# Patient Record
Sex: Female | Born: 1970 | Race: White | Hispanic: No | Smoking: Former smoker
Health system: Southern US, Community
[De-identification: ages and names within clinical notes are randomized; demographics above are authoritative.]

## PROBLEM LIST (undated history)

## (undated) DIAGNOSIS — J449 Chronic obstructive pulmonary disease, unspecified: Secondary | ICD-10-CM

## (undated) DIAGNOSIS — J45909 Unspecified asthma, uncomplicated: Secondary | ICD-10-CM

## (undated) DIAGNOSIS — F32A Depression, unspecified: Secondary | ICD-10-CM

## (undated) DIAGNOSIS — Z789 Other specified health status: Secondary | ICD-10-CM

## (undated) DIAGNOSIS — Z720 Tobacco use: Secondary | ICD-10-CM

## (undated) DIAGNOSIS — J189 Pneumonia, unspecified organism: Secondary | ICD-10-CM

## (undated) DIAGNOSIS — G4730 Sleep apnea, unspecified: Secondary | ICD-10-CM

## (undated) DIAGNOSIS — R0600 Dyspnea, unspecified: Secondary | ICD-10-CM

## (undated) HISTORY — PX: WISDOM TOOTH EXTRACTION: SHX21

## (undated) HISTORY — DX: Tobacco use: Z72.0

---

## 1998-04-21 ENCOUNTER — Other Ambulatory Visit: Admission: RE | Admit: 1998-04-21 | Discharge: 1998-04-21 | Payer: Self-pay | Admitting: Obstetrics

## 1998-04-21 ENCOUNTER — Ambulatory Visit (HOSPITAL_COMMUNITY): Admission: RE | Admit: 1998-04-21 | Discharge: 1998-04-21 | Payer: Self-pay | Admitting: Obstetrics

## 1998-08-03 ENCOUNTER — Inpatient Hospital Stay (HOSPITAL_COMMUNITY): Admission: AD | Admit: 1998-08-03 | Discharge: 1998-08-03 | Payer: Self-pay | Admitting: Obstetrics

## 1998-10-19 ENCOUNTER — Inpatient Hospital Stay (HOSPITAL_COMMUNITY): Admission: AD | Admit: 1998-10-19 | Discharge: 1998-10-21 | Payer: Self-pay | Admitting: Obstetrics

## 1999-12-06 ENCOUNTER — Other Ambulatory Visit: Admission: RE | Admit: 1999-12-06 | Discharge: 1999-12-06 | Payer: Self-pay | Admitting: Obstetrics

## 2000-04-06 ENCOUNTER — Observation Stay (HOSPITAL_COMMUNITY): Admission: EM | Admit: 2000-04-06 | Discharge: 2000-04-07 | Payer: Self-pay | Admitting: Psychiatry

## 2013-12-01 ENCOUNTER — Emergency Department (HOSPITAL_COMMUNITY)
Admission: EM | Admit: 2013-12-01 | Discharge: 2013-12-01 | Disposition: A | Discharge status: Home / Self Care | Payer: Medicaid Other | Attending: Emergency Medicine | Admitting: Emergency Medicine

## 2013-12-01 ENCOUNTER — Emergency Department (HOSPITAL_COMMUNITY): Payer: Medicaid Other

## 2013-12-01 ENCOUNTER — Encounter (HOSPITAL_COMMUNITY): Payer: Self-pay | Admitting: Emergency Medicine

## 2013-12-01 DIAGNOSIS — X500XXA Overexertion from strenuous movement or load, initial encounter: Secondary | ICD-10-CM | POA: Insufficient documentation

## 2013-12-01 DIAGNOSIS — S93402A Sprain of unspecified ligament of left ankle, initial encounter: Secondary | ICD-10-CM

## 2013-12-01 DIAGNOSIS — S99919A Unspecified injury of unspecified ankle, initial encounter: Secondary | ICD-10-CM

## 2013-12-01 DIAGNOSIS — F17200 Nicotine dependence, unspecified, uncomplicated: Secondary | ICD-10-CM | POA: Diagnosis not present

## 2013-12-01 DIAGNOSIS — Y9389 Activity, other specified: Secondary | ICD-10-CM | POA: Diagnosis not present

## 2013-12-01 DIAGNOSIS — S93409A Sprain of unspecified ligament of unspecified ankle, initial encounter: Secondary | ICD-10-CM | POA: Insufficient documentation

## 2013-12-01 DIAGNOSIS — Y9289 Other specified places as the place of occurrence of the external cause: Secondary | ICD-10-CM | POA: Insufficient documentation

## 2013-12-01 DIAGNOSIS — S99929A Unspecified injury of unspecified foot, initial encounter: Secondary | ICD-10-CM

## 2013-12-01 DIAGNOSIS — W19XXXA Unspecified fall, initial encounter: Secondary | ICD-10-CM

## 2013-12-01 DIAGNOSIS — S8990XA Unspecified injury of unspecified lower leg, initial encounter: Secondary | ICD-10-CM | POA: Insufficient documentation

## 2013-12-01 MED ORDER — HYDROCODONE-ACETAMINOPHEN 5-325 MG PO TABS
1.0000 | ORAL_TABLET | ORAL | Status: DC | PRN
Start: 2013-12-01 — End: 2015-01-27

## 2013-12-01 MED ORDER — HYDROCODONE-ACETAMINOPHEN 5-325 MG PO TABS
2.0000 | ORAL_TABLET | Freq: Once | ORAL | Status: AC
  Administered 2013-12-01: 2 via ORAL
  Filled 2013-12-01: qty 2

## 2013-12-01 NOTE — Progress Notes (Signed)
Orthopedic Tech Progress Note Patient Details:  Michele [name] 1971/01/24 [id number]  Ortho Devices Type of Ortho Device: CAM walker;Crutches Ortho Device/Splint Interventions: Application   Cammer, Theodoro Parma 12/01/2013, 2:20 PM

## 2013-12-01 NOTE — Discharge Instructions (Signed)
Take Vicodin for severe pain only. No driving or operating heavy machinery while taking vicodin. This medication may cause drowsiness.  Ankle Sprain An ankle sprain is an injury to the strong, fibrous tissues (ligaments) that hold the bones of your ankle joint together.  CAUSES An ankle sprain is usually caused by a fall or by twisting your ankle. Ankle sprains most commonly occur when you step on the outer edge of your foot, and your ankle turns inward. People who participate in sports are more prone to these types of injuries.  SYMPTOMS   Pain in your ankle. The pain may be present at rest or only when you are trying to stand or walk.  Swelling.  Bruising. Bruising may develop immediately or within 1 to 2 days after your injury.  Difficulty standing or walking, particularly when turning corners or changing directions. DIAGNOSIS  Your caregiver will ask you details about your injury and perform a physical exam of your ankle to determine if you have an ankle sprain. During the physical exam, your caregiver will press on and apply pressure to specific areas of your foot and ankle. Your caregiver will try to move your ankle in certain ways. An X-ray exam may be done to be sure a bone was not broken or a ligament did not separate from one of the bones in your ankle (avulsion fracture).  TREATMENT  Certain types of braces can help stabilize your ankle. Your caregiver can make a recommendation for this. Your caregiver may recommend the use of medicine for pain. If your sprain is severe, your caregiver may refer you to a surgeon who helps to restore function to parts of your skeletal system (orthopedist) or a physical therapist. [place] ice to your injury for 1-2 days or as directed by your caregiver. Applying ice helps to reduce inflammation and pain.  Put ice in a plastic bag.  Place a towel between your skin and the bag.  Leave the ice on for 15-20 minutes at a time,  every 2 hours while you are awake.  Only take over-the-counter or prescription medicines for pain, discomfort, or fever as directed by your caregiver.  Elevate your injured ankle above the level of your heart as much as possible for 2-3 days.  If your caregiver recommends crutches, use them as instructed. Gradually put weight on the affected ankle. Continue to use crutches or a cane until you can walk without feeling pain in your ankle.  If you have a plaster splint, wear the splint as directed by your caregiver. Do not rest it on anything harder than a pillow for the first 24 hours. Do not put weight on it. Do not get it wet. You may take it off to take a shower or bath.  You may have been given an elastic bandage to wear around your ankle to provide support. If the elastic bandage is too tight (you have numbness or tingling in your foot or your foot becomes cold and blue), adjust the bandage to make it comfortable.  If you have an air splint, you may blow more air into it or let air out to make it more comfortable. You may take your splint off at night and before taking a shower or bath. Wiggle your toes in the splint several times per day to decrease swelling. SEEK MEDICAL CARE IF:   You have rapidly increasing bruising or swelling.  Your toes feel extremely cold or you lose feeling in your  foot.  Your pain is not relieved with medicine. SEEK IMMEDIATE MEDICAL CARE IF:  Your toes are numb or blue.  You have severe pain that is increasing. MAKE SURE YOU:   Understand these instructions.  Will watch your condition.  Will get help right away if you are not doing well or get worse. Document Released: 03/06/2005 Document Revised: 11/29/2011 Document Reviewed: 03/18/2011 [facility] Patient Information 2015 [place], [place]. This information is not intended to replace advice given to you by your health care provider. Make sure you discuss any questions you have with your health care  provider. RICE: Routine Care for Injuries The routine care of many injuries includes Rest, Ice, Compression, and Elevation (RICE). HOME CARE INSTRUCTIONS  Rest is needed to allow your body to heal. Routine activities can usually be resumed when comfortable. Injured tendons and bones can take up to 6 weeks to heal. Tendons are the cord-like structures that attach muscle to bone.  Ice following an injury helps keep the swelling down and reduces pain.  Put ice in a plastic bag.  Place a towel between your skin and the bag.  Leave the ice on for 15-20 minutes, 3-4 times a day, or as directed by your health care provider. Do this while awake, for the first 24 to 48 hours. After that, continue as directed by your caregiver.  Compression helps keep swelling down. It also gives support and helps with discomfort. If an elastic bandage has been applied, it should be removed and reapplied every 3 to 4 hours. It should not be applied tightly, but firmly enough to keep swelling down. Watch fingers or toes for swelling, bluish discoloration, coldness, numbness, or excessive pain. If any of these problems occur, remove the bandage and reapply loosely. Contact your caregiver if these problems continue.  Elevation helps reduce swelling and decreases pain. With extremities, such as the arms, hands, legs, and feet, the injured area should be placed near or above the level of the heart, if possible. SEEK IMMEDIATE MEDICAL CARE IF:  You have persistent pain and swelling.  You develop redness, numbness, or unexpected weakness.  Your symptoms are getting worse rather than improving after several days. These symptoms may indicate that further evaluation or further X-rays are needed. Sometimes, X-rays may not show a small broken bone (fracture) until 1 week or 10 days later. Make a follow-up appointment with your caregiver. Ask when your X-ray results will be ready. Make sure you get your X-ray results. Document  Released: 06/18/2000 Document Revised: 03/11/2013 Document Reviewed: 08/05/2010 [facility] Patient Information 2015 [place], [place]. This information is not intended to replace advice given to you by your health care provider. Make sure you discuss any questions you have with your health care provider.

## 2013-12-01 NOTE — ED Notes (Signed)
Paged Ortho for crutches

## 2013-12-01 NOTE — ED Provider Notes (Signed)
CSN: [id number]     Arrival date & time 12/01/13  0623 History  This chart was scribed for non-physician practitioner, Michele Mcalpine, PA-C,working with Carmin Muskrat, MD, by Marlowe Kays, ED Scribe. This patient was seen in room TR05C/TR05C and the patient's care was started at 11:08 AM  Chief Complaint  Patient presents with  . Ankle Injury   HPI .HPI Comments:  Michele [name] is a 43 y.o. female who presents to the Emergency Department complaining of a left ankle injury she sustained after stepping into a hole last night and twisting it. She reports moderate to severe pain stating at rest it is 6/10. Pt states she has iced and elevated the foot since the incident. She denies numbness, tingling or weakness of the left ankle or foot.  History reviewed. No pertinent past medical history. History reviewed. No pertinent past surgical history. History reviewed. No pertinent family history. History  Substance Use Topics  . Smoking status: Current Every Day Smoker  . Smokeless tobacco: Not on file  . Alcohol Use: No   OB History   Grav Para Term Preterm Abortions TAB SAB Ect Mult Living                 Review of Systems  Musculoskeletal: Positive for arthralgias and joint swelling.  Skin: Negative for color change.  Neurological: Negative for weakness and numbness.  All other systems reviewed and are negative.   Allergies  Review of patient's allergies indicates no known allergies.  Home Medications   Prior to Admission medications   Medication Sig Start Date End Date Taking? Authorizing Provider  HYDROcodone-acetaminophen (NORCO/VICODIN) 5-325 MG per tablet Take 1-2 tablets by mouth every 4 (four) hours as needed. 12/01/13   Illene Labrador, PA-C   Triage Vitals: BP 147/104  Pulse 80  Temp(Src) 97.7 F (36.5 C) (Oral)  Resp 18  SpO2 99% Physical Exam  Nursing note and vitals reviewed. Constitutional: She is oriented to person, place, and time. She appears  well-developed and well-nourished. No distress.  HENT:  Head: Normocephalic and atraumatic.  Mouth/Throat: Oropharynx is clear and moist.  Eyes: Conjunctivae and EOM are normal.  Neck: Normal range of motion. Neck supple.  Cardiovascular: Normal rate, regular rhythm and normal heart sounds.   2+ DP and PT pulse.  Pulmonary/Chest: Effort normal and breath sounds normal. No respiratory distress.  Musculoskeletal: Normal range of motion. She exhibits no edema.  Mild swelling laterally to left ankle. ttp over lateral malleolus AITFL. No deformity or bruising. ROM limited secondary to pain. Achilles tendon normal.  Neurological: She is alert and oriented to person, place, and time. No sensory deficit.  Skin: Skin is warm and dry.  Psychiatric: She has a normal mood and affect. Her behavior is normal.    ED Course  Procedures (including critical care time) DIAGNOSTIC STUDIES: Oxygen Saturation is 99% on RA, normal by my interpretation.   COORDINATION OF CARE: 11:11 AM- Will wait for left ankle X-ray results. Pt verbalizes understanding and agrees to plan.  Medications  HYDROcodone-acetaminophen (NORCO/VICODIN) 5-325 MG per tablet 2 tablet (2 tablets Oral Given 12/01/13 1148)    Labs Review Labs Reviewed - No data to display  Imaging Review Dg Ankle Complete Left  12/01/2013   CLINICAL DATA:  Fall, posterior ankle pain  EXAM: LEFT ANKLE COMPLETE - 3+ VIEW  COMPARISON:  None.  FINDINGS: There is no evidence of fracture or dislocation. Mild lateral malleolar soft tissue swelling is evident. There is no evidence  of arthropathy or other focal bone abnormality. Soft tissues are unremarkable. Well corticated osseous fragments adjacent to the medial malleolus are identified.  IMPRESSION: Mild lateral malleolar soft tissue swelling. Radiographically occult fracture or ligamentous injury could account for this appearance.  Osteophytosis versus remote medial malleolar avulsion fractures.    Electronically Signed   By: Conchita Paris M.D.   On: 12/01/2013 11:22     EKG Interpretation None      MDM   Final diagnoses:  Left ankle sprain, initial encounter  Fall, initial encounter   Patient presenting with ankle pain after fall. Neurovascularly intact. X-ray showing mild lateral malleolar soft tissue swelling, radiographically occult fracture or ligamentous injury could account for this appearance, osteophytosis versus remote medial malleoli avulsion fractures. Will place patient in a Cam Walker and have her nonweightbearing with crutches until orthopedic followup. Discharge or pain medication. BP noted to be elevated, pt reports she is in pain, no chest pain, HA, sob, vision changes. Stable for discharge. Return precautions given. Patient states understanding of treatment care plan and is agreeable.  I personally performed the services described in this documentation, which was scribed in my presence. The recorded information has been reviewed and is accurate.    Illene Labrador, PA-C 12/02/13 [phone]

## 2013-12-01 NOTE — ED Notes (Signed)
Pt having left ankle pain since stepping in an whole yesterday. Some swelling noted.

## 2013-12-02 NOTE — ED Provider Notes (Signed)
  Medical screening examination/treatment/procedure(s) were performed by non-physician practitioner and as supervising physician I was immediately available for consultation/collaboration.     Carmin Muskrat, MD 12/02/13 1538

## 2015-01-27 ENCOUNTER — Emergency Department (HOSPITAL_COMMUNITY): Payer: Medicaid Other

## 2015-01-27 ENCOUNTER — Encounter (HOSPITAL_COMMUNITY): Payer: Self-pay

## 2015-01-27 ENCOUNTER — Emergency Department (HOSPITAL_COMMUNITY)
Admission: EM | Admit: 2015-01-27 | Discharge: 2015-01-27 | Disposition: A | Discharge status: Home / Self Care | Payer: Medicaid Other | Attending: Emergency Medicine | Admitting: Emergency Medicine

## 2015-01-27 DIAGNOSIS — Z720 Tobacco use: Secondary | ICD-10-CM | POA: Diagnosis not present

## 2015-01-27 DIAGNOSIS — N83209 Unspecified ovarian cyst, unspecified side: Secondary | ICD-10-CM | POA: Insufficient documentation

## 2015-01-27 DIAGNOSIS — R109 Unspecified abdominal pain: Secondary | ICD-10-CM | POA: Diagnosis present

## 2015-01-27 LAB — URINALYSIS, ROUTINE W REFLEX MICROSCOPIC
BILIRUBIN URINE: NEGATIVE
GLUCOSE, UA: NEGATIVE mg/dL
HGB URINE DIPSTICK: NEGATIVE
KETONES UR: NEGATIVE mg/dL
Leukocytes, UA: NEGATIVE
Nitrite: NEGATIVE
PH: 6 (ref 5.0–8.0)
Protein, ur: NEGATIVE mg/dL
SPECIFIC GRAVITY, URINE: 1.026 (ref 1.005–1.030)
Urobilinogen, UA: 0.2 mg/dL (ref 0.0–1.0)

## 2015-01-27 LAB — WET PREP, GENITAL
Clue Cells Wet Prep HPF POC: NONE SEEN
Trich, Wet Prep: NONE SEEN
Yeast Wet Prep HPF POC: NONE SEEN

## 2015-01-27 LAB — COMPREHENSIVE METABOLIC PANEL
ALBUMIN: 4.1 g/dL (ref 3.5–5.0)
ALT: 32 U/L (ref 14–54)
AST: 23 U/L (ref 15–41)
Alkaline Phosphatase: 76 U/L (ref 38–126)
Anion gap: 13 (ref 5–15)
BUN: 10 mg/dL (ref 6–20)
CHLORIDE: 98 mmol/L — AB (ref 101–111)
CO2: 25 mmol/L (ref 22–32)
CREATININE: 0.83 mg/dL (ref 0.44–1.00)
Calcium: 9.5 mg/dL (ref 8.9–10.3)
GFR calc Af Amer: >60 mL/min (ref >60)
GFR calc non Af Amer: >60 mL/min (ref >60)
GLUCOSE: 130 mg/dL — AB (ref 65–99)
Potassium: 4.5 mmol/L (ref 3.5–5.1)
Sodium: 136 mmol/L (ref 135–145)
Total Bilirubin: 0.7 mg/dL (ref 0.3–1.2)
Total Protein: 7 g/dL (ref 6.5–8.1)

## 2015-01-27 LAB — CBC
HEMATOCRIT: 48.8 % — AB (ref 36.0–46.0)
Hemoglobin: 16.3 g/dL — ABNORMAL HIGH (ref 12.0–15.0)
MCH: 31.6 pg (ref 26.0–34.0)
MCHC: 33.4 g/dL (ref 30.0–36.0)
MCV: 94.6 fL (ref 78.0–100.0)
PLATELETS: 195 10*3/uL (ref 150–400)
RBC: 5.16 MIL/uL — ABNORMAL HIGH (ref 3.87–5.11)
RDW: 13.9 % (ref 11.5–15.5)
WBC: 11.7 10*3/uL — ABNORMAL HIGH (ref 4.0–10.5)

## 2015-01-27 LAB — LIPASE, BLOOD: LIPASE: 26 U/L (ref 11–51)

## 2015-01-27 MED ORDER — IBUPROFEN 800 MG PO TABS: 800.0000 mg | ORAL_TABLET | Freq: Three times a day (TID) | ORAL | Status: DC

## 2015-01-27 MED ORDER — OXYCODONE-ACETAMINOPHEN 5-325 MG PO TABS: 1.0000 | ORAL_TABLET | ORAL | Status: DC | PRN

## 2015-01-27 MED ORDER — ONDANSETRON 4 MG PO TBDP
ORAL_TABLET | ORAL | Status: AC
  Filled 2015-01-27: qty 1

## 2015-01-27 MED ORDER — ONDANSETRON 4 MG PO TBDP
4.0000 mg | ORAL_TABLET | Freq: Once | ORAL | Status: AC | PRN
  Administered 2015-01-27: 4 mg via ORAL

## 2015-01-27 MED ORDER — SODIUM CHLORIDE 0.9 % IV BOLUS (SEPSIS)
1000.0000 mL | Freq: Once | INTRAVENOUS | Status: AC
  Administered 2015-01-27: 1000 mL via INTRAVENOUS

## 2015-01-27 MED ORDER — HYDROMORPHONE HCL 1 MG/ML IJ SOLN
1.0000 mg | Freq: Once | INTRAMUSCULAR | Status: AC
  Administered 2015-01-27: 1 mg via INTRAVENOUS
  Filled 2015-01-27: qty 1

## 2015-01-27 MED ORDER — KETOROLAC TROMETHAMINE 30 MG/ML IJ SOLN
30.0000 mg | Freq: Once | INTRAMUSCULAR | Status: AC
  Administered 2015-01-27: 30 mg via INTRAVENOUS
  Filled 2015-01-27: qty 1

## 2015-01-27 MED ORDER — ONDANSETRON HCL 4 MG/2ML IJ SOLN
4.0000 mg | Freq: Once | INTRAMUSCULAR | Status: AC
  Administered 2015-01-27: 4 mg via INTRAVENOUS
  Filled 2015-01-27: qty 2

## 2015-01-27 MED ORDER — HYDROMORPHONE HCL 1 MG/ML IJ SOLN
2.0000 mg | Freq: Once | INTRAMUSCULAR | Status: AC
  Administered 2015-01-27: 2 mg via INTRAVENOUS
  Filled 2015-01-27: qty 2

## 2015-01-27 NOTE — Discharge Instructions (Signed)
You have been seen today for abdominal and flank pain. Your lab tests showed no abnormalities. You need to follow up with OBGYN as soon as possible to discuss management of your ultrasound and CT findings. Follow up with PCP as needed. Return to ED should symptoms worsen.   Emergency Department Resource Guide 1) Find a Doctor and Pay Out of Pocket Although you won't have to find out who is covered by your insurance plan, it is a good idea to ask around and get recommendations. You will then need to call the office and see if the doctor you have chosen will accept you as a new patient and what types of options they offer for patients who are self-pay. Some doctors offer discounts or will set up payment plans for their patients who do not have insurance, but you will need to ask so you aren't surprised when you get to your appointment.  2) Contact Your Local Health Department Not all health departments have doctors that can see patients for sick visits, but many do, so it is worth a call to see if yours does. If you don't know where your local health department is, you can check in your phone book. The CDC also has a tool to help you locate your state's health department, and many state websites also have listings of all of their local health departments.  3) Find a [facility] If your illness is not likely to be very severe or complicated, you may want to try a walk in clinic. These are popping up all over the country in pharmacies, drugstores, and shopping centers. They're usually staffed by nurse practitioners or physician assistants that have been trained to treat common illnesses and complaints. They're usually fairly quick and inexpensive. However, if you have serious medical issues or chronic medical problems, these are probably not your best option.  No Primary Care Doctor: - Call Health Connect at  [phone] - they can help you locate a primary care doctor that  accepts your insurance,  provides certain services, etc. - Physician Referral Service- [phone]  Chronic Pain Problems: Organization         Address  Phone   Notes  [facility]  [phone] Patients need to be referred by their primary care doctor.   Medication Assistance: Organization         Address  Phone   Notes  [facility] [address] [place], [place] [address] [phone] --Must be a resident of [facility] -- Must have NO insurance coverage whatsoever (no Medicaid/ Medicare, etc.) -- The pt. MUST have a primary care doctor that directs their care regularly and follows them in the community   Med[facility]ist  [phone]   [employer]  [phone]    Agencies that provide inexpensive medical care: Organization         Address  Phone   Notes  [facility]  7204399255   [facility]    [phone]   [facility] [place],  [phone]   [place] [address] [place] [phone]   Planned Parenthood    [phone]   [facility]    [phone]   [place] and [place] [address], [place] Phone:  [phone], Fax:  [phone] Hours of Operation:  9 am -  6 pm, M-F.  Also accepts Medicaid/Medicare and self-pay.  [facility] for Laurie [place], [address], [place] Phone: [phone], Fax: [phone]. Hours of Operation:  8:30 am - 5:30 pm, M-F.  Also accepts Medicaid and self-pay.  [facility] [place] [address], [place] Phone: [phone]   [place], [place], [place] [phone], Ext. [phone] Mondays & Thursdays: 7-9 AM.  First 15 patients are seen on a first come, first serve basis.    [place] Providers:  Organization         Address  Phone    Notes  [facility] [address], [place] [phone] Also accepts self-pay patients.  [facility] [address], [place]  [phone]   [place], [address], [place] [phone]   [facility] [address] [place] [phone]   Lucianne Lei [address], [place]   [phone] Only accepts [place] Access [place] patients after they have their name applied to their card.   Self-Pay (no insurance) in [facility]:  Organization         Address  Phone   Notes  Sickle Cell Patients, [facility] [place] [phone]   [facility] [place] [phone]   [facility] [place]  [place], [place], [place] [phone]   [facility]/Dr. Osei-Bonsu  [address], [place] or [address], [facility] [phone] Phone number for both [place] and [place] locations is the same.  Urgent Medical and [facility] [address] [place] [phone]   [facility] [address], [place] or [address] [phone]   [facility] [address] [place] [phone], phone; [phone], fax Sees patients 1st and 3rd Saturday of every month.  Must not qualify for public or private insurance (i.e. Medicaid, Medicare, [place] Health Choice, Veterans' Benefits)  Household income should be no more than 200% of the poverty level The clinic cannot treat you if you are pregnant or think you are pregnant  Sexually transmitted diseases are not treated at the clinic.    Dental Care: Organization         Address  Phone  Notes  [facility] Department of [facility] [place] [phone] Accepts children up to age 96 who are enrolled in [place] or [place]; pregnant women with a Medicaid card; and children who have applied for Medicaid or [place] Health Choice, but were declined, whose parents can pay a reduced fee at time of service.  [facility] Department of [facility]  [address], [place] [phone] Accepts children up to age 52 who are enrolled in [place] or [place]; pregnant women with a Medicaid card; and children who have applied for Medicaid or [place] Health Choice, but were declined, whose parents can pay a reduced fee at time of service.  [place] Adult [facility] Access PROGRAM  [place] [phone] Patients are seen by appointment only. Walk-ins are not accepted. [place] will see patients 65 years of age and  older. Monday - Tuesday (8am-5pm) Most Wednesdays (8:30-5pm) $30 per visit, cash only  [facility] Adult [employer] PROGRAM  [address], [facility] [phone] Patients are seen by appointment only. Walk-ins are not accepted. [place] will see patients 46 years of age and older. One Wednesday Evening (Monthly: Volunteer Based).  $30 per visit, cash only  [place]  [phone] for adults; Children under age 44, call Graduate Pediatric Dentistry at [phone]. Children aged 62-14, please call [phone] to request a pediatric application.  Dental services are provided in all areas of dental care including fillings, crowns and bridges, complete and partial dentures, implants, gum treatment, root canals, and extractions. Preventive care is also provided. Treatment is provided to both adults and children. Patients are selected via a lottery and there is often a waiting list.   [facility] [address] [place]  [phone] [email or web address]   [facility] [address] [place], [place] [phone], Ext.  [phone] Second and Fourth Thursday of each month, opens at 6:30 AM; Clinic ends at 9 AM.  Patients are seen on a first-come first-served basis, and a limited number are seen during each clinic.   [facility]  [address] [place] Danker [place], [place] [phone]   Eligibility Requirements You must have lived in [place], [place], or [place] counties for at least the last three months.   You cannot be eligible for state or federal sponsored [employer], including [employer], [place], or [employer].   You generally cannot be eligible for healthcare insurance through your employer.    How to apply: Eligibility screenings are held every Tuesday and Wednesday afternoon from 1:00 pm until 4:00 pm. You do not need an appointment for the interview!  [facility] [address] [place], [place]   [place]  [place] Department  [place]  [phone]    Behavioral Health Resources in the Community: Intensive Outpatient Programs Organization         Address  Phone  Notes  [place]ia [place]. [address] [place], [place] [phone]   [facility]patient [address], [place], [place]   ADS: Alcohol & Drug Svcs [address] [place], [place]   [place] [address],  [place], [place] or [phone]   Substance Abuse Resources Organization         Address  Phone  Notes  Alcohol and Drug Services  [phone]   [place]  [phone]   [facility] [place]   Chinita Pester  [phone]   Residential & Outpatient Substance Abuse Program  [phone]   Psychological Services Organization         Address  Phone  Notes  [facility]  [place]  [phone]   [place] [address], [place] [phone] or [phone]    Mobile Crisis Teams Organization         Address  Phone  Notes  [facility], Mobile Crisis Care Unit  [phone]   Assertive Psychotherapeutic Services  [address] [place], [place]   [facility] [address], [place] [phone]    Self-Help/Support Groups Organization         Address  Phone             Notes  [facility]ental [facility]oc[facility] of [place] - variety of support groups  [place] Call for more information  Narcotics Anonymous (NA), [facility] [address], [employer] [place]  2 meetings at this location   Special educational needs teacher         Address  Phone  Notes  [facility]idential [facility] [place],    [place]  [phone]   [facility]  [address], [place] [address], [place], [place]   [place], [place] [phone] Admissions: 8am-3pm M-F  Incentives Substance [place] [address]    [place], [place] [phone]   [facility] [address] [place], [place], [place]   [facility] [address],  [place], [place]   [facility] Programs - Intensive Outpatient [address], [place], [place]   [facility] [place].,  [place], [place] [phone] or [phone]   [facility] Treatment Services (RTS) [address] [place], [place] Accepts Medicaid  [facility] [place] [address],  [place] [phone] Substance Abuse/Addiction Treatment   [facility] Organization         Address  Phone  Notes  [facility]  [phone]   Domenic Schwab, PhD [address] Arlis Porta [place], [place]   [phone] or [phone]   [place]  [place], [place] [phone]   [facility] [address] [place], [place] [phone] Insurance/Medicaid/sponsorship through [facility] Families [address] [place]                                    [place], [place] [phone] [place] [address], [place] [phone]    Dr. Adele Schilder  [phone]   [facility] Dept. 1) [address] [place] 2) [place] 3)  [place] [address], [place] [phone]  [phone]   [place] [phone] or [phone] (After Hours)

## 2015-01-27 NOTE — ED Provider Notes (Signed)
  Face-to-face evaluation   History: Left lower abdominal pain, started today. No recent menses. No vaginal bleeding.  Physical exam: Alert, calm, cooperative. Mild left lower quadrant abdominal pain. No abdominal distention.  Medical screening examination/treatment/procedure(s) were conducted as a shared visit with non-physician practitioner(s) and myself.  I personally evaluated the patient during the encounter  Daleen Bo, MD 01/28/15 [place] [place]

## 2015-01-27 NOTE — ED Notes (Signed)
Patient transported to Ultrasound 

## 2015-01-27 NOTE — ED Notes (Signed)
Lrt. Side abdominal pain began yesterday, pain was intermittent and today it is steady.  Pt. Is also nauseated and vomiting.  Pt. Reports the pain is very sharp.  She denies any urinary problems or vaginal issue. Last Bm was today normal  . No blood noted.

## 2015-01-27 NOTE — ED Provider Notes (Signed)
CSN: [id number]     Arrival date & time 01/27/15  1152 History   First MD Initiated Contact with Patient 01/27/15 1334     Chief Complaint  Patient presents with  . Abdominal Pain     (Consider location/radiation/quality/duration/timing/severity/associated sxs/prior Treatment) HPI   Michele [name] is a 44 y.o. female, with no pertinent past medical history, presenting to the ED with constant left flank pain that began yesterday. Pt has had 2 episodes of vomiting past 24 hours. States pain is sharp, 10/10, non-radiating. Pt had a normal BM today. Denies diarrhea/constipation, urinary symptoms, fever/chills, hematochezia, vaginal bleeding/discharge, or any other pain or complaints. Pt has tried taking tramadol yesterday with no relief.   History reviewed. No pertinent past medical history. No past surgical history on file. No family history on file. Social History  Substance Use Topics  . Smoking status: Current Every Day Smoker    Types: Cigarettes  . Smokeless tobacco: None  . Alcohol Use: Yes     Comment: occassional   OB History    No data available     Review of Systems    Allergies  Review of patient's allergies indicates no known allergies.  Home Medications   Prior to Admission medications   Medication Sig Start Date End Date Taking? Authorizing Provider  traMADol (ULTRAM) 50 MG tablet Take 50 mg by mouth every 6 (six) hours as needed for moderate pain.   Yes Historical Provider, MD  ibuprofen (ADVIL,MOTRIN) 800 MG tablet Take 1 tablet (800 mg total) by mouth 3 (three) times daily. 01/27/15   Shawn C Joy, PA-C  oxyCODONE-acetaminophen (PERCOCET/ROXICET) 5-325 MG tablet Take 1-2 tablets by mouth every 4 (four) hours as needed for severe pain. 01/27/15   Shawn C Joy, PA-C   BP 139/88 mmHg  Pulse 80  Temp(Src) 97.9 F (36.6 C) (Oral)  Resp 20  Ht 5\' 2"  (1.575 m)  Wt 175 lb (79.379 kg)  BMI 32.00 kg/m2  SpO2 91%  LMP 03/20/2009 (Within Years) Physical Exam   Constitutional: She appears well-developed and well-nourished. No distress.  Pt appears uncomfortable and in pain. Pt can not sit still on the bed and is tearful.   HENT:  Head: Normocephalic and atraumatic.  Eyes: Conjunctivae are normal. Pupils are equal, round, and reactive to light.  Cardiovascular: Normal rate, regular rhythm and normal heart sounds.   Pulmonary/Chest: Effort normal and breath sounds normal. No respiratory distress.  Abdominal: Soft. Normal appearance and bowel sounds are normal. There is tenderness in the left upper quadrant and left lower quadrant. There is guarding. There is no tenderness at McBurney's point and negative Murphy's sign.  Genitourinary: Pelvic exam was performed with patient supine. There is no rash, tenderness or lesion on the right labia. There is no rash, tenderness or lesion on the left labia. Cervix exhibits no motion tenderness and no friability. Right adnexum displays no mass, no tenderness and no fullness. Left adnexum displays no mass, no tenderness and no fullness.  Thin, milky white discharge noted on pelvic exam. RN served as chaperone during exam. No other abnormalities found. Otherwise normal female genitalia.   Musculoskeletal: She exhibits no edema or tenderness.  Neurological: She is alert.  Skin: Skin is warm and dry. She is not diaphoretic.  Nursing note and vitals reviewed.   ED Course  Pelvic exam Date/Time: 01/27/2015 4:56 PM Performed by: Lorayne Bender Authorized by: Arlean Hopping C Consent: Verbal consent obtained. Risks and benefits: risks, benefits and alternatives were  discussed Consent given by: patient Patient understanding: patient states understanding of the procedure being performed Patient consent: the patient's understanding of the procedure matches consent given Procedure consent: procedure consent matches procedure scheduled Required items: required blood products, implants, devices, and special equipment  available Patient identity confirmed: verbally with patient and arm band Time out: Immediately prior to procedure a "time out" was called to verify the correct patient, procedure, equipment, support staff and site/side marked as required. Local anesthesia used: no Patient sedated: no Patient tolerance: Patient tolerated the procedure well with no immediate complications   (including critical care time) Labs Review Labs Reviewed  WET PREP, GENITAL - Abnormal; Notable for the following:    WBC, Wet Prep HPF POC FEW (*)    All other components within normal limits  COMPREHENSIVE METABOLIC PANEL - Abnormal; Notable for the following:    Chloride 98 (*)    Glucose, Bld 130 (*)    All other components within normal limits  CBC - Abnormal; Notable for the following:    WBC 11.7 (*)    RBC 5.16 (*)    Hemoglobin 16.3 (*)    HCT 48.8 (*)    All other components within normal limits  URINALYSIS, ROUTINE W REFLEX MICROSCOPIC (NOT AT [facility]) - Abnormal; Notable for the following:    APPearance CLOUDY (*)    All other components within normal limits  LIPASE, BLOOD  RPR  HIV ANTIBODY (ROUTINE TESTING)  GC/CHLAMYDIA PROBE AMP ([place]) NOT AT [facility]    Imaging Review US Transvaginal Non-ob  01/27/2015  CLINICAL DATA:  Followup for an abnormal CT which showed a heterogeneous mass extending from just above the bladder and to the lower abdomen centrally. Patient complaining of left flank pain for 2 days with nausea. EXAM: TRANSABDOMINAL AND TRANSVAGINAL ULTRASOUND OF PELVIS TECHNIQUE: Both transabdominal and transvaginal ultrasound examinations of the pelvis were performed. Transabdominal technique was performed for global imaging of the pelvis including uterus, ovaries, adnexal regions, and pelvic cul-de-sac. It was necessary to proceed with endovaginal exam following the transabdominal exam to visualize the uterus, endometrium and adnexa to better advantage. COMPARISON:  Current abdomen and pelvis  CT FINDINGS: Uterus Measurements: 6.1 x 3.2 x 4.0 cm. No fibroids or other mass visualized. Endometrium Thickness: 2.9 mm.  No focal abnormality visualized. Right ovary No normal ovary visualized. There is a complex mass measuring 7.6 x 6.0 x 5.5 cm, which appears separate from the uterus. No convincing blood flow seen within this. No other right adnexal abnormality. Left ovary Measurements: 5.2 x 4.5 x 3.6 cm. Ovary is heterogeneous in echogenicity. There is a cyst with relatively thin septations measuring 3.1 x 2.6 x 2.6 cm. Blood flow was seen within the ovarian tissue on color Doppler analysis. No adnexal masses. Other findings Large amount pelvic free fluid. IMPRESSION: 1. Mass seen on current CT appears is a heterogeneous solid right adnexal mass on ultrasound, which does not appear to arise from the uterus. There is no definite blood flow, but this evaluation is somewhat limited. No normal right ovary seen. Possible etiology includes a hematoma involving the right ovary possibly from a ruptured hemorrhagic cyst. This could reflect a solid ovarian mass therefore neoplastic disease is possible. This be further characterized, if desired clinically, with pelvic MRI with and without contrast. 2. Somewhat abnormal appearing left ovary with 3.1 cm cyst. 3. Large amount pelvic free fluid. This be the result of hemorrhage from a ruptured hemorrhagic ovarian cyst. 4. Normal uterus. Electronically Signed  By: Lajean Manes M.D.   On: 01/27/2015 19:26   US Pelvis Complete  01/27/2015  CLINICAL DATA:  Followup for an abnormal CT which showed a heterogeneous mass extending from just above the bladder and to the lower abdomen centrally. Patient complaining of left flank pain for 2 days with nausea. EXAM: TRANSABDOMINAL AND TRANSVAGINAL ULTRASOUND OF PELVIS TECHNIQUE: Both transabdominal and transvaginal ultrasound examinations of the pelvis were performed. Transabdominal technique was performed for global imaging of the  pelvis including uterus, ovaries, adnexal regions, and pelvic cul-de-sac. It was necessary to proceed with endovaginal exam following the transabdominal exam to visualize the uterus, endometrium and adnexa to better advantage. COMPARISON:  Current abdomen and pelvis CT FINDINGS: Uterus Measurements: 6.1 x 3.2 x 4.0 cm. No fibroids or other mass visualized. Endometrium Thickness: 2.9 mm.  No focal abnormality visualized. Right ovary No normal ovary visualized. There is a complex mass measuring 7.6 x 6.0 x 5.5 cm, which appears separate from the uterus. No convincing blood flow seen within this. No other right adnexal abnormality. Left ovary Measurements: 5.2 x 4.5 x 3.6 cm. Ovary is heterogeneous in echogenicity. There is a cyst with relatively thin septations measuring 3.1 x 2.6 x 2.6 cm. Blood flow was seen within the ovarian tissue on color Doppler analysis. No adnexal masses. Other findings Large amount pelvic free fluid. IMPRESSION: 1. Mass seen on current CT appears is a heterogeneous solid right adnexal mass on ultrasound, which does not appear to arise from the uterus. There is no definite blood flow, but this evaluation is somewhat limited. No normal right ovary seen. Possible etiology includes a hematoma involving the right ovary possibly from a ruptured hemorrhagic cyst. This could reflect a solid ovarian mass therefore neoplastic disease is possible. This be further characterized, if desired clinically, with pelvic MRI with and without contrast. 2. Somewhat abnormal appearing left ovary with 3.1 cm cyst. 3. Large amount pelvic free fluid. This be the result of hemorrhage from a ruptured hemorrhagic ovarian cyst. 4. Normal uterus. Electronically Signed   By: Lajean Manes M.D.   On: 01/27/2015 19:26   Ct Renal Stone Study  01/27/2015  CLINICAL DATA:  One day history of left flank pain with nausea EXAM: CT ABDOMEN AND PELVIS WITHOUT CONTRAST TECHNIQUE: Multidetector CT imaging of the abdomen and pelvis  was performed following the standard protocol without oral or intravenous contrast material administration. COMPARISON:  None. FINDINGS: Lower chest: There is mild bibasilar atelectasis. Lung bases are otherwise clear. Hepatobiliary: Liver is prominent, measuring 17.3 cm in length. No focal liver lesions are identified on this noncontrast enhanced study. The gallbladder wall is not thickened. There is no biliary duct dilatation. Pancreas: There is no pancreatic mass or inflammatory focus. Spleen: No splenic lesions are identified. Adrenals/Urinary Tract: Adrenals appear unremarkable bilaterally. Kidneys bilaterally show no mass or hydronephrosis on either side. There is no renal or ureteral calculus on either side. The urinary bladder is midline with wall thickness within normal limits. Stomach/Bowel: There is no appreciable bowel wall or mesenteric thickening. There are occasional sigmoid diverticula without apparent diverticulitis. Vascular/Lymphatic: There is atherosclerotic calcification in aorta and proximal common iliac arteries without aneurysm. No vascular lesions are identified on this noncontrast enhanced study. No adenopathy is appreciable in the abdomen pelvis. Reproductive: The uterus is enlarged and diffusely irregular in contour consistent with extensive leiomyomatous change. There is fluid surrounding the lower uterine segment with fluid seen between the uterus and urinary bladder. Appendix appears unremarkable. No abscess is  seen in the abdomen or pelvis. Musculoskeletal: There are no blastic or lytic bone lesions. No intramuscular or abdominal wall lesion appreciable. IMPRESSION: Diffusely enlarged, inhomogeneous uterus consistent with leiomyomatous change. There is somewhat irregular fluid tracking adjacent to the lower uterine segment and located slightly superior to the urinary bladder. Question recent ovarian cyst rupture with hemorrhage. Pelvic inflammatory disease is a differential  consideration given this appearance. Appendix appears normal. No abscess. No bowel obstruction. There are a few sigmoid diverticula without diverticulitis. Liver prominent without focal lesion on this noncontrast enhanced study. No renal or ureteral calculus.  No hydronephrosis. Electronically Signed   By: Lowella Grip III M.D.   On: 01/27/2015 15:52   I have personally reviewed and evaluated these images and lab results as part of my medical decision-making.   EKG Interpretation None      MDM   Final diagnoses:  Left flank pain  Hemorrhagic ovarian cyst  Ruptured ovarian cyst    CHARNELLE BERGEMAN presents with left flank and LUQ and LLQ abdominal pain.   Findings and plan of care discussed with Daleen Bo, MD and then with Frances Nickels, MD after EDP shift change.   Suspect kidney stone vs ovarian cyst. Torsion is less likely due to length of time pain has been occurring. Pt noted to be hypertensive, but pt states her BP runs high and she is not taking BP medication.  2:41 PM Pt reassessed. States she still has significant pain, rated at 9/10. Nausea has resolved.  Pt still has not eased off. CT results reveal likely ovarian cyst rupture vs PID. Will perform pelvic exam with GC/Chlamydia and wet prep, and then order a pelvic ultrasound.  Ultrasound reveals hemorrhagic cyst on the right as well as secondary ovarian cyst on the left. Findings require follow-up outpatient with OB/GYN as soon as possible. This plan of care was passed on to the patient and her husband, both parties agreed to the plan and are comfortable with discharge.  Lorayne Bender, PA-C 01/27/15 [place], MD 01/28/15 Curly Rim

## 2015-01-28 LAB — GC/CHLAMYDIA PROBE AMP (~~LOC~~) NOT AT ARMC
CHLAMYDIA, DNA PROBE: NEGATIVE
NEISSERIA GONORRHEA: NEGATIVE

## 2015-01-28 LAB — HIV ANTIBODY (ROUTINE TESTING W REFLEX): HIV Screen 4th Generation wRfx: NONREACTIVE

## 2015-01-28 LAB — RPR: RPR: NONREACTIVE

## 2015-02-05 ENCOUNTER — Inpatient Hospital Stay (HOSPITAL_COMMUNITY): Payer: Medicaid Other

## 2015-02-05 ENCOUNTER — Inpatient Hospital Stay (HOSPITAL_COMMUNITY)
Admission: AD | Admit: 2015-02-05 | Discharge: 2015-02-05 | Disposition: A | Discharge status: Home / Self Care | Payer: Medicaid Other | Source: Ambulatory Visit | Attending: Obstetrics and Gynecology | Admitting: Obstetrics and Gynecology

## 2015-02-05 ENCOUNTER — Encounter (HOSPITAL_COMMUNITY): Payer: Self-pay

## 2015-02-05 DIAGNOSIS — D259 Leiomyoma of uterus, unspecified: Secondary | ICD-10-CM | POA: Insufficient documentation

## 2015-02-05 DIAGNOSIS — F17210 Nicotine dependence, cigarettes, uncomplicated: Secondary | ICD-10-CM | POA: Diagnosis not present

## 2015-02-05 DIAGNOSIS — R102 Pelvic and perineal pain: Secondary | ICD-10-CM | POA: Diagnosis present

## 2015-02-05 DIAGNOSIS — R1900 Intra-abdominal and pelvic swelling, mass and lump, unspecified site: Secondary | ICD-10-CM

## 2015-02-05 HISTORY — DX: Other specified health status: Z78.9

## 2015-02-05 LAB — URINALYSIS, ROUTINE W REFLEX MICROSCOPIC
BILIRUBIN URINE: NEGATIVE
GLUCOSE, UA: NEGATIVE mg/dL
KETONES UR: NEGATIVE mg/dL
Leukocytes, UA: NEGATIVE
Nitrite: NEGATIVE
PH: 6 (ref 5.0–8.0)
Protein, ur: NEGATIVE mg/dL
Specific Gravity, Urine: >1.03 — ABNORMAL HIGH (ref 1.005–1.030)

## 2015-02-05 LAB — URINE MICROSCOPIC-ADD ON

## 2015-02-05 LAB — COMPREHENSIVE METABOLIC PANEL
ALK PHOS: 88 U/L (ref 38–126)
ALT: 20 U/L (ref 14–54)
ANION GAP: 13 (ref 5–15)
AST: 14 U/L — ABNORMAL LOW (ref 15–41)
Albumin: 3.7 g/dL (ref 3.5–5.0)
BUN: 8 mg/dL (ref 6–20)
CALCIUM: 9 mg/dL (ref 8.9–10.3)
CO2: 24 mmol/L (ref 22–32)
Chloride: 101 mmol/L (ref 101–111)
Creatinine, Ser: 0.69 mg/dL (ref 0.44–1.00)
GFR calc non Af Amer: >60 mL/min (ref >60)
Glucose, Bld: 102 mg/dL — ABNORMAL HIGH (ref 65–99)
Potassium: 3.9 mmol/L (ref 3.5–5.1)
SODIUM: 138 mmol/L (ref 135–145)
Total Bilirubin: 0.6 mg/dL (ref 0.3–1.2)
Total Protein: 7.3 g/dL (ref 6.5–8.1)

## 2015-02-05 LAB — CBC WITH DIFFERENTIAL/PLATELET
Basophils Absolute: 0 10*3/uL (ref 0.0–0.1)
Basophils Relative: 0 %
EOS ABS: 0.2 10*3/uL (ref 0.0–0.7)
EOS PCT: 1 %
HCT: 35.3 % — ABNORMAL LOW (ref 36.0–46.0)
HEMOGLOBIN: 11.6 g/dL — AB (ref 12.0–15.0)
LYMPHS ABS: 2.4 10*3/uL (ref 0.7–4.0)
Lymphocytes Relative: 18 %
MCH: 31 pg (ref 26.0–34.0)
MCHC: 32.9 g/dL (ref 30.0–36.0)
MCV: 94.4 fL (ref 78.0–100.0)
MONO ABS: 1 10*3/uL (ref 0.1–1.0)
MONOS PCT: 7 %
Neutro Abs: 10 10*3/uL — ABNORMAL HIGH (ref 1.7–7.7)
Neutrophils Relative %: 74 %
PLATELETS: 362 10*3/uL (ref 150–400)
RBC: 3.74 MIL/uL — ABNORMAL LOW (ref 3.87–5.11)
RDW: 14.4 % (ref 11.5–15.5)
WBC: 13.6 10*3/uL — ABNORMAL HIGH (ref 4.0–10.5)

## 2015-02-05 LAB — POCT PREGNANCY, URINE: Preg Test, Ur: NEGATIVE

## 2015-02-05 MED ORDER — OXYCODONE-ACETAMINOPHEN 5-325 MG PO TABS
2.0000 | ORAL_TABLET | ORAL | Status: AC
  Administered 2015-02-05: 2 via ORAL
  Filled 2015-02-05: qty 2

## 2015-02-05 MED ORDER — KETOROLAC TROMETHAMINE 60 MG/2ML IM SOLN
60.0000 mg | Freq: Once | INTRAMUSCULAR | Status: AC
  Administered 2015-02-05: 60 mg via INTRAMUSCULAR
  Filled 2015-02-05: qty 2

## 2015-02-05 MED ORDER — OXYCODONE-ACETAMINOPHEN 5-325 MG PO TABS: 1.0000 | ORAL_TABLET | ORAL | Status: DC | PRN

## 2015-02-05 NOTE — MAU Note (Signed)
abd pain, started 9 days agok, was seen at [facility].  Initially pain was just on left side; now it is across the abd. Getting worse.  Has appt on the 29th, doesn't feel she can wait, pain is affecting her life.  Was told she had a couple of cysts, one had ruptured.

## 2015-02-05 NOTE — MAU Provider Note (Signed)
History     CSN: [id number]  Arrival date and time: 02/05/15 [id number]   First Provider Initiated Contact with Patient 02/05/15 [phone]      Chief Complaint  Patient presents with  . Abdominal Pain   HPI Michele [name] 44 y.o. [id number] nonpregnant female presents for abdominal pain.  It started on 01/26/15 and was located LLQ at that time.  She went to ED on 11/9 and was evaluated at that time.  She was given rx for percocet scheduled for GYN f/u Ruthann Cancer) on 11/29.  SInce seen in ED she notes the pain has moved and is across entire abdomen with significant bloating.  Pain is constant and worsening, described as dull and achy.  It is not always severe but does not go away.  Pain worse with movement, cough, passing gas.  Nothing alleviates pain.  She was able to work yesterday but could not today.  Reports nausea after meals, decreased appetite.  She feels pressure on her bladder and had urinary incontinence once today.  Denies fever, chills, a   She was taking percocet from the ED which caused constipation.  She took a stool softener and then had diarrhea once this am.  She has not had a menstrual period in 4-5 years and has not had evaluation for this.  She assumed that it was to be expected since she started her period early.   OB History    Gravida Para Term Preterm AB TAB SAB Ectopic Multiple Living   2 2 2  0 0 0 0 0 0 2      Past Medical History  Diagnosis Date  . Medical history non-contributory     Past Surgical History  Procedure Laterality Date  . Cesarean section      Family History  Problem Relation Age of Onset  . Cancer Mother   . Cancer Maternal Aunt   . Cancer Maternal Uncle     Social History  Substance Use Topics  . Smoking status: Current Every Day Smoker -- 0.50 packs/day    Types: Cigarettes  . Smokeless tobacco: Never Used  . Alcohol Use: Yes     Comment: occassional    Allergies: No Known Allergies  Prescriptions prior to admission  Medication Sig  Dispense Refill Last Dose  . ibuprofen (ADVIL,MOTRIN) 800 MG tablet Take 1 tablet (800 mg total) by mouth 3 (three) times daily. 21 tablet 0 02/03/2015 at Unknown time  . oxyCODONE-acetaminophen (PERCOCET/ROXICET) 5-325 MG tablet Take 1-2 tablets by mouth every 4 (four) hours as needed for severe pain. 15 tablet 0 02/04/2015  . traMADol (ULTRAM) 50 MG tablet Take 50 mg by mouth every 6 (six) hours as needed for moderate pain.   02/01/2015    ROS Pertinent ROS in HPI.  All other systems are negative.   Physical Exam   Blood pressure 131/86, pulse 97, temperature 98.5 F (36.9 C), temperature source Oral, resp. rate 22, height 5\' 2"  (1.575 m), weight 175 lb (79.379 kg), last menstrual period 03/20/2009, SpO2 96 %.  Physical Exam  Constitutional: She is oriented to person, place, and time. She appears well-developed and well-nourished.  HENT:  Head: Normocephalic and atraumatic.  Eyes: Conjunctivae and EOM are normal.  Cardiovascular: Normal rate, regular rhythm and normal heart sounds.   Respiratory: Effort normal and breath sounds normal. No respiratory distress.  GI: She exhibits distension. She exhibits no mass. There is tenderness. There is no rebound and no guarding.  Neg CVA tenderness Bowel sounds hypoactive  Musculoskeletal: Normal range of motion.  Neurological: She is alert and oriented to person, place, and time.  Skin: Skin is warm and dry.  Psychiatric: She has a normal mood and affect. Her behavior is normal.   Results for orders placed or performed during the hospital encounter of 02/05/15 (from the past 24 hour(s))  Urinalysis, Routine w reflex microscopic (not at [facility])     Status: Abnormal   Collection Time: 02/05/15  9:10 AM  Result Value Ref Range   Color, Urine YELLOW YELLOW   APPearance CLEAR CLEAR   Specific Gravity, Urine >1.030 (H) 1.005 - 1.030   pH 6.0 5.0 - 8.0   Glucose, UA NEGATIVE NEGATIVE mg/dL   Hgb urine dipstick TRACE (A) NEGATIVE   Bilirubin  Urine NEGATIVE NEGATIVE   Ketones, ur NEGATIVE NEGATIVE mg/dL   Protein, ur NEGATIVE NEGATIVE mg/dL   Nitrite NEGATIVE NEGATIVE   Leukocytes, UA NEGATIVE NEGATIVE  Urine microscopic-add on     Status: Abnormal   Collection Time: 02/05/15  9:10 AM  Result Value Ref Range   Squamous Epithelial / LPF 0-5 (A) NONE SEEN   WBC, UA 0-5 0 - 5 WBC/hpf   RBC / HPF 0-5 0 - 5 RBC/hpf   Bacteria, UA RARE (A) NONE SEEN  Pregnancy, urine POC     Status: None   Collection Time: 02/05/15  9:36 AM  Result Value Ref Range   Preg Test, Ur NEGATIVE NEGATIVE  CBC with Differential/Platelet     Status: Abnormal   Collection Time: 02/05/15 10:00 AM  Result Value Ref Range   WBC 13.6 (H) 4.0 - 10.5 K/uL   RBC 3.74 (L) 3.87 - 5.11 MIL/uL   Hemoglobin 11.6 (L) 12.0 - 15.0 g/dL   HCT 35.3 (L) 36.0 - 46.0 %   MCV 94.4 78.0 - 100.0 fL   MCH 31.0 26.0 - 34.0 pg   MCHC 32.9 30.0 - 36.0 g/dL   RDW 14.4 11.5 - 15.5 %   Platelets 362 150 - 400 K/uL   Neutrophils Relative % 74 %   Neutro Abs 10.0 (H) 1.7 - 7.7 K/uL   Lymphocytes Relative 18 %   Lymphs Abs 2.4 0.7 - 4.0 K/uL   Monocytes Relative 7 %   Monocytes Absolute 1.0 0.1 - 1.0 K/uL   Eosinophils Relative 1 %   Eosinophils Absolute 0.2 0.0 - 0.7 K/uL   Basophils Relative 0 %   Basophils Absolute 0.0 0.0 - 0.1 K/uL  Comprehensive metabolic panel     Status: Abnormal   Collection Time: 02/05/15 10:00 AM  Result Value Ref Range   Sodium 138 135 - 145 mmol/L   Potassium 3.9 3.5 - 5.1 mmol/L   Chloride 101 101 - 111 mmol/L   CO2 24 22 - 32 mmol/L   Glucose, Bld 102 (H) 65 - 99 mg/dL   BUN 8 6 - 20 mg/dL   Creatinine, Ser 0.69 0.44 - 1.00 mg/dL   Calcium 9.0 8.9 - 10.3 mg/dL   Total Protein 7.3 6.5 - 8.1 g/dL   Albumin 3.7 3.5 - 5.0 g/dL   AST 14 (L) 15 - 41 U/L   ALT 20 14 - 54 U/L   Alkaline Phosphatase 88 38 - 126 U/L   Total Bilirubin 0.6 0.3 - 1.2 mg/dL   GFR calc non Af Amer >60 >60 mL/min   GFR calc Af Amer >60 >60 mL/min   Anion gap 13 5  - 15   US Abdomen Complete  02/05/2015  CLINICAL DATA:  Abdominal pain for 9 days. EXAM: ULTRASOUND ABDOMEN COMPLETE COMPARISON:  CT abdomen and pelvis dated 01/27/2015. FINDINGS: Gallbladder: No gallstones or wall thickening visualized. No sonographic Murphy sign noted. Common bile duct: Diameter: Upper normal at 6 mm. No bile duct stone identified. No intrahepatic bile duct dilatation. Liver: Diffusely echogenic suggesting fatty infiltration. Associated mild hepatomegaly described on the earlier noncontrast CT. No focal mass or lesion identified within the liver. IVC: No abnormality visualized. Pancreas: Visualized portion unremarkable. No peripancreatic fluid seen or evidence of peripancreatic inflammation. Spleen: Size and appearance within normal limits. Right Kidney: Length: 11.7 cm. Echogenicity within normal limits. No mass or hydronephrosis visualized. Left Kidney: Length: 12.2 cm. Echogenicity within normal limits. No mass or hydronephrosis visualized. Abdominal aorta: No aneurysm visualized. Atherosclerotic changes noted by the sonographer. Other findings: None. IMPRESSION: 1. Fatty infiltration of the liver with associated mild hepatomegaly. No focal mass or lesion within the liver. 2. No acute findings. Electronically Signed   By: Franki Cabot M.D.   On: 02/05/2015 12:01   US Transvaginal Non-ob  02/05/2015  CLINICAL DATA:  Abdominal pain for 9 days. EXAM: ULTRASOUND PELVIS TRANSVAGINAL TECHNIQUE: Transvaginal ultrasound examination of the pelvis was performed including evaluation of the uterus, ovaries, adnexal regions, and pelvic cul-de-sac. COMPARISON:  01/27/2015 FINDINGS: Uterus Measurements: 6.6 x 3.9 x 4.1 cm . There is a large solid-appearing mass arising from the anterior uterine fundus measuring 15.8 x 14.2 x 8.3 cm Endometrium Thickness: Difficult to visualize due to presence of mass. No focal abnormality visualized. Right ovary Measurements: 3.1 x 1.8 x 2.6 cm. Normal appearance/no  adnexal mass. Left ovary Measurements: 4.3 x 4.0 x 2.7 cm. Cystic structure within the right ovary is favored to represent resolving corpus luteum. Other findings: Moderate free fluid is identified within the pelvis. IMPRESSION: 1. Large solid-appearing mass is identified arising from the anterior uterine fundus. This is favored to represent a subserosal leiomyoma. Leiomyosarcoma could not be excluded and if the patient remains symptomatic consider further evaluation contrast enhanced pelvic MRI. 2. Corpus luteal cyst in left ovary. 3. Moderate free fluid within the pelvis. Electronically Signed   By: Kerby Moors M.D.   On: 02/05/2015 12:36    MAU Course  Procedures  MDM Pt with recent visit to ED.  Labs re-ordered today along with U/S to further eval change from last.  Elevated white count Changes in u/s as above - mass substantially larger  Discussed pt presentation and results with Dr. Elly Modena.  She advises for pt to be discharge to home and followed outpatient by Dr. Ruthann Cancer as previously scheduled.  MD agreeable to percocet prescription for pain control in the meantime.    Assessment and Plan  A: Uterine mass, Pelvic Pain  P: Discharge to home  Percocet rx provided F/u with Dr. Ruthann Cancer on 11/29 as scheduled Continue good self care/appropriate hydration May use OTC ibuprofen Patient may return to MAU or other ED as needed or if her condition were to change or worsen    Paticia Stack 02/05/2015, 11:50 AM

## 2015-02-05 NOTE — MAU Note (Addendum)
Had diarrhea this morning, first episode. Was given percocet on d/c- only took it when it was really bad, would take a stool softener than also. Was nauseated this morning  Complaining of abd bloating

## 2015-02-05 NOTE — Discharge Instructions (Signed)

## 2015-02-16 ENCOUNTER — Other Ambulatory Visit: Payer: Self-pay

## 2015-02-16 DIAGNOSIS — Z1231 Encounter for screening mammogram for malignant neoplasm of breast: Secondary | ICD-10-CM

## 2015-02-17 ENCOUNTER — Other Ambulatory Visit (HOSPITAL_COMMUNITY): Payer: Self-pay | Admitting: Obstetrics

## 2015-02-17 DIAGNOSIS — R1900 Intra-abdominal and pelvic swelling, mass and lump, unspecified site: Secondary | ICD-10-CM

## 2015-02-17 DIAGNOSIS — R102 Pelvic and perineal pain: Secondary | ICD-10-CM

## 2015-02-26 ENCOUNTER — Ambulatory Visit (HOSPITAL_COMMUNITY)
Admission: RE | Admit: 2015-02-26 | Discharge: 2015-02-26 | Disposition: A | Discharge status: Home / Self Care | Payer: Medicaid Other | Source: Ambulatory Visit | Attending: Obstetrics | Admitting: Obstetrics

## 2015-02-26 DIAGNOSIS — R1900 Intra-abdominal and pelvic swelling, mass and lump, unspecified site: Secondary | ICD-10-CM

## 2015-02-26 DIAGNOSIS — R102 Pelvic and perineal pain: Secondary | ICD-10-CM | POA: Insufficient documentation

## 2015-02-26 MED ORDER — GADOBENATE DIMEGLUMINE 529 MG/ML IV SOLN
20.0000 mL | Freq: Once | INTRAVENOUS | Status: AC | PRN
  Administered 2015-02-26: 18 mL via INTRAVENOUS

## 2015-03-02 ENCOUNTER — Other Ambulatory Visit: Payer: Self-pay | Admitting: Obstetrics

## 2015-03-05 ENCOUNTER — Ambulatory Visit
Admission: RE | Admit: 2015-03-05 | Discharge: 2015-03-05 | Disposition: A | Discharge status: Home / Self Care | Payer: Medicaid Other | Source: Ambulatory Visit

## 2015-03-05 DIAGNOSIS — Z1231 Encounter for screening mammogram for malignant neoplasm of breast: Secondary | ICD-10-CM

## 2015-03-29 NOTE — Patient Instructions (Signed)
Your procedure is scheduled on:  Wednesday, Jan. 18, 2017  Enter through the [employer] of [facility] at:  7:00 A.M.  Pick up the phone at the desk and dial [phone].  Call this number if you have problems the morning of surgery: [phone].  Remember: Do NOT eat food or drink after:  Midnight Tuesday, Jan. 17, 2017 Take these medicines the morning of surgery with a SIP OF WATER:  None  Do NOT wear jewelry (body piercing), metal hair clips/bobby pins, make-up, or nail polish. Do NOT wear lotions, powders, or perfumes.  You may wear deoderant. Do NOT shave for 48 hours prior to surgery. Do NOT bring valuables to the hospital. Contacts, dentures, or bridgework may not be worn into surgery. Leave suitcase in car.  After surgery it may be brought to your room.  For patients admitted to the hospital, checkout time is 11:00 AM the day of discharge.

## 2015-03-30 ENCOUNTER — Inpatient Hospital Stay (HOSPITAL_COMMUNITY)
Admission: RE | Admit: 2015-03-30 | Discharge: 2015-03-30 | Disposition: A | Discharge status: Home / Self Care | Payer: Medicaid Other | Source: Ambulatory Visit

## 2015-04-02 ENCOUNTER — Encounter (HOSPITAL_COMMUNITY)
Admission: RE | Admit: 2015-04-02 | Discharge: 2015-04-02 | Disposition: A | Discharge status: Home / Self Care | Payer: Medicaid Other | Source: Ambulatory Visit | Attending: Obstetrics | Admitting: Obstetrics

## 2015-04-02 ENCOUNTER — Encounter (HOSPITAL_COMMUNITY): Payer: Self-pay

## 2015-04-02 DIAGNOSIS — Z01812 Encounter for preprocedural laboratory examination: Secondary | ICD-10-CM | POA: Insufficient documentation

## 2015-04-02 DIAGNOSIS — D259 Leiomyoma of uterus, unspecified: Secondary | ICD-10-CM | POA: Insufficient documentation

## 2015-04-02 LAB — CBC
HEMATOCRIT: 48.2 % — AB (ref 36.0–46.0)
HEMOGLOBIN: 16.3 g/dL — AB (ref 12.0–15.0)
MCH: 31.7 pg (ref 26.0–34.0)
MCHC: 33.8 g/dL (ref 30.0–36.0)
MCV: 93.6 fL (ref 78.0–100.0)
PLATELETS: 333 10*3/uL (ref 150–400)
RBC: 5.15 MIL/uL — AB (ref 3.87–5.11)
RDW: 15.3 % (ref 11.5–15.5)
WBC: 15.5 10*3/uL — ABNORMAL HIGH (ref 4.0–10.5)

## 2015-04-02 NOTE — Patient Instructions (Addendum)
Your procedure is scheduled on: March 06, 2016   Enter through the Main Entrance of [facility] at: 7:00 am   Pick up the phone at the desk and dial [phone].  Call this number if you have problems the morning of surgery: [phone].  Remember: Do NOT eat food: after midnight on Tuesday  Do NOT drink clear liquids after: midnight on Tuesday  Take these medicines the morning of surgery with a SIP OF WATER:  None   Do NOT wear jewelry (body piercing), metal hair clips/bobby pins, make-up, or nail polish. Do NOT wear lotions, powders, or perfumes.  You may wear deoderant. Do NOT shave for 48 hours prior to surgery. Do NOT bring valuables to the hospital. Contacts, dentures, or bridgework may not be worn into surgery. Leave suitcase in car.  After surgery it may be brought to your room.  For patients admitted to the hospital, checkout time is 11:00 AM the day of discharge.

## 2015-04-03 NOTE — H&P (Signed)
NAMEGAYLIN, HOSBACH NO.:  [id number]  MEDICAL RECORD NO.:  [id number]  LOCATION:  [place]                           FACILITY:  [facility]  PHYSICIAN:  Frederico Hamman, M.D.DATE OF BIRTH:  1970/11/19  DATE OF ADMISSION:  04/02/2015 DATE OF DISCHARGE:  04/02/2015                             HISTORY & PHYSICAL   HISTORY OF PRESENT ILLNESS:  The patient is a 45 year old, gravida 2, para 2-0-0-2, who was seen on February 16, 2015.  Her last period stopped 3 years ago and she was seen because she has not had a pelvic exam in 15 years and at that time, was found to have a large pelvic mass, which MRI confirmed was myoma, so she is in for a total abdominal hysterectomy possible bilateral salpingo-oophorectomy.  PAST MEDICAL HISTORY:  Negative.  PAST SURGICAL HISTORY:  She had dysplasia and she had a C-section.  Her Pap smear is negative.  SOCIAL HISTORY:  She smokes a half a pack a day of cigarettes.  She drinks occasionally.  No drugs.  SYSTEM REVIEW:  Noncontributory.  PHYSICAL EXAMINATION:  GENERAL:  Well-developed female, in no distress. HEENT:  Negative. LUNGS:  Clear to P and A. HEART:  Regular rhythm.  No murmurs, no gallops. ABDOMEN:  Mass arising from the pelvis, which is confirmed by MRI to be myoma. PELVIC:  Her Pap smear is negative.  Vagina external genitalia normal. EXTREMITIES:  Negative.          ______________________________ Frederico Hamman, M.D.     BAM/MEDQ  D:  04/02/2015  T:  04/03/2015  Job:  [id number]

## 2015-04-07 ENCOUNTER — Inpatient Hospital Stay (HOSPITAL_COMMUNITY)
Admission: RE | Admit: 2015-04-07 | Discharge: 2015-04-09 | DRG: 743 | Disposition: A | Discharge status: Home / Self Care | Payer: Medicaid Other | Source: Ambulatory Visit | Attending: Obstetrics | Admitting: Obstetrics

## 2015-04-07 ENCOUNTER — Encounter (HOSPITAL_COMMUNITY): Payer: Self-pay | Admitting: Anesthesiology

## 2015-04-07 ENCOUNTER — Inpatient Hospital Stay (HOSPITAL_COMMUNITY): Payer: Medicaid Other | Admitting: Anesthesiology

## 2015-04-07 ENCOUNTER — Encounter (HOSPITAL_COMMUNITY)
Admission: RE | Disposition: A | Discharge status: Home / Self Care | Payer: Self-pay | Source: Ambulatory Visit | Attending: Obstetrics

## 2015-04-07 DIAGNOSIS — F17210 Nicotine dependence, cigarettes, uncomplicated: Secondary | ICD-10-CM | POA: Diagnosis present

## 2015-04-07 DIAGNOSIS — D219 Benign neoplasm of connective and other soft tissue, unspecified: Secondary | ICD-10-CM

## 2015-04-07 DIAGNOSIS — D259 Leiomyoma of uterus, unspecified: Principal | ICD-10-CM | POA: Diagnosis present

## 2015-04-07 DIAGNOSIS — N83202 Unspecified ovarian cyst, left side: Secondary | ICD-10-CM | POA: Diagnosis not present

## 2015-04-07 DIAGNOSIS — N801 Endometriosis of ovary: Secondary | ICD-10-CM

## 2015-04-07 HISTORY — PX: BILATERAL SALPINGECTOMY: SHX5743

## 2015-04-07 HISTORY — DX: Benign neoplasm of connective and other soft tissue, unspecified: D21.9

## 2015-04-07 LAB — TYPE AND SCREEN
ABO/RH(D): O NEG
Antibody Screen: NEGATIVE

## 2015-04-07 LAB — ABO/RH: ABO/RH(D): O NEG

## 2015-04-07 SURGERY — SALPINGECTOMY, BILATERAL, OPEN
Anesthesia: General

## 2015-04-07 MED ORDER — NEOSTIGMINE METHYLSULFATE 10 MG/10ML IV SOLN
INTRAVENOUS | Status: AC
  Filled 2015-04-07: qty 1

## 2015-04-07 MED ORDER — DEXAMETHASONE SODIUM PHOSPHATE 10 MG/ML IJ SOLN
INTRAMUSCULAR | Status: DC | PRN
  Administered 2015-04-07: 4 mg via INTRAVENOUS

## 2015-04-07 MED ORDER — ROCURONIUM BROMIDE 100 MG/10ML IV SOLN
INTRAVENOUS | Status: AC
  Filled 2015-04-07: qty 1

## 2015-04-07 MED ORDER — MIDAZOLAM HCL 2 MG/2ML IJ SOLN
INTRAMUSCULAR | Status: AC
  Filled 2015-04-07: qty 2

## 2015-04-07 MED ORDER — SCOPOLAMINE 1 MG/3DAYS TD PT72
1.0000 | MEDICATED_PATCH | Freq: Once | TRANSDERMAL | Status: DC
  Administered 2015-04-07: 1.5 mg via TRANSDERMAL

## 2015-04-07 MED ORDER — PROMETHAZINE HCL 25 MG/ML IJ SOLN: 6.2500 mg | INTRAMUSCULAR | Status: DC | PRN

## 2015-04-07 MED ORDER — FENTANYL CITRATE (PF) 250 MCG/5ML IJ SOLN
INTRAMUSCULAR | Status: AC
  Filled 2015-04-07: qty 5

## 2015-04-07 MED ORDER — LIDOCAINE HCL (CARDIAC) 20 MG/ML IV SOLN
INTRAVENOUS | Status: AC
  Filled 2015-04-07: qty 5

## 2015-04-07 MED ORDER — ACETAMINOPHEN 10 MG/ML IV SOLN
1000.0000 mg | Freq: Four times a day (QID) | INTRAVENOUS | Status: DC
  Administered 2015-04-07: 1000 mg via INTRAVENOUS
  Filled 2015-04-07: qty 100

## 2015-04-07 MED ORDER — CEFAZOLIN SODIUM-DEXTROSE 2-3 GM-% IV SOLR
INTRAVENOUS | Status: AC
  Filled 2015-04-07: qty 50

## 2015-04-07 MED ORDER — MEPERIDINE HCL 25 MG/ML IJ SOLN
6.2500 mg | INTRAMUSCULAR | Status: DC | PRN
  Administered 2015-04-07: 12.5 mg via INTRAVENOUS

## 2015-04-07 MED ORDER — MEPERIDINE HCL 25 MG/ML IJ SOLN
INTRAMUSCULAR | Status: AC
  Filled 2015-04-07: qty 1

## 2015-04-07 MED ORDER — PHENYLEPHRINE 40 MCG/ML (10ML) SYRINGE FOR IV PUSH (FOR BLOOD PRESSURE SUPPORT)
PREFILLED_SYRINGE | INTRAVENOUS | Status: AC
  Filled 2015-04-07: qty 10

## 2015-04-07 MED ORDER — ROCURONIUM BROMIDE 100 MG/10ML IV SOLN
INTRAVENOUS | Status: DC | PRN
  Administered 2015-04-07: 40 mg via INTRAVENOUS

## 2015-04-07 MED ORDER — KETOROLAC TROMETHAMINE 10 MG PO TABS: 15.0000 mg | ORAL_TABLET | Freq: Once | ORAL | Status: DC

## 2015-04-07 MED ORDER — HYDROMORPHONE HCL 1 MG/ML IJ SOLN
INTRAMUSCULAR | Status: AC
  Filled 2015-04-07: qty 1

## 2015-04-07 MED ORDER — LIDOCAINE HCL (CARDIAC) 20 MG/ML IV SOLN
INTRAVENOUS | Status: DC | PRN
  Administered 2015-04-07: 70 mg via INTRAVENOUS
  Administered 2015-04-07: 30 mg via INTRAVENOUS

## 2015-04-07 MED ORDER — HYDROMORPHONE HCL 1 MG/ML IJ SOLN
1.0000 mg | Freq: Once | INTRAMUSCULAR | Status: AC
  Administered 2015-04-07: 1 mg via INTRAVENOUS

## 2015-04-07 MED ORDER — OXYCODONE-ACETAMINOPHEN 5-325 MG PO TABS
1.0000 | ORAL_TABLET | ORAL | Status: DC | PRN
  Administered 2015-04-07 – 2015-04-08 (×2): 1 via ORAL
  Administered 2015-04-08 (×2): 2 via ORAL
  Administered 2015-04-08: 1 via ORAL
  Administered 2015-04-08: 2 via ORAL
  Administered 2015-04-09: 1 via ORAL
  Administered 2015-04-09: 2 via ORAL
  Filled 2015-04-07: qty 1
  Filled 2015-04-07 (×3): qty 2
  Filled 2015-04-07: qty 1
  Filled 2015-04-07: qty 2
  Filled 2015-04-07: qty 1
  Filled 2015-04-07: qty 2

## 2015-04-07 MED ORDER — IBUPROFEN 800 MG PO TABS
800.0000 mg | ORAL_TABLET | Freq: Three times a day (TID) | ORAL | Status: DC | PRN
  Administered 2015-04-08 – 2015-04-09 (×3): 800 mg via ORAL
  Filled 2015-04-07 (×3): qty 1

## 2015-04-07 MED ORDER — PROPOFOL 10 MG/ML IV BOLUS
INTRAVENOUS | Status: AC
  Filled 2015-04-07: qty 20

## 2015-04-07 MED ORDER — NEOSTIGMINE METHYLSULFATE 10 MG/10ML IV SOLN
INTRAVENOUS | Status: DC | PRN
  Administered 2015-04-07: 4 mg via INTRAVENOUS

## 2015-04-07 MED ORDER — GLYCOPYRROLATE 0.2 MG/ML IJ SOLN
INTRAMUSCULAR | Status: DC | PRN
  Administered 2015-04-07: 0.6 mg via INTRAVENOUS

## 2015-04-07 MED ORDER — SODIUM CHLORIDE 0.9 % IJ SOLN
INTRAMUSCULAR | Status: AC
  Filled 2015-04-07: qty 10

## 2015-04-07 MED ORDER — FENTANYL CITRATE (PF) 100 MCG/2ML IJ SOLN
INTRAMUSCULAR | Status: DC | PRN
  Administered 2015-04-07: 100 ug via INTRAVENOUS
  Administered 2015-04-07 (×3): 50 ug via INTRAVENOUS

## 2015-04-07 MED ORDER — GLYCOPYRROLATE 0.2 MG/ML IJ SOLN
INTRAMUSCULAR | Status: AC
  Filled 2015-04-07: qty 3

## 2015-04-07 MED ORDER — HYDROMORPHONE HCL 1 MG/ML IJ SOLN
INTRAMUSCULAR | Status: AC
  Administered 2015-04-07: 0.5 mg via INTRAVENOUS
  Filled 2015-04-07: qty 1

## 2015-04-07 MED ORDER — DEXAMETHASONE SODIUM PHOSPHATE 4 MG/ML IJ SOLN
INTRAMUSCULAR | Status: AC
  Filled 2015-04-07: qty 1

## 2015-04-07 MED ORDER — GABAPENTIN 400 MG PO CAPS
400.0000 mg | ORAL_CAPSULE | Freq: Once | ORAL | Status: AC
  Administered 2015-04-07: 400 mg via ORAL
  Filled 2015-04-07: qty 1

## 2015-04-07 MED ORDER — PROPOFOL 10 MG/ML IV BOLUS
INTRAVENOUS | Status: DC | PRN
  Administered 2015-04-07: 200 mg via INTRAVENOUS

## 2015-04-07 MED ORDER — LACTATED RINGERS IV SOLN
INTRAVENOUS | Status: DC
  Administered 2015-04-07 (×2): via INTRAVENOUS

## 2015-04-07 MED ORDER — PHENYLEPHRINE HCL 10 MG/ML IJ SOLN
INTRAMUSCULAR | Status: DC | PRN
  Administered 2015-04-07: 40 ug via INTRAVENOUS
  Administered 2015-04-07 (×2): 80 ug via INTRAVENOUS

## 2015-04-07 MED ORDER — IPRATROPIUM-ALBUTEROL 20-100 MCG/ACT IN AERS
INHALATION_SPRAY | RESPIRATORY_TRACT | Status: DC | PRN
  Administered 2015-04-07 (×2): 1 via RESPIRATORY_TRACT

## 2015-04-07 MED ORDER — KETOROLAC TROMETHAMINE 30 MG/ML IJ SOLN: 30.0000 mg | Freq: Once | INTRAMUSCULAR | Status: DC

## 2015-04-07 MED ORDER — KETOROLAC TROMETHAMINE 30 MG/ML IJ SOLN
INTRAMUSCULAR | Status: DC | PRN
  Administered 2015-04-07: 30 mg via INTRAVENOUS

## 2015-04-07 MED ORDER — KETOROLAC TROMETHAMINE 30 MG/ML IJ SOLN
INTRAMUSCULAR | Status: AC
  Filled 2015-04-07: qty 1

## 2015-04-07 MED ORDER — HYDROMORPHONE HCL 1 MG/ML IJ SOLN
0.2000 mg | INTRAMUSCULAR | Status: DC | PRN
  Administered 2015-04-07 (×2): 0.6 mg via INTRAVENOUS
  Filled 2015-04-07 (×2): qty 1

## 2015-04-07 MED ORDER — HYDROMORPHONE HCL 1 MG/ML IJ SOLN
INTRAMUSCULAR | Status: AC
  Administered 2015-04-07: 1 mg via INTRAVENOUS
  Filled 2015-04-07: qty 1

## 2015-04-07 MED ORDER — KETOROLAC TROMETHAMINE 30 MG/ML IJ SOLN: 30.0000 mg | Freq: Four times a day (QID) | INTRAMUSCULAR | Status: DC

## 2015-04-07 MED ORDER — INFLUENZA VAC SPLIT QUAD 0.5 ML IM SUSY
0.5000 mL | PREFILLED_SYRINGE | INTRAMUSCULAR | Status: AC
  Administered 2015-04-08: 0.5 mL via INTRAMUSCULAR
  Filled 2015-04-07: qty 0.5

## 2015-04-07 MED ORDER — CEFAZOLIN SODIUM-DEXTROSE 2-3 GM-% IV SOLR
2.0000 g | INTRAVENOUS | Status: AC
  Administered 2015-04-07: 2 g via INTRAVENOUS

## 2015-04-07 MED ORDER — HEPARIN SODIUM (PORCINE) 5000 UNIT/ML IJ SOLN
INTRAMUSCULAR | Status: AC
  Filled 2015-04-07: qty 1

## 2015-04-07 MED ORDER — PNEUMOCOCCAL VAC POLYVALENT 25 MCG/0.5ML IJ INJ
0.5000 mL | INJECTION | INTRAMUSCULAR | Status: AC
  Administered 2015-04-08: 0.5 mL via INTRAMUSCULAR
  Filled 2015-04-07: qty 0.5

## 2015-04-07 MED ORDER — MIDAZOLAM HCL 2 MG/2ML IJ SOLN
INTRAMUSCULAR | Status: DC | PRN
  Administered 2015-04-07: 1 mg via INTRAVENOUS

## 2015-04-07 MED ORDER — ONDANSETRON HCL 4 MG/2ML IJ SOLN
INTRAMUSCULAR | Status: DC | PRN
  Administered 2015-04-07: 4 mg via INTRAVENOUS

## 2015-04-07 MED ORDER — HYDROMORPHONE HCL 1 MG/ML IJ SOLN
0.2500 mg | INTRAMUSCULAR | Status: DC | PRN
  Administered 2015-04-07 (×3): 0.5 mg via INTRAVENOUS

## 2015-04-07 MED ORDER — ONDANSETRON HCL 4 MG/2ML IJ SOLN
INTRAMUSCULAR | Status: AC
  Filled 2015-04-07: qty 2

## 2015-04-07 MED ORDER — ALBUTEROL SULFATE HFA 108 (90 BASE) MCG/ACT IN AERS
INHALATION_SPRAY | RESPIRATORY_TRACT | Status: AC
  Filled 2015-04-07: qty 6.7

## 2015-04-07 MED ORDER — SCOPOLAMINE 1 MG/3DAYS TD PT72
MEDICATED_PATCH | TRANSDERMAL | Status: AC
  Administered 2015-04-07: 1.5 mg via TRANSDERMAL
  Filled 2015-04-07: qty 1

## 2015-04-07 MED ORDER — KETOROLAC TROMETHAMINE 30 MG/ML IJ SOLN
30.0000 mg | Freq: Four times a day (QID) | INTRAMUSCULAR | Status: DC
  Administered 2015-04-07 – 2015-04-08 (×2): 30 mg via INTRAVENOUS
  Filled 2015-04-07 (×3): qty 1

## 2015-04-07 SURGICAL SUPPLY — 34 items
CANISTER SUCT 3000ML (MISCELLANEOUS) ×4 IMPLANT
CLOTH BEACON ORANGE TIMEOUT ST (SAFETY) ×4 IMPLANT
DECANTER SPIKE VIAL GLASS SM (MISCELLANEOUS) IMPLANT
DRAPE WARM FLUID 44X44 (DRAPE) IMPLANT
DRSG OPSITE POSTOP 4X10 (GAUZE/BANDAGES/DRESSINGS) ×4 IMPLANT
DURAPREP 26ML APPLICATOR (WOUND CARE) ×4 IMPLANT
GAUZE SPONGE 4X4 16PLY XRAY LF (GAUZE/BANDAGES/DRESSINGS) ×3 IMPLANT
GLOVE BIO SURGEON STRL SZ8.5 (GLOVE) ×7 IMPLANT
GLOVE BIOGEL PI IND STRL 7.0 (GLOVE) ×6 IMPLANT
GLOVE BIOGEL PI INDICATOR 7.0 (GLOVE) ×6
GOWN STRL REUS W/TWL LRG LVL3 (GOWN DISPOSABLE) ×8 IMPLANT
GOWN STRL REUS W/TWL XL LVL3 (GOWN DISPOSABLE) ×4 IMPLANT
NEEDLE HYPO 22GX1.5 SAFETY (NEEDLE) IMPLANT
NS IRRIG 1000ML POUR BTL (IV SOLUTION) ×4 IMPLANT
PACK ABDOMINAL GYN (CUSTOM PROCEDURE TRAY) ×4 IMPLANT
PAD OB MATERNITY 4.3X12.25 (PERSONAL CARE ITEMS) ×4 IMPLANT
PENCIL SMOKE EVAC W/HOLSTER (ELECTROSURGICAL) ×1 IMPLANT
SPONGE LAP 18X18 X RAY DECT (DISPOSABLE) ×5 IMPLANT
STAPLER VISISTAT 35W (STAPLE) ×3 IMPLANT
SUT CHROMIC 0 CT 1 (SUTURE) ×4 IMPLANT
SUT CHROMIC 1 CT1 27 (SUTURE) ×8 IMPLANT
SUT CHROMIC 1MO 4 18 CR8 (SUTURE) ×12 IMPLANT
SUT CHROMIC 2 0 CT 1 (SUTURE) ×4 IMPLANT
SUT CHROMIC GUT AB #0 18 (SUTURE) ×4 IMPLANT
SUT MON AB 4-0 PS1 27 (SUTURE) ×4 IMPLANT
SUT PDS AB 0 CT 36 (SUTURE) ×8 IMPLANT
SUT VIC AB 0 CT1 18XCR BRD8 (SUTURE) IMPLANT
SUT VIC AB 0 CT1 27 (SUTURE) ×4
SUT VIC AB 0 CT1 27XBRD ANBCTR (SUTURE) ×2 IMPLANT
SUT VIC AB 0 CT1 8-18 (SUTURE)
SYR CONTROL 10ML LL (SYRINGE) ×1 IMPLANT
TOWEL OR 17X24 6PK STRL BLUE (TOWEL DISPOSABLE) ×8 IMPLANT
TRAY FOLEY CATH SILVER 14FR (SET/KITS/TRAYS/PACK) ×4 IMPLANT
WATER STERILE IRR 1000ML POUR (IV SOLUTION) ×1 IMPLANT

## 2015-04-07 NOTE — H&P (Signed)
  There has been no change in her history and physical since the original dictation 

## 2015-04-07 NOTE — Op Note (Signed)
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 Gracy Racer First assistant Dr. Baltazar Najjar Anesthesia general Procedure patient placed on the operating table in the supine position after general anesthesia administered a transverse suprapubic incision made carried him to the rectus fascia fascia cleaned and incised length of the incision recti muscles retracted laterally peritoneum incised longitudinally was noted that there was a large pelvic mass underwhelming the abdomen and it had adhesions and this mass was peeled out intact and sent to pathology surface with small on exploring the pelvis it was noted the uterus was very small Left ovary and cyst in the right ovary was normal using Kelly clamps Claiborne Billings was placed across the left utero-ovarian ligament and the tube and ovary removed hemostasis achieved with #1 chromic on the right side the tube was grasped with a Kelly clamp cut suture ligated #1 chromic since the uterus was small and appeared normal and the right ovary was normal it was decided a hysterectomy would not be performed lap and sponge counts correct abdomen closed in layers peritoneum continuous double chromic fascia continuous with Demodex and subcutaneous tissue closed with 20 plain and skin closed with staples blood loss was on 100 cc patient tolerated the procedure well

## 2015-04-07 NOTE — Transfer of Care (Signed)
Immediate Anesthesia Transfer of Care Note  Patient: Michele [name]  Procedure(s) Performed: Procedure(s): BILATERAL SALPINGECTOMY with left oophorectomy, left ovarian cystectomy and removal of pelvic mass  Patient Location: PACU  Anesthesia Type:General  Level of Consciousness: awake, alert , oriented and patient cooperative  Airway & Oxygen Therapy: Patient Spontanous Breathing and Patient connected to nasal cannula oxygen  Post-op Assessment: Report given to RN and Post -op Vital signs reviewed and stable  Post vital signs: Reviewed and stable  Last Vitals:  Filed Vitals:   04/07/15 0739  BP: 115/87  Pulse: 100  Temp: 36.8 C  Resp: 18    Complications: No apparent anesthesia complications

## 2015-04-07 NOTE — Anesthesia Procedure Notes (Signed)
Procedure Name: Intubation Performed by: Tobin Chad Pre-anesthesia Checklist: Patient identified, Patient being monitored, Emergency Drugs available, Timeout performed and Suction available Patient Re-evaluated:Patient Re-evaluated prior to inductionOxygen Delivery Method: Circle system utilized and Simple face mask Preoxygenation: Pre-oxygenation with 100% oxygen Intubation Type: IV induction and Inhalational induction Ventilation: Mask ventilation without difficulty Laryngoscope Size: Mac and 3 Grade View: Grade II Tube type: Oral Tube size: 7.0 mm Number of attempts: 1 Airway Equipment and Method: Stylet Placement Confirmation: ETT inserted through vocal cords under direct vision,  breath sounds checked- equal and bilateral and positive ETCO2 Secured at: 22 cm Tube secured with: Tape Dental Injury: Teeth and Oropharynx as per pre-operative assessment

## 2015-04-07 NOTE — Anesthesia Postprocedure Evaluation (Signed)
Anesthesia Post Note  Patient: Michele [name]  Procedure(s) Performed: Procedure(s): BILATERAL SALPINGECTOMY with left oophorectomy, left ovarian cystectomy and removal of pelvic mass  Patient location during evaluation: PACU Anesthesia Type: General Level of consciousness: sedated Pain management: pain level controlled Vital Signs Assessment: post-procedure vital signs reviewed and stable Cardiovascular status: stable Postop Assessment: no signs of nausea or vomiting Anesthetic complications: no Comments: Pt will receive 1g of Tylenol IV to better control her pain which has been treated with 3mg  of Dilaudid, 45mg  of Toradol, 400mg  of Neurontin. She refuses epidural and Tramadol.    Last Vitals:  Filed Vitals:   04/07/15 1030 04/07/15 1045  BP: 130/73 115/83  Pulse: 94 100  Temp:    Resp: 13 16    Last Pain: There were no vitals filed for this visit.               [place]

## 2015-04-07 NOTE — Anesthesia Preprocedure Evaluation (Signed)
Anesthesia Evaluation  Patient identified by MRN, date of birth, ID band Patient awake    Reviewed: Allergy & Precautions, H&P , NPO status , Patient's Chart, lab work & pertinent test results  Airway Mallampati: II  TM Distance: >3 FB Neck ROM: full    Dental no notable dental hx. (+) Teeth Intact   Pulmonary Current Smoker,    Pulmonary exam normal        Cardiovascular negative cardio ROS Normal cardiovascular exam     Neuro/Psych negative neurological ROS  negative psych ROS   GI/Hepatic negative GI ROS, Neg liver ROS,   Endo/Other  negative endocrine ROS  Renal/GU negative Renal ROS     Musculoskeletal   Abdominal (+) + obese,   Peds  Hematology negative hematology ROS (+)   Anesthesia Other Findings   Reproductive/Obstetrics negative OB ROS                             Anesthesia Physical Anesthesia Plan  ASA: II  Anesthesia Plan: General   Post-op Pain Management:    Induction: Intravenous  Airway Management Planned: Oral ETT  Additional Equipment:   Intra-op Plan:   Post-operative Plan: Extubation in OR  Informed Consent: I have reviewed the patients History and Physical, chart, labs and discussed the procedure including the risks, benefits and alternatives for the proposed anesthesia with the patient or authorized representative who has indicated his/her understanding and acceptance.   Dental Advisory Given  Plan Discussed with: CRNA and Surgeon  Anesthesia Plan Comments:         Anesthesia Quick Evaluation

## 2015-04-08 ENCOUNTER — Encounter (HOSPITAL_COMMUNITY): Payer: Self-pay | Admitting: Obstetrics

## 2015-04-08 NOTE — Progress Notes (Signed)
Patient ID: Michele [name], female   DOB: October 22, 1970, 45 y.o.   MRN: [id number] Post op day one Blood pressure 112/70 afebrile pulse 77 respiration 16 Abdomen soft Legs negative patient ambulating well no complaints

## 2015-04-08 NOTE — Anesthesia Postprocedure Evaluation (Signed)
Anesthesia Post Note  Patient: Michele [name]  Procedure(s) Performed: Procedure(s): BILATERAL SALPINGECTOMY with left oophorectomy, left ovarian cystectomy and removal of pelvic mass  Patient location during evaluation: [facility] Anesthesia Type: General Level of consciousness: awake Pain management: satisfactory to patient Vital Signs Assessment: post-procedure vital signs reviewed and stable Respiratory status: spontaneous breathing Cardiovascular status: stable Anesthetic complications: no    Last Vitals:  Filed Vitals:   04/08/15 0141 04/08/15 0613  BP: 118/72 112/70  Pulse: 73 77  Temp: 36.8 C 36.4 C  Resp: 16 16    Last Pain:  Filed Vitals:   04/08/15 0915  PainSc: 3                  Desarae Placide

## 2015-04-08 NOTE — Addendum Note (Signed)
Addendum  created 04/08/15 1005 by Asher Muir, CRNA   Modules edited: Clinical Notes   Clinical Notes:  File: [id number]

## 2015-04-09 NOTE — Discharge Instructions (Signed)
Hysterectomy, Abdominal  °Care After °Please read the instructions below. Refer to these instructions for the next few weeks. These instructions provide you with general information on caring for yourself after surgery. Your caregiver may also give you specific instructions. While your treatment has been planned according to the most current medical practices available, unavoidable problems sometimes happen. If you have any problems or questions after you leave, please call your caregiver. °HOME CARE INSTRUCTIONS  °Healing will take time. You will have discomfort, tenderness, swelling and bruising at the operative site for a couple of weeks. This is normal and will get better as time goes on.  °· Only take over-the-counter or prescription medicines for pain, discomfort or fever as directed by your caregiver.  °· Do not take aspirin. It can cause bleeding.  °· Do not drive when taking pain medication.  °· Follow your caregiver's advice regarding diet, exercise, lifting, driving and general activities.  °· Resume your usual diet as directed and allowed.  °· Get plenty of rest and sleep.  °· Do not douche, use tampons, or have sexual intercourse until your caregiver gives you permission.  °· Change your bandages (dressings) as directed.  °· Take your temperature twice a day. Write it down.  °· Your caregiver may recommend showers instead of baths for a few weeks.  °· Do not drink alcohol until your caregiver gives you permission.  °· If you develop constipation, you may take a mild laxative with your caregiver's permission. Bran foods and drinking fluids helps with constipation problems.  °· Try to have someone home with you for a week or two to help with the household activities.  °· Make sure you and your family understands everything about your operation and recovery.  °· Do not sign any legal documents until you feel normal again.  °· Keep all your follow-up appointments as recommended by your caregiver.  °SEEK  MEDICAL CARE IF:  °· There is swelling, redness or increasing pain in the wound area.  °· Pus is coming from the wound.  °· You notice a bad smell from the wound or surgical dressing.  °· You have pain, redness and swelling from the intravenous site.  °· The wound is breaking open (the edges are not staying together).  °· You feel dizzy or feel like fainting.  °· You develop pain or bleeding when you urinate.  °· You develop diarrhea.  °· You develop nausea and vomiting.  °· You develop abnormal vaginal discharge.  °· You develop a rash.  °· You have any type of abnormal reaction or develop an allergy to your medication.  °· You need stronger pain medication for your pain.  °SEEK IMMEDIATE MEDICAL CARE IF: °· You have a fever.  °· You develop abdominal pain.  °· You develop chest pain.  °· You develop shortness of breath.  °· You pass out.  °· You develop pain, swelling or redness of your leg.  °· You develop heavy vaginal bleeding with or without blood clots.  °Document Released: 09/23/2004 Document Revised: 09/09/2010 Document Reviewed: 12/06/2008 °[employer]® Patient Information ©2012 [employer]. °

## 2015-04-09 NOTE — Progress Notes (Signed)
Patient ID: Michele [name], female   DOB: [birth year], 45 y.o.   MRN: [id number] Postop day 2 Vital signs normal Abdomen soft Dressing dry patient wants early discharge home today to see me on Monday to remove her staples

## 2015-04-09 NOTE — Progress Notes (Signed)
Pt is discharged in the care of Son, with R.N. Escort. Denies any pain or discomfort. No equipment needed for home use. Abdominal dressing is clean and dry. Stable . Spirits are good. Understands all discharged instructions well.

## 2015-04-09 NOTE — Progress Notes (Signed)
Patient ID: Michele [name], female   DOB: [birth year], 45 y.o.   MRN: [id number] Postop day 2 Blood pressure 140 394 pulse 80 respiration 18 Abdomen soft Dressing dry Legs negative doing well

## 2015-04-09 NOTE — Discharge Summary (Signed)
  Patient's a 45 year old female who was noted to have a large abdominal mass ultrasound suggested myomas she had an exploratory laparotomy and she had a 14 x 8 cm what appeared to be a degenerating pedunculated myoma which was removed she also had a bilateral salpingectomy and left oophorectomy her right ovary was normal the uterus and ovaries were not removed the uterus was small awaiting pathology

## 2015-11-27 ENCOUNTER — Encounter: Payer: Self-pay | Admitting: *Deleted

## 2015-11-27 LAB — LAB REPORT - SCANNED: Pap: NEGATIVE

## 2017-02-05 IMAGING — US US ABDOMEN COMPLETE
1 series · 15 of 25 positions shown · non-contrast
Comparison: CT abdomen and pelvis dated 01/27/2015.

CLINICAL DATA: Abdominal pain for 9 days.

EXAM:
ULTRASOUND ABDOMEN COMPLETE

[Series 1: us abdomen complete · 15 of 59 slices shown]
[im 1/59]
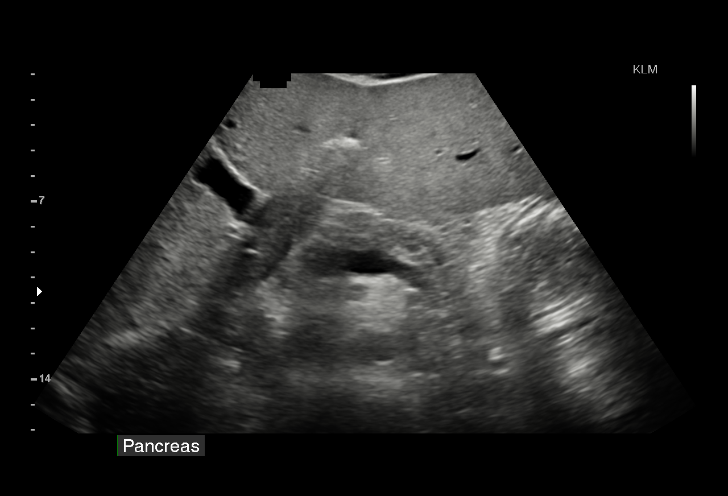
[im 5/59]
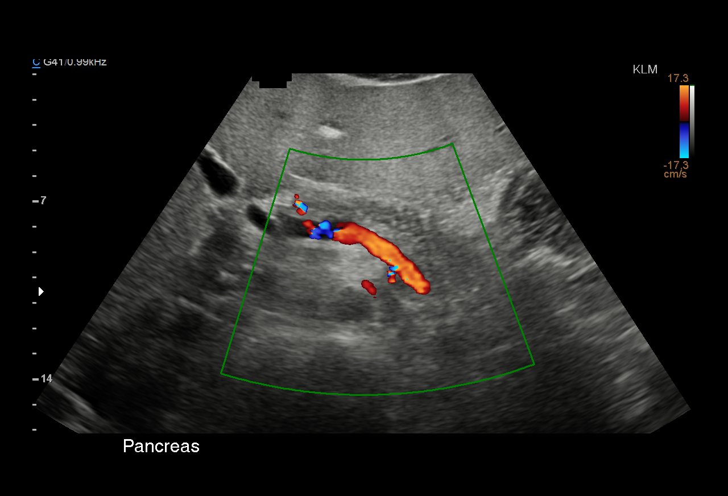
[im 10/59]
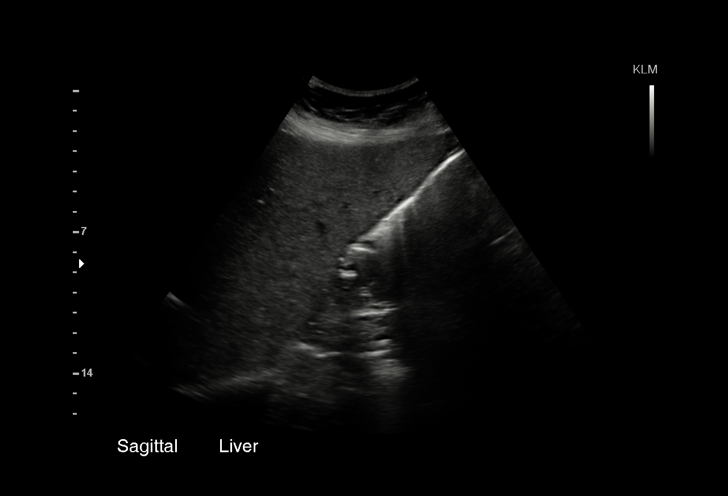
[im 13/59]
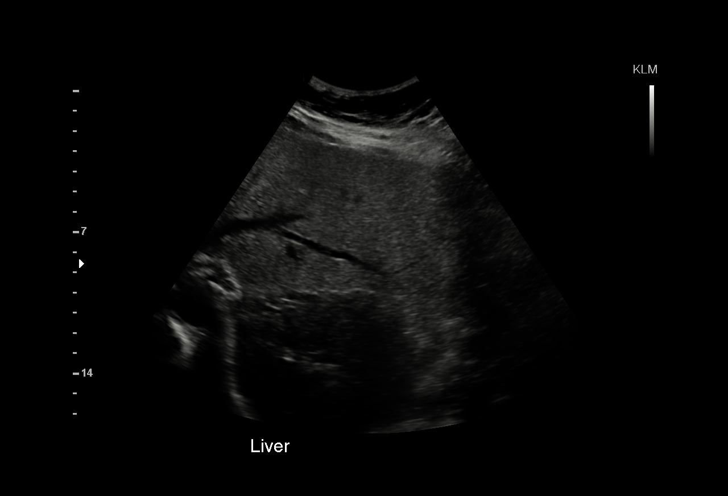
[im 17/59]
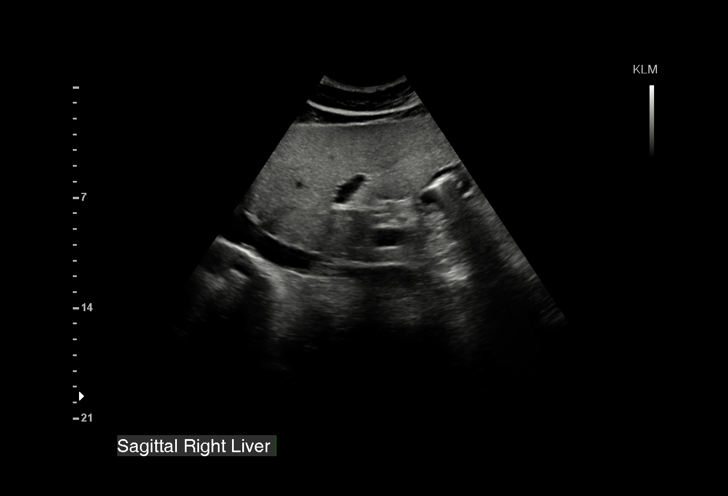
[im 22/59]
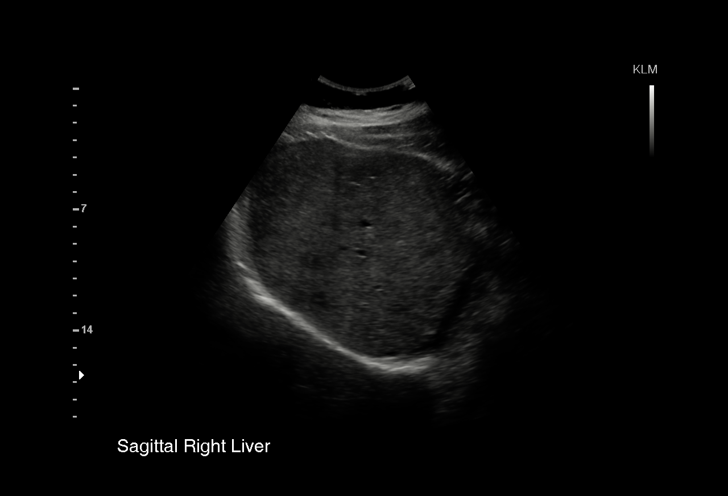
[im 25/59]
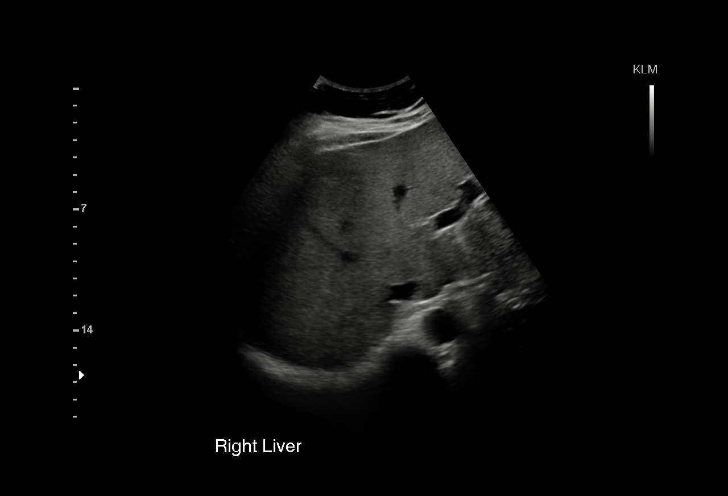
[im 30/59]
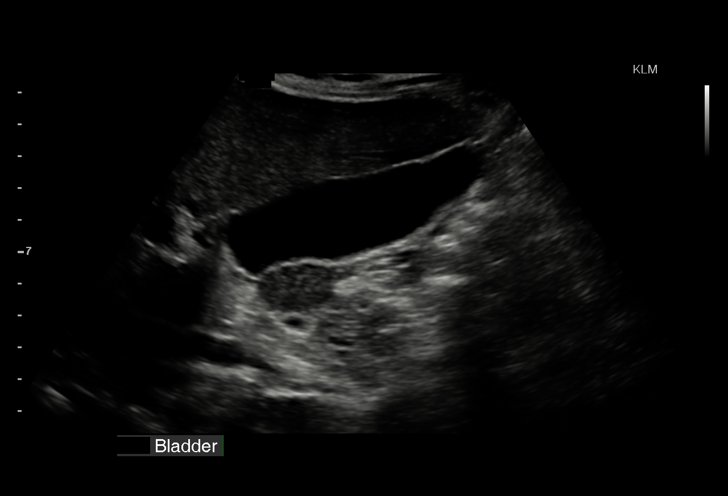
[im 34/59]
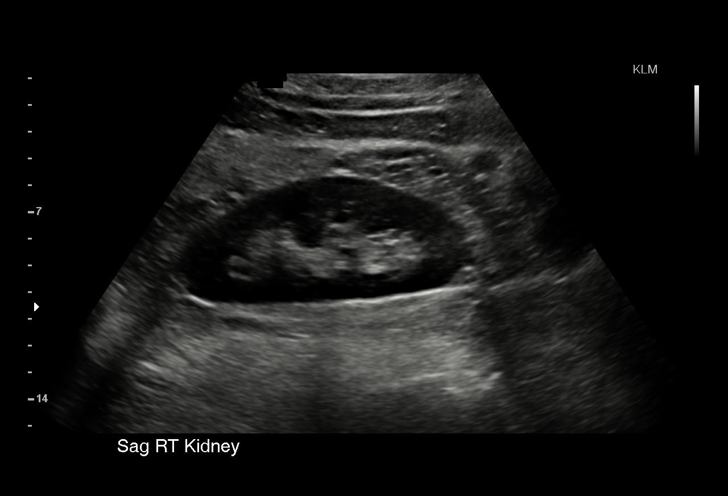
[im 37/59]
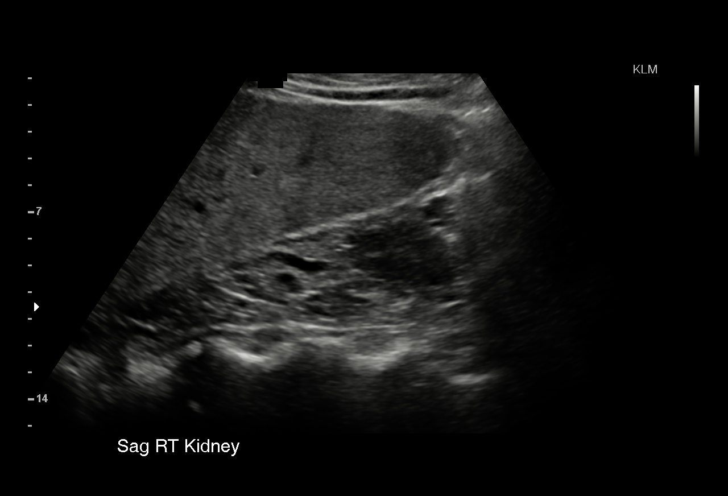
[im 42/59]
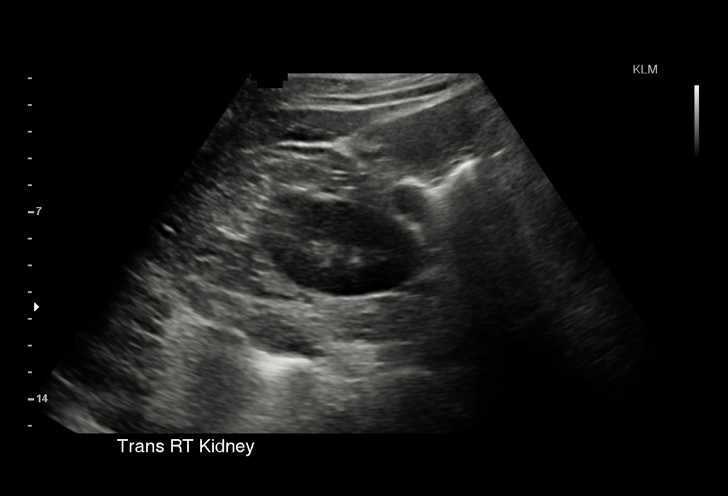
[im 46/59]
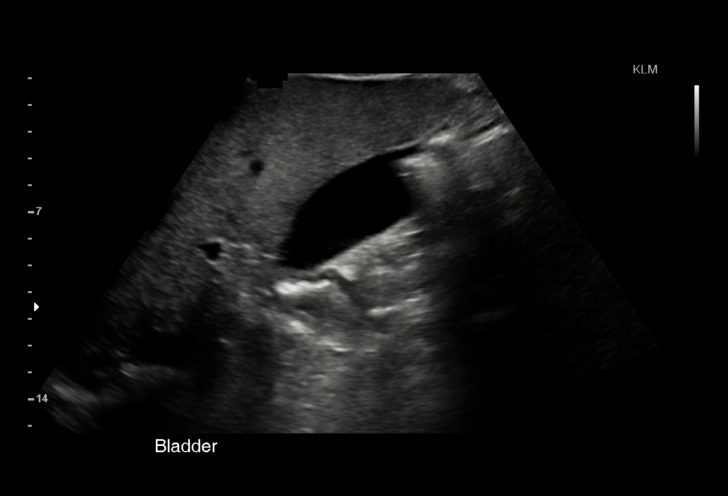
[im 49/59]
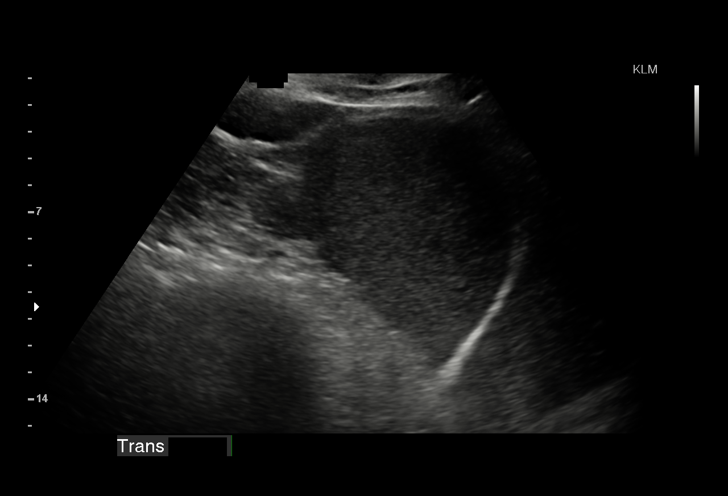
[im 54/59]
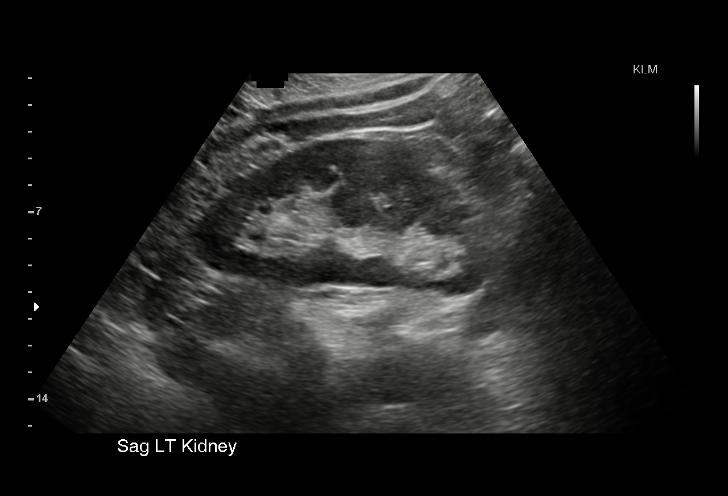
[im 59/59]
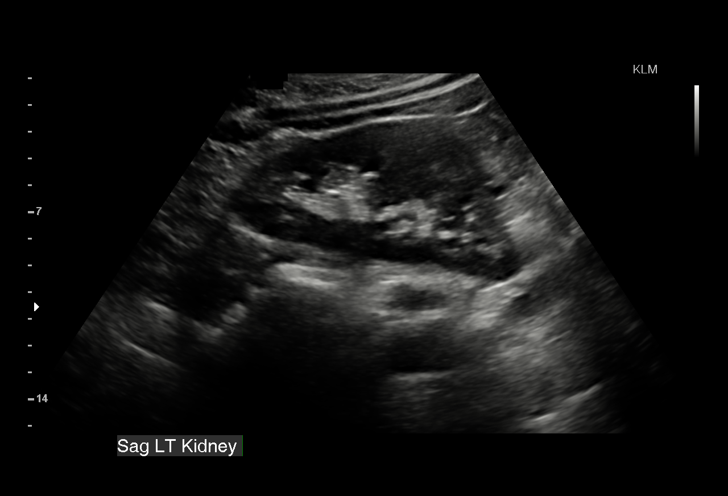

[15 of 25 positions shown; findings below may reference images not displayed]

FINDINGS: Gallbladder: No gallstones or wall thickening visualized. No
sonographic Murphy sign noted.

Common bile duct: Diameter: Upper normal at 6 mm. No bile duct stone
identified. No intrahepatic bile duct dilatation.

Liver: Diffusely echogenic suggesting fatty infiltration. Associated
mild hepatomegaly described on the earlier noncontrast CT. No focal
mass or lesion identified within the liver.

IVC: No abnormality visualized.

Pancreas: Visualized portion unremarkable. No peripancreatic fluid
seen or evidence of peripancreatic inflammation.

Spleen: Size and appearance within normal limits.

Right Kidney: Length: 11.7 cm. Echogenicity within normal limits. No
mass or hydronephrosis visualized.

Left Kidney: Length: 12.2 cm. Echogenicity within normal limits. No
mass or hydronephrosis visualized.

Abdominal aorta: No aneurysm visualized. Atherosclerotic changes
noted by the sonographer.

Other findings: None.
IMPRESSION: 1. Fatty infiltration of the liver with associated mild
hepatomegaly. No focal mass or lesion within the liver.
2. No acute findings.

## 2017-02-05 IMAGING — US US TRANSVAGINAL NON-OB
2 series · 15 of 25 positions shown · non-contrast
Comparison: 01/27/2015

CLINICAL DATA: Abdominal pain for 9 days.

EXAM:
ULTRASOUND PELVIS TRANSVAGINAL
TECHNIQUE: Transvaginal ultrasound examination of the pelvis was performed
including evaluation of the uterus, ovaries, adnexal regions, and
pelvic cul-de-sac.

[Series 1: us transvaginal non-ob · 13 of 34 slices shown (1 of 2)]
[im 1/34]
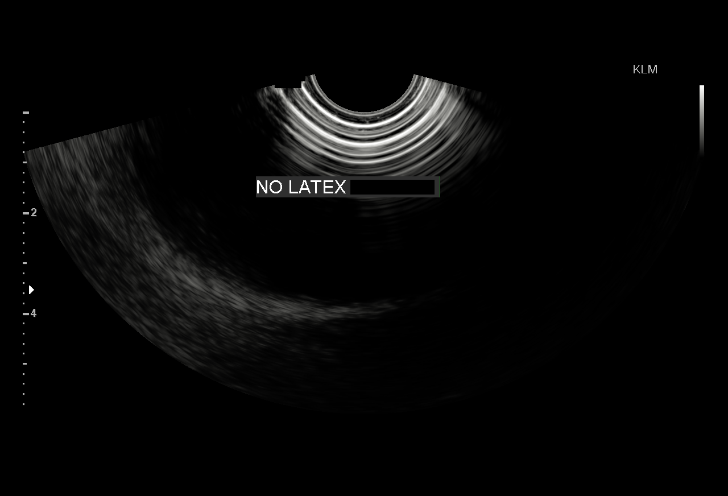
[im 4/34]
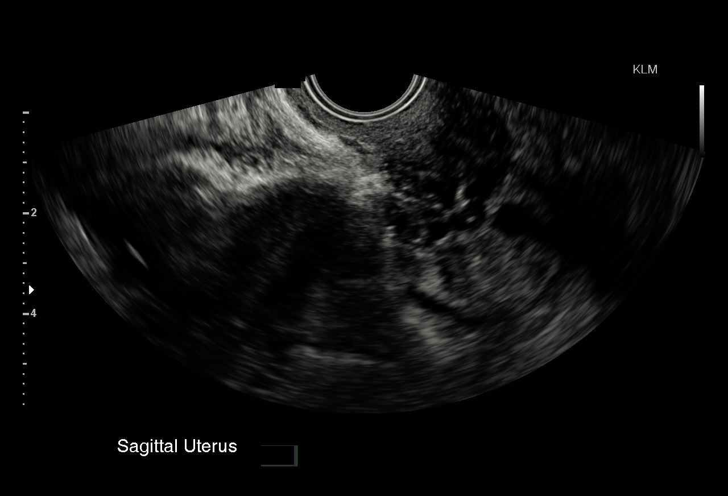
[im 7/34]
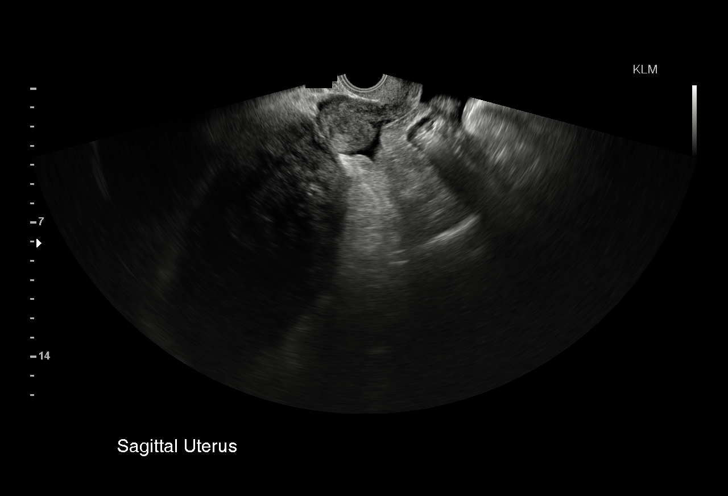
[im 9/34]
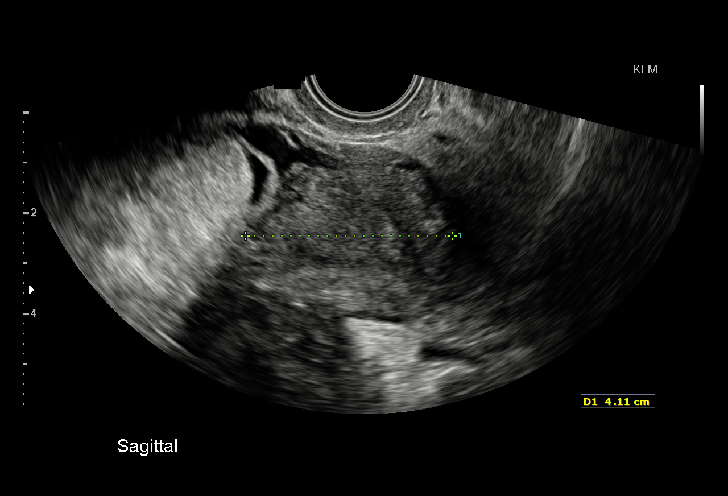
[im 12/34]
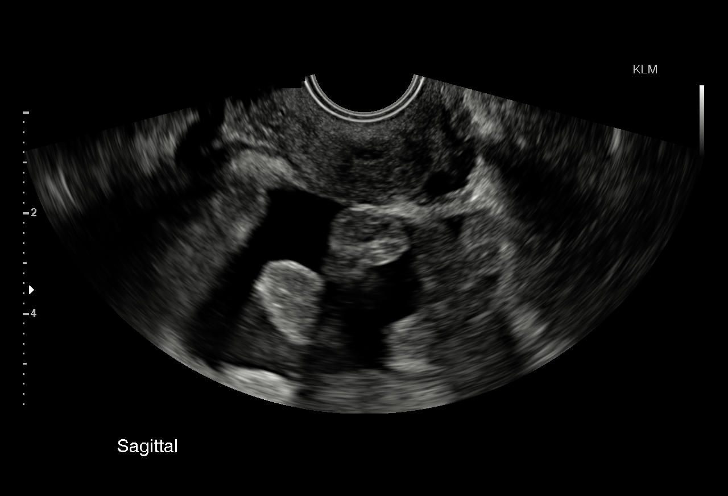
[im 15/34]
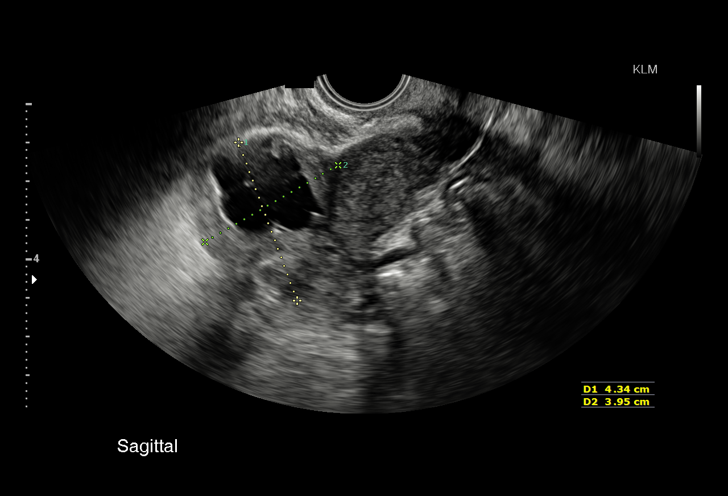
[im 17/34]
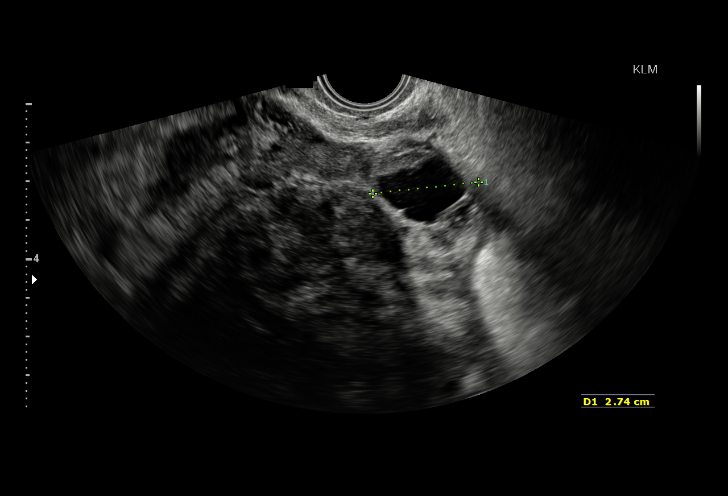
[im 20/34]
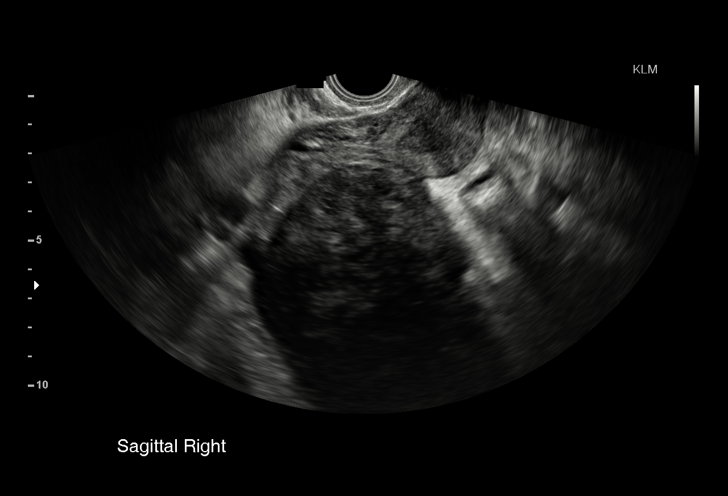
[im 24/34]
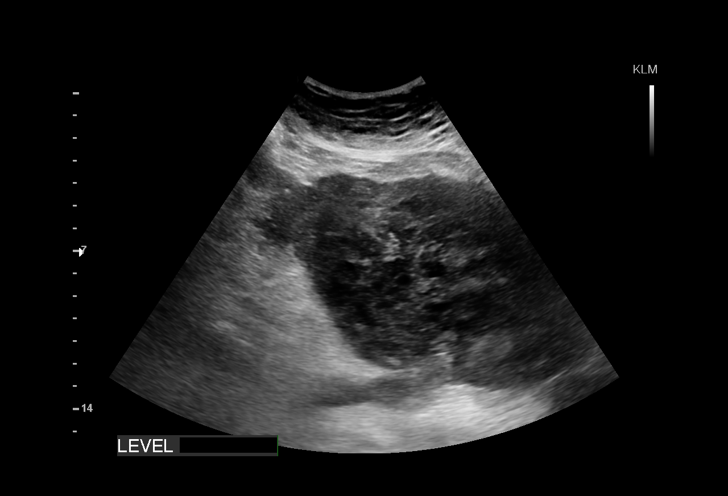
[im 25/34]
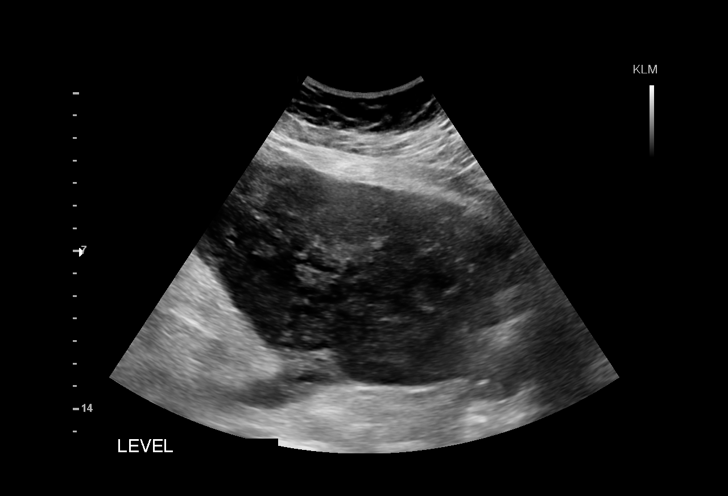
[im 29/34]
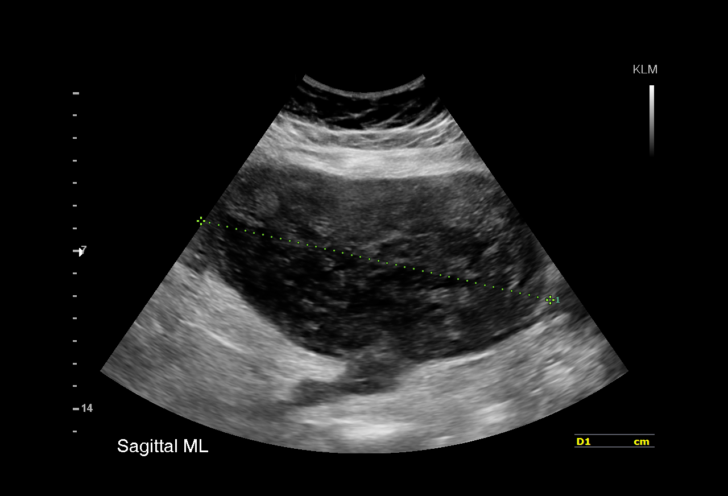
[im 32/34]
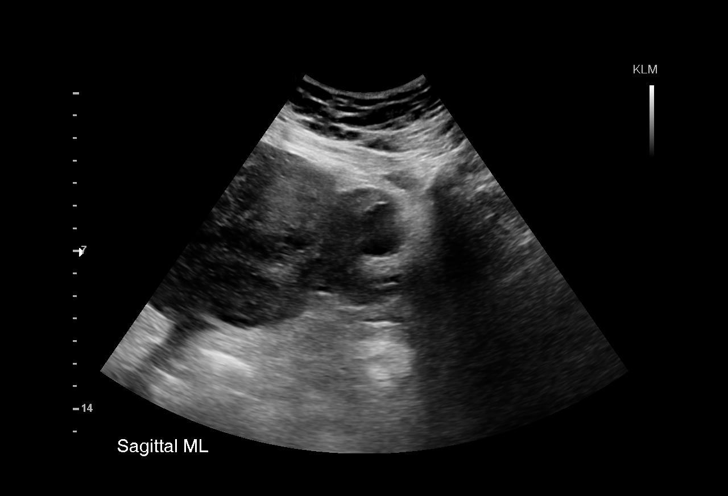
[im 34/34]
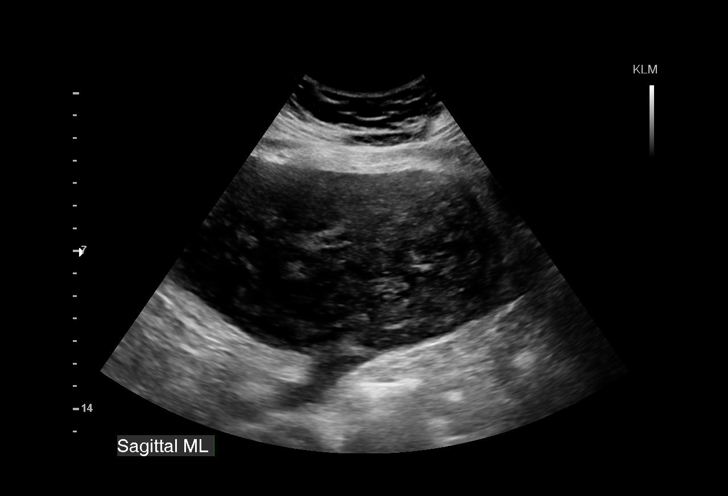

[Series 2: us transvaginal non-ob · 2 of 6 slices shown (2 of 2)]
[im 2/6]
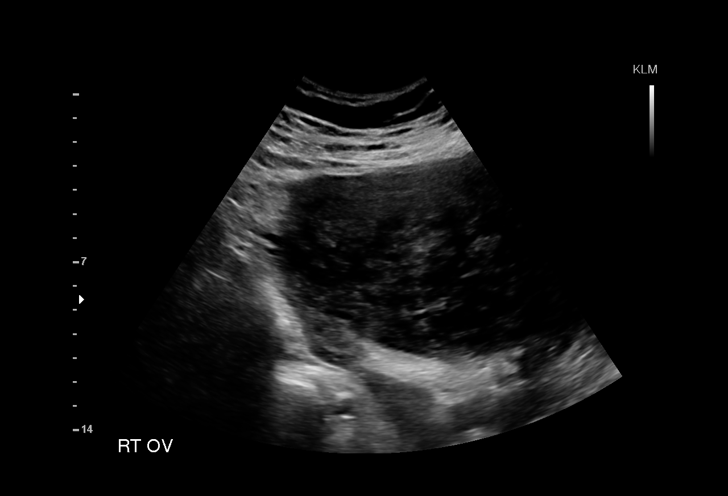
[im 6/6]
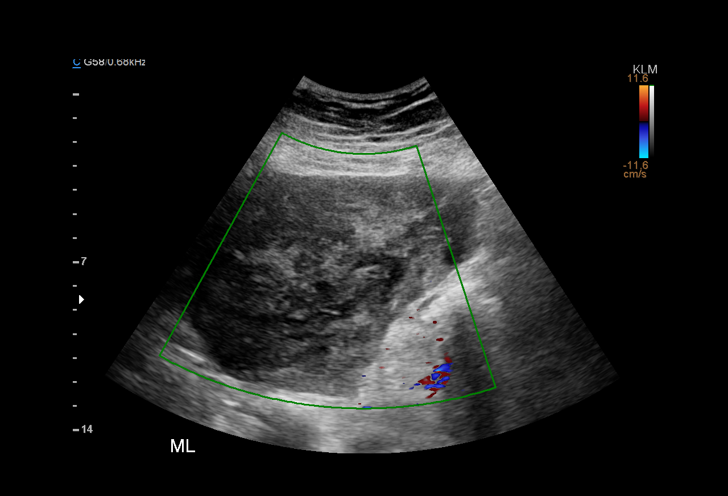

[15 of 25 positions shown; findings below may reference images not displayed]

FINDINGS: Uterus

Measurements: 6.6 x 3.9 x 4.1 cm . There is a large solid-appearing
mass arising from the anterior uterine fundus measuring 15.8 x
x 8.3 cm

Endometrium

Thickness: Difficult to visualize due to presence of mass.. No focal
abnormality visualized.

Right ovary

Measurements: 3.1 x 1.8 x 2.6 cm.. Normal appearance/no adnexal
mass.

Left ovary

Measurements: 4.3 x 4.0 x 2.7 cm. Cystic structure within the right
ovary is favored to represent resolving corpus luteum.

Other findings: Moderate free fluid is identified within the pelvis.
IMPRESSION: 1. Large solid-appearing mass is identified arising from the
anterior uterine fundus. This is favored to represent a subserosal
leiomyoma. Leiomyosarcoma could not be excluded and if the patient
remains symptomatic consider further evaluation contrast enhanced
pelvic MRI.
2. Corpus luteal cyst in left ovary.
3. Moderate free fluid within the pelvis.

## 2021-01-21 ENCOUNTER — Other Ambulatory Visit: Payer: Self-pay | Admitting: Obstetrics and Gynecology

## 2021-01-21 DIAGNOSIS — Z1231 Encounter for screening mammogram for malignant neoplasm of breast: Secondary | ICD-10-CM

## 2021-03-03 ENCOUNTER — Emergency Department (HOSPITAL_COMMUNITY): Payer: 59

## 2021-03-03 ENCOUNTER — Other Ambulatory Visit: Payer: Self-pay

## 2021-03-03 ENCOUNTER — Inpatient Hospital Stay (HOSPITAL_COMMUNITY)
Admission: EM | Admit: 2021-03-03 | Discharge: 2021-03-09 | DRG: 177 | Disposition: A | Discharge status: Home / Self Care | Payer: 59 | Attending: Family Medicine | Admitting: Family Medicine

## 2021-03-03 DIAGNOSIS — Z8249 Family history of ischemic heart disease and other diseases of the circulatory system: Secondary | ICD-10-CM

## 2021-03-03 DIAGNOSIS — Z90721 Acquired absence of ovaries, unilateral: Secondary | ICD-10-CM

## 2021-03-03 DIAGNOSIS — Z803 Family history of malignant neoplasm of breast: Secondary | ICD-10-CM

## 2021-03-03 DIAGNOSIS — J9600 Acute respiratory failure, unspecified whether with hypoxia or hypercapnia: Secondary | ICD-10-CM | POA: Diagnosis not present

## 2021-03-03 DIAGNOSIS — U071 COVID-19: Principal | ICD-10-CM | POA: Diagnosis present

## 2021-03-03 DIAGNOSIS — Z720 Tobacco use: Secondary | ICD-10-CM | POA: Diagnosis not present

## 2021-03-03 DIAGNOSIS — F17210 Nicotine dependence, cigarettes, uncomplicated: Secondary | ICD-10-CM | POA: Diagnosis present

## 2021-03-03 DIAGNOSIS — R0602 Shortness of breath: Secondary | ICD-10-CM

## 2021-03-03 DIAGNOSIS — I700 Atherosclerosis of aorta: Secondary | ICD-10-CM | POA: Diagnosis present

## 2021-03-03 DIAGNOSIS — L304 Erythema intertrigo: Secondary | ICD-10-CM | POA: Diagnosis not present

## 2021-03-03 DIAGNOSIS — Z6839 Body mass index (BMI) 39.0-39.9, adult: Secondary | ICD-10-CM

## 2021-03-03 DIAGNOSIS — E871 Hypo-osmolality and hyponatremia: Secondary | ICD-10-CM | POA: Diagnosis not present

## 2021-03-03 DIAGNOSIS — J1282 Pneumonia due to coronavirus disease 2019: Secondary | ICD-10-CM | POA: Diagnosis present

## 2021-03-03 DIAGNOSIS — Z716 Tobacco abuse counseling: Secondary | ICD-10-CM

## 2021-03-03 DIAGNOSIS — F419 Anxiety disorder, unspecified: Secondary | ICD-10-CM

## 2021-03-03 DIAGNOSIS — Z8049 Family history of malignant neoplasm of other genital organs: Secondary | ICD-10-CM

## 2021-03-03 DIAGNOSIS — Z808 Family history of malignant neoplasm of other organs or systems: Secondary | ICD-10-CM

## 2021-03-03 DIAGNOSIS — G4733 Obstructive sleep apnea (adult) (pediatric): Secondary | ICD-10-CM

## 2021-03-03 DIAGNOSIS — D751 Secondary polycythemia: Secondary | ICD-10-CM | POA: Diagnosis present

## 2021-03-03 DIAGNOSIS — J9601 Acute respiratory failure with hypoxia: Secondary | ICD-10-CM | POA: Diagnosis present

## 2021-03-03 DIAGNOSIS — J439 Emphysema, unspecified: Secondary | ICD-10-CM | POA: Diagnosis present

## 2021-03-03 DIAGNOSIS — J9602 Acute respiratory failure with hypercapnia: Secondary | ICD-10-CM | POA: Diagnosis present

## 2021-03-03 DIAGNOSIS — E119 Type 2 diabetes mellitus without complications: Secondary | ICD-10-CM

## 2021-03-03 DIAGNOSIS — E662 Morbid (severe) obesity with alveolar hypoventilation: Secondary | ICD-10-CM | POA: Diagnosis present

## 2021-03-03 LAB — COMPREHENSIVE METABOLIC PANEL
ALT: 34 U/L (ref 0–44)
AST: 30 U/L (ref 15–41)
Albumin: 3.4 g/dL — ABNORMAL LOW (ref 3.5–5.0)
Alkaline Phosphatase: 69 U/L (ref 38–126)
Anion gap: 6 (ref 5–15)
BUN: <5 mg/dL — ABNORMAL LOW (ref 6–20)
CO2: 37 mmol/L — ABNORMAL HIGH (ref 22–32)
Calcium: 8.2 mg/dL — ABNORMAL LOW (ref 8.9–10.3)
Chloride: 95 mmol/L — ABNORMAL LOW (ref 98–111)
Creatinine, Ser: 0.75 mg/dL (ref 0.44–1.00)
GFR, Estimated: >60 mL/min (ref >60)
Glucose, Bld: 116 mg/dL — ABNORMAL HIGH (ref 70–99)
Potassium: 3.9 mmol/L (ref 3.5–5.1)
Sodium: 138 mmol/L (ref 135–145)
Total Bilirubin: 0.4 mg/dL (ref 0.3–1.2)
Total Protein: 6.5 g/dL (ref 6.5–8.1)

## 2021-03-03 LAB — BLOOD GAS, VENOUS
Acid-Base Excess: 10.5 mmol/L — ABNORMAL HIGH (ref 0.0–2.0)
Bicarbonate: 37.3 mmol/L — ABNORMAL HIGH (ref 20.0–28.0)
Drawn by: 5979
O2 Saturation: 91.1 %
Patient temperature: 37
pCO2, Ven: 82.5 mmHg (ref 44.0–60.0)
pH, Ven: 7.277 (ref 7.250–7.430)
pO2, Ven: 64.7 mmHg — ABNORMAL HIGH (ref 32.0–45.0)

## 2021-03-03 LAB — I-STAT VENOUS BLOOD GAS, ED
Acid-Base Excess: 8 mmol/L — ABNORMAL HIGH (ref 0.0–2.0)
Bicarbonate: 39.8 mmol/L — ABNORMAL HIGH (ref 20.0–28.0)
Calcium, Ion: 1.02 mmol/L — ABNORMAL LOW (ref 1.15–1.40)
HCT: 59 % — ABNORMAL HIGH (ref 36.0–46.0)
Hemoglobin: 20.1 g/dL — ABNORMAL HIGH (ref 12.0–15.0)
O2 Saturation: 93 %
Potassium: 4.2 mmol/L (ref 3.5–5.1)
Sodium: 137 mmol/L (ref 135–145)
TCO2: 42 mmol/L — ABNORMAL HIGH (ref 22–32)
pCO2, Ven: 80.4 mmHg (ref 44.0–60.0)
pH, Ven: 7.303 (ref 7.250–7.430)
pO2, Ven: 79 mmHg — ABNORMAL HIGH (ref 32.0–45.0)

## 2021-03-03 LAB — HEMOGLOBIN A1C
Hgb A1c MFr Bld: 6.5 % — ABNORMAL HIGH (ref 4.8–5.6)
Mean Plasma Glucose: 139.85 mg/dL

## 2021-03-03 LAB — CBC WITH DIFFERENTIAL/PLATELET
Abs Immature Granulocytes: 0.03 10*3/uL (ref 0.00–0.07)
Basophils Absolute: 0 10*3/uL (ref 0.0–0.1)
Basophils Relative: 0 %
Eosinophils Absolute: 0 10*3/uL (ref 0.0–0.5)
Eosinophils Relative: 0 %
HCT: 58.8 % — ABNORMAL HIGH (ref 36.0–46.0)
Hemoglobin: 18.5 g/dL — ABNORMAL HIGH (ref 12.0–15.0)
Immature Granulocytes: 0 %
Lymphocytes Relative: 26 %
Lymphs Abs: 1.8 10*3/uL (ref 0.7–4.0)
MCH: 32.3 pg (ref 26.0–34.0)
MCHC: 31.5 g/dL (ref 30.0–36.0)
MCV: 102.8 fL — ABNORMAL HIGH (ref 80.0–100.0)
Monocytes Absolute: 0.9 10*3/uL (ref 0.1–1.0)
Monocytes Relative: 13 %
Neutro Abs: 4.1 10*3/uL (ref 1.7–7.7)
Neutrophils Relative %: 61 %
Platelets: 171 10*3/uL (ref 150–400)
RBC: 5.72 MIL/uL — ABNORMAL HIGH (ref 3.87–5.11)
RDW: 15.1 % (ref 11.5–15.5)
WBC: 6.8 10*3/uL (ref 4.0–10.5)
nRBC: 0 % (ref 0.0–0.2)

## 2021-03-03 LAB — D-DIMER, QUANTITATIVE: D-Dimer, Quant: 0.44 ug/mL-FEU (ref 0.00–0.50)

## 2021-03-03 LAB — HIV ANTIBODY (ROUTINE TESTING W REFLEX): HIV Screen 4th Generation wRfx: NONREACTIVE

## 2021-03-03 LAB — C-REACTIVE PROTEIN: CRP: 0.7 mg/dL (ref <1.0)

## 2021-03-03 LAB — RESP PANEL BY RT-PCR (FLU A&B, COVID) ARPGX2
Influenza A by PCR: NEGATIVE
Influenza B by PCR: NEGATIVE
SARS Coronavirus 2 by RT PCR: POSITIVE — AB

## 2021-03-03 LAB — TROPONIN I (HIGH SENSITIVITY): Troponin I (High Sensitivity): 10 ng/L (ref <18)

## 2021-03-03 MED ORDER — ALBUTEROL SULFATE HFA 108 (90 BASE) MCG/ACT IN AERS
4.0000 | INHALATION_SPRAY | Freq: Once | RESPIRATORY_TRACT | Status: AC
Start: 2021-03-03 — End: 2021-03-03
  Administered 2021-03-03: 4 via RESPIRATORY_TRACT
  Filled 2021-03-03: qty 6.7

## 2021-03-03 MED ORDER — ACETAMINOPHEN 325 MG PO TABS
650.0000 mg | ORAL_TABLET | Freq: Four times a day (QID) | ORAL | Status: DC | PRN
  Administered 2021-03-06 – 2021-03-09 (×3): 650 mg via ORAL
  Filled 2021-03-03 (×4): qty 2

## 2021-03-03 MED ORDER — DEXAMETHASONE SODIUM PHOSPHATE 10 MG/ML IJ SOLN
6.0000 mg | Freq: Once | INTRAMUSCULAR | Status: AC
Start: 2021-03-03 — End: 2021-03-03
  Administered 2021-03-03: 6 mg via INTRAVENOUS
  Filled 2021-03-03: qty 1

## 2021-03-03 MED ORDER — NICOTINE 7 MG/24HR TD PT24
7.0000 mg | MEDICATED_PATCH | Freq: Every day | TRANSDERMAL | Status: DC
  Administered 2021-03-03 – 2021-03-09 (×8): 7 mg via TRANSDERMAL
  Filled 2021-03-03 (×8): qty 1

## 2021-03-03 MED ORDER — UMECLIDINIUM BROMIDE 62.5 MCG/ACT IN AEPB
1.0000 | INHALATION_SPRAY | Freq: Every day | RESPIRATORY_TRACT | Status: DC
  Administered 2021-03-04 – 2021-03-05 (×2): 1 via RESPIRATORY_TRACT
  Filled 2021-03-03: qty 7

## 2021-03-03 MED ORDER — DEXAMETHASONE 6 MG PO TABS
6.0000 mg | ORAL_TABLET | ORAL | Status: DC
  Administered 2021-03-04 – 2021-03-07 (×4): 6 mg via ORAL
  Filled 2021-03-03 (×4): qty 1

## 2021-03-03 MED ORDER — SODIUM CHLORIDE 0.9 % IV BOLUS
500.0000 mL | Freq: Once | INTRAVENOUS | Status: AC
  Administered 2021-03-03: 500 mL via INTRAVENOUS

## 2021-03-03 MED ORDER — ACETAMINOPHEN 325 MG PO TABS: 650.0000 mg | ORAL_TABLET | Freq: Four times a day (QID) | ORAL | Status: DC | PRN

## 2021-03-03 MED ORDER — IOHEXOL 350 MG/ML SOLN
69.0000 mL | Freq: Once | INTRAVENOUS | Status: AC | PRN
  Administered 2021-03-03: 69 mL via INTRAVENOUS

## 2021-03-03 MED ORDER — SODIUM CHLORIDE 0.9 % IV SOLN
200.0000 mg | Freq: Once | INTRAVENOUS | Status: AC
  Administered 2021-03-03: 200 mg via INTRAVENOUS
  Filled 2021-03-03: qty 40

## 2021-03-03 MED ORDER — ENOXAPARIN SODIUM 40 MG/0.4ML IJ SOSY
40.0000 mg | PREFILLED_SYRINGE | INTRAMUSCULAR | Status: DC
  Administered 2021-03-03 – 2021-03-08 (×6): 40 mg via SUBCUTANEOUS
  Filled 2021-03-03 (×6): qty 0.4

## 2021-03-03 MED ORDER — SODIUM CHLORIDE 0.9 % IV SOLN
100.0000 mg | Freq: Every day | INTRAVENOUS | Status: AC
  Administered 2021-03-04 – 2021-03-07 (×4): 100 mg via INTRAVENOUS
  Filled 2021-03-03 (×2): qty 20
  Filled 2021-03-03: qty 100
  Filled 2021-03-03: qty 20

## 2021-03-03 MED ORDER — ALBUTEROL SULFATE HFA 108 (90 BASE) MCG/ACT IN AERS
2.0000 | INHALATION_SPRAY | RESPIRATORY_TRACT | Status: DC | PRN
  Administered 2021-03-05 (×2): 2 via RESPIRATORY_TRACT
  Filled 2021-03-03 (×2): qty 6.7

## 2021-03-03 MED ORDER — ACETAMINOPHEN 500 MG PO TABS
1000.0000 mg | ORAL_TABLET | Freq: Once | ORAL | Status: AC
  Administered 2021-03-03: 1000 mg via ORAL
  Filled 2021-03-03: qty 2

## 2021-03-03 MED ORDER — DEXAMETHASONE 4 MG PO TABS: 6.0000 mg | ORAL_TABLET | ORAL | Status: DC

## 2021-03-03 NOTE — Progress Notes (Signed)
New Admission Note:   Arrival Method: Arrived from [facility] ED via stretcher. Mental Orientation: Alert and oriented x4 Telemetry: N/A Assessment: Completed Skin: intact IV: NSL-Rt AC Pain: 0/10 Tubes: N/A Safety Measures: Safety Fall Prevention Plan has been discussed.  Admission: Completed 5MW Orientation: Patient has been oriented to the room, unit and staff.  Family: None at bedside  Orders have been reviewed and implemented. Will continue to monitor the patient. Call light has been placed within reach and bed alarm has been activated.   Marien Manship [employer], RN-BC Phone number: [phone]

## 2021-03-03 NOTE — Hospital Course (Addendum)
Michele [name] is a 50 year old female was admitted to [place] presenting with shortness of breath and was found to be COVID-positive. Hospital Course is outlined below:   Acute Hypoxemic Hypercarbic Respiratory Failure   COVID-19 Patient initially arrived at the ED and had very low oxygen saturation on room air of 68%.  Of note, the patient also had VBG that showed fairly significant hypercarbia to 80 with a pH of 7.3.  Patient was found to be COVID-positive.  On CT scan there appeared to be underlying lung disease, likely emphysema.  Given COVID diagnosis, patient was started on remdesivir for 5 days and Decadron for total steroid of 10 days in addition to retrieving inflammatory labs.  Decadron was transitioned to Solu-Medrol which was transitioned to prednisone prior to discharge.  When the patient was sleeping throughout the night, she would become very confused and VBG showed increasing hypercarbia.  We consulted pulmonology and they recommended to continue with BiPAP, BiPAP was used at night and as needed during the day.  Additionally, both chest x-rays did not show any evidence of pneumonia or focal consolidation.  1 dose of IV Lasix was given for the patient to improve oxygenation. Throughout the course of her stay, she was able to wean down to 2 L of oxygen while ambulating and maintained an oxygen saturation of 88 to 92%.  Ultimately, the patient met qualifications to take BiPAP home to use at night and was instructed to continue with oxygen at 2 L during the day with close follow-up.  Anxious mood Patient was started on Atarax 25 mg 3 times daily as needed and sertraline 25 mg to assist with her anxiety surrounding her diagnosis.  She seems to have this chronic anxiety, so we instructed her to follow-up with the PCP outpatient.  Discharged the patient on Atarax and sertraline as above.  Suspected COPD Patient does not have a PCP, so she has never received PFTs.  She has a long standing history of  tobacco use with presence of hypercarbia and polycythemia on this admission.  Likely that this patient has COPD.  She needs PFTs outpatient.  We will send her home on Spiriva in addition to albuterol.  Ultimately, the patient was transitioned from Solu-Medrol to prednisone outpatient as well.  OSA This is an informal diagnosis this patient needs a sleep study outpatient.  As mentioned above, the patient has fairly significant hypercarbia that worsens while sleeping.  She qualified for home BiPAP which we are sending with her.    Diabetes A1c on admission was 6.5.  Use sliding scale insulin to assist with sugars.  She needs to see PCP for diabetes management.   Items for Follow Up: Hospital follow-up in 1 week, on home oxygen and BiPAP Suspected COPD, benefit from PFTs Suspected OSA, benefit from sleep study outpatient Diabetes, needs diabetic management outpatient Follow-up on anxiety, sent home with Atarax and sertraline

## 2021-03-03 NOTE — H&P (Addendum)
[facility] Admission History and Physical Service Pager: [phone]  Patient name: Michele [name] Medical record number: [id number] Date of birth: 1970-10-24 Age: 50 y.o. Gender: female  Primary Care Provider: Patient, No Pcp Per (Inactive) Consultants: None Code Status: Full Preferred Emergency Contact: Winnifred Friar [phone]  Chief Complaint: Shortness of Breath  Assessment and Plan: Michele [name] is a 50 y.o. female presenting with fatigue, malaise, and fever found to be COVID positive. She has no significant medical history, but is an every day smoker (1/2 ppd).   COVID-19 Patient arrived to the ER after she was found to be hypoxic to 68% on RA at [facility]. On arrival, she was tachycardic, tachypneic, and hypoxic. Per the vitals documented, patient was initially placed on 25L O2 and rapidly weaned to 6L/min (ER provider documentation reports patient was on 4L and titrated up to 6L). Patient reported fever to 101.3 at home this morning, but was afebrile in the ER. CXR with bibasilar opacities likely due to atelectasis and prominence of the pulmonary arteries. CTA Chest obtained and was negative for PE. RVP positive for COVID. CBC with WBC 6.8, Hgb 18.5, Plt 171. In the ER, patient received decadron and an albuterol inhaler with improvement in symptoms. During admission evaluation, tachycardia has since resolved but patient remains intermittently tachypneic to the mid 20s. Patient received two COVID vaccines but did not receive any booster.  No evidence to suggest DVT on my exam today.  No evidence on labs or imaging to suggest superimposed bacterial pneumonia. Given patient's wheezing on ER provider exam, tobacco use, and emphysema on CTA, patient may need further evaluation for COPD and may require further inhaler treatment during hospitalization. - Admit to Med-Surg, Dr. Owens Shark attending - Supplemental O2 to maintain O2 sats greater than 90% - Remdesivir day  1/5 - Decadron day 1/10 - Will add D-Dimer; CRP to admission labs - Trend daily CBC, CMP, D-dimer, and CRP - Incentive spirometer and flutter valve - Tylenol 650 mg every 6 hours as needed for pain and fever  Symptoms Concerning for OSA Patient with a history of morning headaches, excessive somnolence, snoring, and waking up gasping for air. - Recommend outpatient sleep study on discharge - TOC consult for PCP needs  Aortic Atherosclerosis Identified on CTA No PCP and no home medications. - Recommend outpatient follow-up for cardiovascular risk factor modulating therapies   Tobacco Use  30 year history. Smokes 1/2 ppd. Emphysema noted on CTA.  - Nicotine patch  - We will need outpatient follow-up with PCP  FEN/GI: Regular diet Prophylaxis: Lovenox  Disposition: Med-Surg  History of Present Illness:  Michele [name] is a 50 y.o. female presenting with malaise, fatigue, and fever at home, found to be COVID positive at urgent care.   Patient says that she has been "dragging" since April and has not quite felt like her self.  She reports daytime sleepiness, generalized malaise/fatigue that became acutely worse about 4 days ago.  At that time she took a home COVID test that was negative.  She went to work yesterday and found that she was unable to complete her tasks and went home to go to bed early.  This morning when she woke up she felt even worse and elected to go to urgent care.  She checked her temperature at home prior to going and found she was febrile to 101.3.  She also noticed looking in the mirror that her "lips and earlobes were blue." Her son and  his wife also both have COVID and were diagnosed 1 to 2 weeks ago.  She did receive the first 2 COVID vaccines but did not get any boosters.  She does not have a PCP and does not have any regular medical care.  She does not take any medications daily.  She had been using acetaminophen regularly for her headaches but stopped about a  week ago because she had a nosebleed because she thought that the 2 are related. She reports that she often wakes up with headaches and that she often awakens at night with feeling that she is choking and gasping for air.  She also snores. She also reports possible leg swelling on the left side 2 weeks ago where her socks left a marked indent that was alarming to her.  However her symptoms abated on their own and she no longer has any edema or pain in this extremity.    Review Of Systems: Per HPI with the following additions:   Review of Systems  Constitutional:  Positive for activity change, chills (4 days ago), fatigue and fever.  HENT:  Negative for sore throat.   Respiratory:  Positive for cough. Negative for shortness of breath.   Cardiovascular:  Negative for chest pain.  Gastrointestinal:  Positive for diarrhea. Negative for blood in stool, nausea and vomiting.  Neurological:  Positive for weakness and numbness (through her left thumb).    Patient Active Problem List   Diagnosis Date Noted   Fibroid 04/07/2015    Past Medical History: Past Medical History:  Diagnosis Date   Medical history non-contributory     Past Surgical History: Past Surgical History:  Procedure Laterality Date   BILATERAL SALPINGECTOMY  04/07/2015   Procedure: BILATERAL SALPINGECTOMY with left oophorectomy, left ovarian cystectomy and removal of pelvic mass;  Surgeon: Frederico Hamman, MD;  Location: [facility];  Service: Gynecology;;   CESAREAN SECTION     WISDOM TOOTH EXTRACTION      Social History: Social History   Tobacco Use   Smoking status: Every Day    Packs/day: 0.50    Types: Cigarettes   Smokeless tobacco: Never  Substance Use Topics   Alcohol use: Yes    Comment: occassional   Drug use: No   Additional social history:  Smokes about 1/2 PPD x30 years, would like a nicotine patch Alcohol use: about 1 drink per week Drugs: THC use about 2-3 nights ago (to help with sleep), 2 days  ago said that she had a joint of THC that she didn't know initially was laced with cocaine.  Family History: Family History  Problem Relation Age of Onset   Cancer Mother    Cancer Maternal Aunt    Cancer Maternal Uncle   Mother - cervical cancer Grandmother - CHF Grandfather - "throat cancer" Maternal family history of breast cancer as well.  Allergies and Medications: No Known Allergies No current facility-administered medications on file prior to encounter.   Current Outpatient Medications on File Prior to Encounter  Medication Sig Dispense Refill   acetaminophen (TYLENOL) 500 MG tablet Take 500 mg by mouth every 6 (six) hours as needed for mild pain.      Objective: BP 124/64    Pulse 88    Temp 99 F (37.2 C)    Resp (!) 22    LMP 03/20/2009 (Within Years)    SpO2 92%  Exam: General: Alert, interactive, NAD Eyes: Conjunctivae injected, no scleral icterus, EOMs intact ENTM: Mucous membranes are  moist Neck: Supple, no thyromegaly Cardiovascular: Regular rate, regular rhythm, no murmur Respiratory: Normal work of breathing on 6 L nasal cannula, able to speak in full sentences.  Coarse lung sounds throughout all lung fields.  No wheezing to my exam. Gastrointestinal: Obese abdomen, soft, nontender MSK: Trace nonpitting edema to bilateral ankles Derm: Warm, dry, good turgor Neuro: Without focal deficit Psych: Mood and affect are appropriate  Labs and Imaging: CBC BMET  Recent Labs  Lab 03/03/21 1400  WBC 6.8  HGB 18.5*  HCT 58.8*  PLT 171   Recent Labs  Lab 03/03/21 1400  NA 138  K 3.9  CL 95*  CO2 37*  BUN <5*  CREATININE 0.75  GLUCOSE 116*  CALCIUM 8.2*     EKG: Sinus rhythm, QTc 448, no ST elevation or depression  CT Angio Chest PE W and/or Wo Contrast CLINICAL DATA:  Chest pain, short of breath  EXAM: CT ANGIOGRAPHY CHEST WITH CONTRAST  TECHNIQUE: Multidetector CT imaging of the chest was performed using the standard protocol during bolus  administration of intravenous contrast. Multiplanar CT image reconstructions and MIPs were obtained to evaluate the vascular anatomy.  CONTRAST:  52mL OMNIPAQUE IOHEXOL 350 MG/ML SOLN  COMPARISON:  03/03/2021  FINDINGS: Cardiovascular: This is a technically adequate evaluation of the pulmonary vasculature. No filling defects or pulmonary emboli.  Mild cardiomegaly without pericardial effusion. Normal caliber of the thoracic aorta with no evidence of aneurysm or dissection. Mild atherosclerosis.  Mediastinum/Nodes: No enlarged mediastinal, hilar, or axillary lymph nodes. Thyroid gland, trachea, and esophagus demonstrate no significant findings.  Lungs/Pleura: Upper lobe predominant emphysema. Minimal atelectasis within the left lower lobe. No acute airspace disease, effusion, or pneumothorax.  Upper Abdomen: No acute abnormality.  Musculoskeletal: No acute or destructive bony lesions. Reconstructed images demonstrate no additional findings.  Review of the MIP images confirms the above findings.  IMPRESSION: 1. No evidence of pulmonary embolus. 2. Mild cardiomegaly. 3. Aortic Atherosclerosis (ICD10-I70.0) and Emphysema (ICD10-J43.9).  Electronically Signed   By: Randa Ngo M.D.   On: 03/03/2021 16:13 DG Chest Portable 1 View CLINICAL DATA:  Shortness of breath  EXAM: PORTABLE CHEST 1 VIEW  COMPARISON:  None.  FINDINGS: Cardiac and mediastinal contours are within normal limits for AP portable technique. Prominence of the pulmonary arteries. Mild bibasilar opacities. The visualized skeletal structures are unremarkable.  IMPRESSION: 1. Mild bibasilar opacities, likely due to atelectasis. 2. Prominence of the pulmonary arteries, findings can be seen in the setting of pulmonary hypertension.  Electronically Signed   By: Yetta Glassman M.D.   On: 03/03/2021 13:54   Eppie Gibson, MD 03/03/2021, 4:35 PM PGY-1, [place] Intern  pager: [phone], text pages welcome      FPTS Upper-Level Resident Addendum   I have independently interviewed and examined the patient. I have discussed the above with the original author and agree with their documentation. My edits for correction/addition/clarification are in within the document. Please see also any attending notes.   Rise Patience, DO  PGY-2, [facility] 03/03/2021 6:27 PM  [facility] Service pager: [phone] (text pages welcome through [facility])

## 2021-03-03 NOTE — ED Triage Notes (Signed)
Pt here from UC with oxygen saturations of 68% on RA. Pt states she has not felt well for approx 2 weeks. Went to UC and was told she was covid + along with low oxygen.

## 2021-03-03 NOTE — ED Notes (Signed)
Increased O2 by provider to 6LPM at this time. Will continue to monitor.

## 2021-03-03 NOTE — ED Provider Notes (Signed)
[facility] EMERGENCY DEPARTMENT Provider Note   CSN: [id number] Arrival date & time: 03/03/21  1241     History Chief Complaint  Patient presents with   Shortness of Breath   Covid Positive    Michele [name] is a 50 y.o. female with no reported past medical history or current medications.  Presents emergency department with a chief complaint of COVID-19 and low oxygen saturation.  Patient reports that she has been feeling ill for the last 2 weeks.  Over this time she has been having headache, decreased appetite, diarrhea, and chills.  Patient reports that her headache, diarrhea, and decreased appetite have improved.  Patient states that yesterday she had a T-max of 23 F orally.  Patient has also had increased fatigue and cough.  Patient reports that cough is minimally productive.  Cough is producing clear mucus.  Patient was exposed to COVID-19 from her son.  Patient tested positive for COVID-19 earlier today at urgent care.  Patient states that she took 2 at home because of the last 2 weeks which were negative.  Patient has been vaccinated for COVID-19 however did not receive booster.  Patient states that yesterday she noticed that her lips had a blue hue.  Patient denies any home oxygen use.  Denies any chest pain, shortness of breath, palpitations, leg swelling or tenderness, history of DVT/PE, hormone therapy, surgery in the last 4 weeks, cancer treatment, abdominal pain, nausea, vomiting.      Shortness of Breath Associated symptoms: cough, fever and headaches   Associated symptoms: no abdominal pain, no chest pain, no neck pain, no rash and no vomiting       Past Medical History:  Diagnosis Date   Medical history non-contributory     Patient Active Problem List   Diagnosis Date Noted   Fibroid 04/07/2015    Past Surgical History:  Procedure Laterality Date   BILATERAL SALPINGECTOMY  04/07/2015   Procedure: BILATERAL SALPINGECTOMY with left  oophorectomy, left ovarian cystectomy and removal of pelvic mass;  Surgeon: Frederico Hamman, MD;  Location: [facility];  Service: Gynecology;;   CESAREAN SECTION     WISDOM TOOTH EXTRACTION       OB History     Gravida  2   Para  2   Term  2   Preterm  0   AB  0   Living  2      SAB  0   IAB  0   Ectopic  0   Multiple  0   Live Births              Family History  Problem Relation Age of Onset   Cancer Mother    Cancer Maternal Aunt    Cancer Maternal Uncle     Social History   Tobacco Use   Smoking status: Every Day    Packs/day: 0.50    Types: Cigarettes   Smokeless tobacco: Never  Substance Use Topics   Alcohol use: Yes    Comment: occassional   Drug use: No    Home Medications Prior to Admission medications   Not on File    Allergies    Patient has no known allergies.  Review of Systems   Review of Systems  Constitutional:  Positive for chills, fatigue and fever.  Eyes:  Negative for visual disturbance.  Respiratory:  Positive for cough. Negative for shortness of breath.   Cardiovascular:  Negative for chest pain, palpitations and leg  swelling.  Gastrointestinal:  Positive for diarrhea. Negative for abdominal pain, nausea and vomiting.  Genitourinary:  Negative for difficulty urinating, dysuria, hematuria and urgency.  Musculoskeletal:  Negative for back pain and neck pain.  Skin:  Negative for color change and rash.  Neurological:  Positive for headaches. Negative for dizziness, tremors, seizures, syncope, facial asymmetry, speech difficulty, weakness, light-headedness and numbness.  Psychiatric/Behavioral:  Negative for confusion.    Physical Exam Updated Vital Signs BP (!) 146/102 (BP Location: Left Arm)    Pulse (!) 102    Temp 98.4 F (36.9 C)    Resp (!) 22    LMP 03/20/2009 (Within Years)    SpO2 99%   Physical Exam Vitals and nursing note reviewed.  Constitutional:      General: She is not in acute distress.     Appearance: She is obese. She is ill-appearing. She is not toxic-appearing or diaphoretic.     Interventions: Nasal cannula in place.     Comments: 4 LPM of O2 via nasal cannula  HENT:     Head: Normocephalic.  Eyes:     General: No scleral icterus.       Right eye: No discharge.        Left eye: No discharge.  Cardiovascular:     Rate and Rhythm: Normal rate.  Pulmonary:     Effort: Pulmonary effort is normal. Tachypnea present. No bradypnea or respiratory distress.     Comments: Speaks in full complete sentences without difficulty.  Coarse lung sounds and minimal expiratory wheezing throughout all lung fields Abdominal:     General: There is no distension. There are no signs of injury.     Palpations: Abdomen is soft. There is no mass.     Tenderness: There is no abdominal tenderness. There is no guarding or rebound.  Musculoskeletal:     Right lower leg: No swelling, deformity, lacerations, tenderness or bony tenderness. Edema present.     Left lower leg: No swelling, deformity, lacerations, tenderness or bony tenderness. Edema present.     Comments: Trace edema to bilateral lower extremities, no erythema or warmth.  Skin:    General: Skin is warm and dry.  Neurological:     General: No focal deficit present.     Mental Status: She is alert.  Psychiatric:        Behavior: Behavior is cooperative.    ED Results / Procedures / Treatments   Labs (all labs ordered are listed, but only abnormal results are displayed) Labs Reviewed  RESP PANEL BY RT-PCR (FLU A&B, COVID) ARPGX2 - Abnormal; Notable for the following components:      Result Value   SARS Coronavirus 2 by RT PCR POSITIVE (*)    All other components within normal limits  COMPREHENSIVE METABOLIC PANEL - Abnormal; Notable for the following components:   Chloride 95 (*)    CO2 37 (*)    Glucose, Bld 116 (*)    BUN <5 (*)    Calcium 8.2 (*)    Albumin 3.4 (*)    All other components within normal limits  CBC WITH  DIFFERENTIAL/PLATELET - Abnormal; Notable for the following components:   RBC 5.72 (*)    Hemoglobin 18.5 (*)    HCT 58.8 (*)    MCV 102.8 (*)    All other components within normal limits  TROPONIN I (HIGH SENSITIVITY)    EKG EKG Interpretation  Date/Time:  Thursday March 03 2021 13:00:48 EST Ventricular Rate:  99  PR Interval:  142 QRS Duration: 75 QT Interval:  349 QTC Calculation: 448 R Axis:   152 Text Interpretation: Sinus rhythm Probable left atrial enlargement Anterolateral infarct, old No prior ECG for comparison. No STEMI Confirmed by Antony Blackbird [id number]) on 03/03/2021 1:21:07 PM  Radiology CT Angio Chest PE W and/or Wo Contrast  Result Date: 03/03/2021 CLINICAL DATA:  Chest pain, short of breath EXAM: CT ANGIOGRAPHY CHEST WITH CONTRAST TECHNIQUE: Multidetector CT imaging of the chest was performed using the standard protocol during bolus administration of intravenous contrast. Multiplanar CT image reconstructions and MIPs were obtained to evaluate the vascular anatomy. CONTRAST:  41mL OMNIPAQUE IOHEXOL 350 MG/ML SOLN COMPARISON:  03/03/2021 FINDINGS: Cardiovascular: This is a technically adequate evaluation of the pulmonary vasculature. No filling defects or pulmonary emboli. Mild cardiomegaly without pericardial effusion. Normal caliber of the thoracic aorta with no evidence of aneurysm or dissection. Mild atherosclerosis. Mediastinum/Nodes: No enlarged mediastinal, hilar, or axillary lymph nodes. Thyroid gland, trachea, and esophagus demonstrate no significant findings. Lungs/Pleura: Upper lobe predominant emphysema. Minimal atelectasis within the left lower lobe. No acute airspace disease, effusion, or pneumothorax. Upper Abdomen: No acute abnormality. Musculoskeletal: No acute or destructive bony lesions. Reconstructed images demonstrate no additional findings. Review of the MIP images confirms the above findings. IMPRESSION: 1. No evidence of pulmonary embolus. 2. Mild  cardiomegaly. 3. Aortic Atherosclerosis (ICD10-I70.0) and Emphysema (ICD10-J43.9). Electronically Signed   By: Randa Ngo M.D.   On: 03/03/2021 16:13   DG Chest Portable 1 View  Result Date: 03/03/2021 CLINICAL DATA:  Shortness of breath EXAM: PORTABLE CHEST 1 VIEW COMPARISON:  None. FINDINGS: Cardiac and mediastinal contours are within normal limits for AP portable technique. Prominence of the pulmonary arteries. Mild bibasilar opacities. The visualized skeletal structures are unremarkable. IMPRESSION: 1. Mild bibasilar opacities, likely due to atelectasis. 2. Prominence of the pulmonary arteries, findings can be seen in the setting of pulmonary hypertension. Electronically Signed   By: Yetta Glassman M.D.   On: 03/03/2021 13:54    Procedures .Critical Care Performed by: Loni Beckwith, PA-C Authorized by: Loni Beckwith, PA-C   Critical care provider statement:    Critical care time (minutes):  30   Critical care was necessary to treat or prevent imminent or life-threatening deterioration of the following conditions:  Respiratory failure   Critical care was time spent personally by me on the following activities:  Development of treatment plan with patient or surrogate, evaluation of patient's response to treatment, examination of patient, ordering and review of laboratory studies, ordering and review of radiographic studies, ordering and performing treatments and interventions, pulse oximetry, re-evaluation of patient's condition and review of old charts   Care discussed with: admitting provider   Comments:     Acute respiratory failure with hypoxia secondary to COVID-19   Medications Ordered in ED Medications  dexamethasone (DECADRON) injection 6 mg (6 mg Intravenous Given 03/03/21 1356)  albuterol (VENTOLIN HFA) 108 (90 Base) MCG/ACT inhaler 4 puff (4 puffs Inhalation Given 03/03/21 1357)  sodium chloride 0.9 % bolus 500 mL (500 mLs Intravenous New Bag/Given 03/03/21  1525)  acetaminophen (TYLENOL) tablet 1,000 mg (1,000 mg Oral Given 03/03/21 1526)  iohexol (OMNIPAQUE) 350 MG/ML injection 69 mL (69 mLs Intravenous Contrast Given 03/03/21 1607)    ED Course  I have reviewed the triage vital signs and the nursing notes.  Pertinent labs & imaging results that were available during my care of the patient were reviewed by me and considered in my medical decision  making (see chart for details).  Clinical Course as of 03/03/21 1636  Thu Mar 03, 2021  1420 BP: 93/75 [PB]  1421 Blood pressure 1400 was documented as 93/75, after entering patient's room blood pressure cuff was found to be in an improper position.  Blood pressure was repositioned and blood pressure found to have systolic of 026. [PB]  3785 Spoke to Dr. Pearla Dubonnet with family medicine who will see the patient for admission. [PB]    Clinical Course User Index [PB] Dyann Ruddle   MDM Rules/Calculators/A&P                           Alert 50 year old female no acute distress, nontoxic-appearing.  Patient does appear ill.  Presents to ED with chief complaint of COVID-19 and decreased SPO2.  Per triage note patient had oxygen saturations of 60% on room air at urgent care.  Upon my assessment patient has 4 LPM of O2 via nasal cannula in place with oxygen saturation 88 to 90%.  Patient denies any home oxygen use.  Patient is in no acute respiratory distress, speaks in full complete sentences.  Denies any chest pain or shortness of breath.  With reports of positive COVID-19 suspect hypoxia secondary to COVID-19 pneumonia.  Will obtain chest x-ray to evaluate for possible bacterial pneumonia in the setting of symptoms x2 weeks.  Due to patient's decreased SPO2, tachycardia, and COVID-19 infection concern for possible PE.  Will obtain CTA of chest to evaluate further.  We will start patient on prednisone and administer albuterol inhaler due to minimal expiratory wheezing noted on lung  sounds.   Due to patient's new oxygen requirement patient will need admission.  Patient positive for COVID-19. CBC shows hemoconcentration likely secondary to dehydration and patient's daily tobacco use. BMP shows no large electrolyte abnormalities. Troponin within normal limits  Chest x-ray shows mild bibasilar opacities, likely due to atelectasis.  Prominence of the pulmonary arteries, can be seen in the setting of pulmonary hypertension. CTA shows no evidence of PE, mild cardiomegaly.  Patient oxygen saturation 90% or greater on 6 LPM of O2.  Patient had improvement in wheezing after receiving albuterol inhaler.  Spoke to hospitalist who will see the patient for admission.  Patient care discussed with attending physician Dr. Sherry Ruffing.  Final Clinical Impression(s) / ED Diagnoses Final diagnoses:  Acute respiratory failure due to COVID-19 [facility])    Rx / DC Orders ED Discharge Orders     None        Loni Beckwith, PA-C 03/03/21 1637    Tegeler, Gwenyth Allegra, MD 03/06/21 [phone]

## 2021-03-03 NOTE — Progress Notes (Signed)
Charge Nurse was informed by lab that patient's PCO2=82.5 Patient not in any acute distress.  Text paged  MD on call who came to assess patient. No new orders.

## 2021-03-04 ENCOUNTER — Observation Stay (HOSPITAL_COMMUNITY): Payer: 59

## 2021-03-04 ENCOUNTER — Other Ambulatory Visit: Payer: Self-pay

## 2021-03-04 ENCOUNTER — Encounter (HOSPITAL_COMMUNITY): Payer: Self-pay | Admitting: Family Medicine

## 2021-03-04 DIAGNOSIS — F419 Anxiety disorder, unspecified: Secondary | ICD-10-CM | POA: Diagnosis present

## 2021-03-04 DIAGNOSIS — J9602 Acute respiratory failure with hypercapnia: Secondary | ICD-10-CM | POA: Diagnosis present

## 2021-03-04 DIAGNOSIS — U071 COVID-19: Secondary | ICD-10-CM | POA: Diagnosis present

## 2021-03-04 DIAGNOSIS — J439 Emphysema, unspecified: Secondary | ICD-10-CM | POA: Diagnosis present

## 2021-03-04 DIAGNOSIS — L304 Erythema intertrigo: Secondary | ICD-10-CM | POA: Diagnosis not present

## 2021-03-04 DIAGNOSIS — J1282 Pneumonia due to coronavirus disease 2019: Secondary | ICD-10-CM | POA: Diagnosis present

## 2021-03-04 DIAGNOSIS — Z90721 Acquired absence of ovaries, unilateral: Secondary | ICD-10-CM | POA: Diagnosis not present

## 2021-03-04 DIAGNOSIS — Z8249 Family history of ischemic heart disease and other diseases of the circulatory system: Secondary | ICD-10-CM | POA: Diagnosis not present

## 2021-03-04 DIAGNOSIS — J441 Chronic obstructive pulmonary disease with (acute) exacerbation: Secondary | ICD-10-CM | POA: Diagnosis not present

## 2021-03-04 DIAGNOSIS — E871 Hypo-osmolality and hyponatremia: Secondary | ICD-10-CM | POA: Diagnosis not present

## 2021-03-04 DIAGNOSIS — G929 Unspecified toxic encephalopathy: Secondary | ICD-10-CM | POA: Diagnosis not present

## 2021-03-04 DIAGNOSIS — R0609 Other forms of dyspnea: Secondary | ICD-10-CM

## 2021-03-04 DIAGNOSIS — Z8049 Family history of malignant neoplasm of other genital organs: Secondary | ICD-10-CM | POA: Diagnosis not present

## 2021-03-04 DIAGNOSIS — Z6839 Body mass index (BMI) 39.0-39.9, adult: Secondary | ICD-10-CM | POA: Diagnosis not present

## 2021-03-04 DIAGNOSIS — G4733 Obstructive sleep apnea (adult) (pediatric): Secondary | ICD-10-CM | POA: Diagnosis not present

## 2021-03-04 DIAGNOSIS — J9600 Acute respiratory failure, unspecified whether with hypoxia or hypercapnia: Secondary | ICD-10-CM | POA: Diagnosis not present

## 2021-03-04 DIAGNOSIS — E119 Type 2 diabetes mellitus without complications: Secondary | ICD-10-CM | POA: Diagnosis present

## 2021-03-04 DIAGNOSIS — D751 Secondary polycythemia: Secondary | ICD-10-CM | POA: Diagnosis present

## 2021-03-04 DIAGNOSIS — Z803 Family history of malignant neoplasm of breast: Secondary | ICD-10-CM | POA: Diagnosis not present

## 2021-03-04 DIAGNOSIS — Z808 Family history of malignant neoplasm of other organs or systems: Secondary | ICD-10-CM | POA: Diagnosis not present

## 2021-03-04 DIAGNOSIS — J9601 Acute respiratory failure with hypoxia: Secondary | ICD-10-CM | POA: Diagnosis present

## 2021-03-04 DIAGNOSIS — Z720 Tobacco use: Secondary | ICD-10-CM | POA: Diagnosis not present

## 2021-03-04 DIAGNOSIS — F17210 Nicotine dependence, cigarettes, uncomplicated: Secondary | ICD-10-CM | POA: Diagnosis present

## 2021-03-04 DIAGNOSIS — I700 Atherosclerosis of aorta: Secondary | ICD-10-CM | POA: Diagnosis present

## 2021-03-04 DIAGNOSIS — Z716 Tobacco abuse counseling: Secondary | ICD-10-CM | POA: Diagnosis not present

## 2021-03-04 DIAGNOSIS — E662 Morbid (severe) obesity with alveolar hypoventilation: Secondary | ICD-10-CM | POA: Diagnosis present

## 2021-03-04 DIAGNOSIS — R0689 Other abnormalities of breathing: Secondary | ICD-10-CM | POA: Diagnosis not present

## 2021-03-04 LAB — CBC WITH DIFFERENTIAL/PLATELET
Abs Immature Granulocytes: 0.05 10*3/uL (ref 0.00–0.07)
Basophils Absolute: 0 10*3/uL (ref 0.0–0.1)
Basophils Relative: 0 %
Eosinophils Absolute: 0 10*3/uL (ref 0.0–0.5)
Eosinophils Relative: 0 %
HCT: 58.1 % — ABNORMAL HIGH (ref 36.0–46.0)
Hemoglobin: 18 g/dL — ABNORMAL HIGH (ref 12.0–15.0)
Immature Granulocytes: 1 %
Lymphocytes Relative: 23 %
Lymphs Abs: 1.5 10*3/uL (ref 0.7–4.0)
MCH: 32 pg (ref 26.0–34.0)
MCHC: 31 g/dL (ref 30.0–36.0)
MCV: 103.2 fL — ABNORMAL HIGH (ref 80.0–100.0)
Monocytes Absolute: 0.6 10*3/uL (ref 0.1–1.0)
Monocytes Relative: 9 %
Neutro Abs: 4.5 10*3/uL (ref 1.7–7.7)
Neutrophils Relative %: 67 %
Platelets: 132 10*3/uL — ABNORMAL LOW (ref 150–400)
RBC: 5.63 MIL/uL — ABNORMAL HIGH (ref 3.87–5.11)
RDW: 14.9 % (ref 11.5–15.5)
WBC: 6.7 10*3/uL (ref 4.0–10.5)
nRBC: 0 % (ref 0.0–0.2)

## 2021-03-04 LAB — GLUCOSE, CAPILLARY
Glucose-Capillary: 127 mg/dL — ABNORMAL HIGH (ref 70–99)
Glucose-Capillary: 192 mg/dL — ABNORMAL HIGH (ref 70–99)

## 2021-03-04 LAB — COMPREHENSIVE METABOLIC PANEL
ALT: 33 U/L (ref 0–44)
AST: 33 U/L (ref 15–41)
Albumin: 3.4 g/dL — ABNORMAL LOW (ref 3.5–5.0)
Alkaline Phosphatase: 75 U/L (ref 38–126)
Anion gap: 5 (ref 5–15)
BUN: 5 mg/dL — ABNORMAL LOW (ref 6–20)
CO2: 36 mmol/L — ABNORMAL HIGH (ref 22–32)
Calcium: 7.9 mg/dL — ABNORMAL LOW (ref 8.9–10.3)
Chloride: 94 mmol/L — ABNORMAL LOW (ref 98–111)
Creatinine, Ser: 0.59 mg/dL (ref 0.44–1.00)
GFR, Estimated: >60 mL/min (ref >60)
Glucose, Bld: 126 mg/dL — ABNORMAL HIGH (ref 70–99)
Potassium: 5 mmol/L (ref 3.5–5.1)
Sodium: 135 mmol/L (ref 135–145)
Total Bilirubin: 1 mg/dL (ref 0.3–1.2)
Total Protein: 6.4 g/dL — ABNORMAL LOW (ref 6.5–8.1)

## 2021-03-04 LAB — ECHOCARDIOGRAM COMPLETE
Calc EF: 52.1 %
Height: 62 in
S' Lateral: 3.2 cm
Single Plane A2C EF: 59.3 %
Single Plane A4C EF: 40.3 %

## 2021-03-04 LAB — BRAIN NATRIURETIC PEPTIDE: B Natriuretic Peptide: 81.8 pg/mL (ref 0.0–100.0)

## 2021-03-04 LAB — D-DIMER, QUANTITATIVE: D-Dimer, Quant: 0.47 ug/mL-FEU (ref 0.00–0.50)

## 2021-03-04 LAB — C-REACTIVE PROTEIN: CRP: 0.6 mg/dL (ref <1.0)

## 2021-03-04 NOTE — Progress Notes (Signed)
°  Echocardiogram 2D Echocardiogram has been performed.  Merrie Roof F 03/04/2021, 1:25 PM

## 2021-03-04 NOTE — Plan of Care (Signed)
  Problem: Education: Goal: Knowledge of General Education information will improve Description Including pain rating scale, medication(s)/side effects and non-pharmacologic comfort measures Outcome: Progressing   Problem: Activity: Goal: Risk for activity intolerance will decrease Outcome: Progressing   Problem: Safety: Goal: Ability to remain free from injury will improve Outcome: Progressing   

## 2021-03-04 NOTE — TOC Initial Note (Signed)
Transition of Care [facility]) - Initial/Assessment Note    Patient Details  Name: Michele [name] MRN: [id number] Date of Birth: 13-Jul-1970  Transition of Care [facility]) CM/SW Contact:    Tom-Johnson, Renea Ee, RN Phone Number: 03/04/2021, 5:01 PM  Clinical Narrative:                 CM consulted for PCP needs and . spoke with patient to bedside. Patient is admitted for Covid positive. Patient states she lives alone and works at a [employer]. States she currently has [facility] but most PCP's do not accept. CM gave patient list of PCP's and Clinics from Medicare.gov and patient request to schedule followup appointment with [facility]. CM called [place] and scheduled post hospital followup appointment.Information on AVS. CM will continue to follow with needs.   Expected Discharge Plan: Home/Self Care Barriers to Discharge: Continued Medical Work up   Patient Goals and CMS Choice Patient states their goals for this hospitalization and ongoing recovery are:: To go home CMS Medicare.gov Compare Post Acute Care list provided to:: Patient    Expected Discharge Plan and Services Expected Discharge Plan: Home/Self Care   Discharge Planning Services: CM Consult Post Acute Care Choice: NA Living arrangements for the past 2 months: Single Family Home                                      Prior Living Arrangements/Services Living arrangements for the past 2 months: Single Family Home Lives with:: Self Patient language and need for interpreter reviewed:: Yes Do you feel safe going back to the place where you live?: Yes      Need for Family Participation in Patient Care: Yes (Comment) Care giver support system in place?: Yes (comment)   Criminal Activity/Legal Involvement Pertinent to Current Situation/Hospitalization: No - Comment as needed  Activities of Daily Living      Permission Sought/Granted Permission sought to share information with : Case Manager, Family  Supports, Customer service manager Permission granted to share information with : Yes, Verbal Permission Granted              Emotional Assessment Appearance:: Appears stated age Attitude/Demeanor/Rapport: Engaged, Gracious Affect (typically observed): Accepting, Appropriate, Calm, Hopeful Orientation: : Oriented to Self, Oriented to Place, Oriented to  Time, Oriented to Situation Alcohol / Substance Use: Not Applicable Psych Involvement: No (comment)  Admission diagnosis:  Acute respiratory failure due to COVID-19 ([place]) [U07.1, J96.00] COVID-19 [U07.1] Patient Active Problem List   Diagnosis Date Noted   COVID-19 03/04/2021   Acute respiratory failure due to COVID-19 ([place]) 03/03/2021   Tobacco use    Fibroid 04/07/2015   PCP:  Patient, No Pcp Per (Inactive) Pharmacy:   [place] ([place]), [place] - [address] [place] ([place]) [place] [address] Phone: [phone] Fax: [phone]     Social Determinants of Health (SDOH) Interventions    Readmission Risk Interventions No flowsheet data found.

## 2021-03-04 NOTE — Progress Notes (Signed)
RT attempted ABG X's 3. RT was unable to obtain sample. MD was notified. Orders were given to D/C ABG.

## 2021-03-04 NOTE — Progress Notes (Signed)
FPTS Brief Progress Note  S: Patient seen at bedside for evening rounds. She reports she's feeling somewhat better than when she came in. Denies shortness of breath or other complaints at the present time.  Patient recounts the events of the day- tells [place] she was seen in Urgent Care and was told to come to the ED because her oxygen was in the 60s. Patient states she drove herself and even stopped to get gas, eat a double cheeseburger, and smoke a cigarette on the way.   O: BP 103/71 (BP Location: Left Arm)    Pulse 71    Temp 98 F (36.7 C) (Oral)    Resp 20    Ht 5\' 2"  (1.575 m)    LMP 03/20/2009 (Within Years)    SpO2 96%    BMI 34.75 kg/m   Gen: alert, resting comfortably in bed, NAD Resp: normal work of breathing on 3L [place]. Speaking in very full sentences without difficulty. Slightly diminished breath sounds throughout. Diffuse inspiratory and expiratory wheezes noted.  A/P: Acute Hypoxemic Hypercarbic Respiratory Failure COVID VBG with pH 7.3 and pCO2 80. Patient clinically appears quite well and has been decreased from 6L to 3L [place]. Suspect there is a component of chronic hypercarbia given her normal pH and normal mentation with pCO2 of 80. Treating for COVID as well as presumed underlying COPD. -Supplemental O2, wean as tolerated -Goal SpO2 88-92% -Continue Decadron, Remdesevir -Incruse Ellipta daily, albuterol inhaler q4h prn  Remainder per H&P.  - Orders reviewed. Labs for AM ordered, which was adjusted as needed.    Alcus Dad, MD 03/04/2021, 12:02 AM PGY-2, [facility] Night Resident  Please page [phone] with questions.

## 2021-03-04 NOTE — Progress Notes (Signed)
Family Medicine Teaching Service Daily Progress Note Intern Pager: [phone]  Patient name: Michele [name] Medical record number: [id number] Date of birth: 17-Feb-1971 Age: 50 y.o. Gender: female  Primary Care Provider: Patient, No Pcp Per (Inactive) Consultants: None Code Status: Full Code   Pt Overview and Major Events to Date:  12/15: Admitted  Assessment and Plan: Michele [name] is a 50 y.o. female presented with acute respiratory failure 2/2 COVID. No pertinent PMHx as patient does not regularly follow a PCP. HD 1   Acute hypoxemic hypercarbic respiratory failure, improving   COVID day 2  Patient is on 3L [place] from 6L, remains afebrile, WBC 6.7. Down-trending inflammatory labs. BNP 81. She still feels poorly with fatigue, headaches, SOB and DOE.   Wean oxygen as tolerated with SPO2 goal 88-92% Trend inflammatory lab: D-dimer, CRP Daily CBC and CMP Remdesivir day 2/5 Decadron day 2/10 Incentive spirometer and flutter valve F/u ECHO F/u ABG  Possible COPD, improving Endorses DOE that has improved today.  Incruse Ellipta daily  Possible OSA Continues to endorse waking up in the middle of the night with nightmares that she finds very distressing. Likely present chronically as Hb 18. Will call respiratory to attempt to get patient a CPAP or BiPAP.  TOC for PCP Sleep study outpatient  A1c 6.5 A1c 6.5 (03/03/2021), last CBG 126 (IP CBG goal <180). Will continue to monitor given she is receiving daily steroids.  BMP F/u with PCP for repeat A1c CBGs  Stable & Chronic: Tobacco-use: Nicotine patch Aortic Atherosclerosis Identified on CTA Tobacco Use   FEN/GI:   Regular PPx: enoxaparin (LOVENOX) injection 40 mg Start: 03/03/21 1730 Dispo:Pending PT recommendations  in 3 or more days. Barriers include Clinical improvement.   Subjective:  Michele [name] was seen this AM. Stated that her SOB and DOE has improved, but she is waking up multiple times a night and having nightmares that  she is very distressed about.   Objective: Temp:  [97.5 F (36.4 C)-98.2 F (36.8 C)] 98.2 F (36.8 C) (12/16 1219) Pulse Rate:  [59-89] 70 (12/16 1219) Resp:  [17-22] 20 (12/16 1219) BP: (98-135)/(63-87) 135/83 (12/16 1219) SpO2:  [90 %-96 %] 90 % (12/16 1219) Physical Exam Vitals and nursing note reviewed.  Constitutional:      General: She is awake. She is not in acute distress.    Appearance: She is obese. She is not ill-appearing, toxic-appearing or diaphoretic.     Interventions: Nasal cannula in place.  HENT:     Head: Normocephalic and atraumatic.  Cardiovascular:     Rate and Rhythm: Normal rate and regular rhythm.     Pulses:          Dorsalis pedis pulses are 2+ on the right side and 2+ on the left side.     Heart sounds: No murmur heard.   No friction rub. No gallop.  Pulmonary:     Effort: Pulmonary effort is normal. No respiratory distress.     Breath sounds: Wheezing (Diffused expiratory) present. No rhonchi or rales.  Chest:     Chest wall: No tenderness.  Musculoskeletal:     Right lower leg: No tenderness. No edema.     Left lower leg: No tenderness. No edema.  Skin:    General: Skin is warm and dry.     Coloration: Skin is not cyanotic.     Nails: There is no clubbing.  Neurological:     General: No focal deficit present.     Mental  Status: She is alert, oriented to person, place, and time and easily aroused.  Psychiatric:        Mood and Affect: Mood is anxious.        Behavior: Behavior is cooperative.     Laboratory: Recent Labs  Lab 03/03/21 1400 03/03/21 1910 03/04/21 0303  WBC 6.8  --  6.7  HGB 18.5* 20.1* 18.0*  HCT 58.8* 59.0* 58.1*  PLT 171  --  132*   Recent Labs  Lab 03/03/21 1400 03/03/21 1910 03/04/21 0303  NA 138 137 135  K 3.9 4.2 5.0  CL 95*  --  94*  CO2 37*  --  36*  BUN <5*  --  5*  CREATININE 0.75  --  0.59  CALCIUM 8.2*  --  7.9*  PROT 6.5  --  6.4*  BILITOT 0.4  --  1.0  ALKPHOS 69  --  75  ALT 34  --  33   AST 30  --  33  GLUCOSE 116*  --  126*   Results for orders placed or performed during the hospital encounter of 03/03/21 (from the past 24 hour(s))  D-dimer, quantitative     Status: None   Collection Time: 03/03/21  4:35 PM  Result Value Ref Range   D-Dimer, Quant 0.44 0.00 - 0.50 ug/mL-FEU  C-reactive protein     Status: None   Collection Time: 03/03/21  4:35 PM  Result Value Ref Range   CRP 0.7 <1.0 mg/dL  HIV Antibody (routine testing w rflx)     Status: None   Collection Time: 03/03/21  5:15 PM  Result Value Ref Range   HIV Screen 4th Generation wRfx Non Reactive Non Reactive  Hemoglobin A1c     Status: Abnormal   Collection Time: 03/03/21  7:00 PM  Result Value Ref Range   Hgb A1c MFr Bld 6.5 (H) 4.8 - 5.6 %   Mean Plasma Glucose 139.85 mg/dL  I-Stat venous blood gas, ED     Status: Abnormal   Collection Time: 03/03/21  7:10 PM  Result Value Ref Range   pH, Ven 7.303 7.250 - 7.430   pCO2, Ven 80.4 (HH) 44.0 - 60.0 mmHg   pO2, Ven 79.0 (H) 32.0 - 45.0 mmHg   Bicarbonate 39.8 (H) 20.0 - 28.0 mmol/L   TCO2 42 (H) 22 - 32 mmol/L   O2 Saturation 93.0 %   Acid-Base Excess 8.0 (H) 0.0 - 2.0 mmol/L   Sodium 137 135 - 145 mmol/L   Potassium 4.2 3.5 - 5.1 mmol/L   Calcium, Ion 1.02 (L) 1.15 - 1.40 mmol/L   HCT 59.0 (H) 36.0 - 46.0 %   Hemoglobin 20.1 (H) 12.0 - 15.0 g/dL   Sample type VENOUS    Comment NOTIFIED PHYSICIAN   Blood gas, venous     Status: Abnormal   Collection Time: 03/03/21  9:48 PM  Result Value Ref Range   pH, Ven 7.277 7.250 - 7.430   pCO2, Ven 82.5 (HH) 44.0 - 60.0 mmHg   pO2, Ven 64.7 (H) 32.0 - 45.0 mmHg   Bicarbonate 37.3 (H) 20.0 - 28.0 mmol/L   Acid-Base Excess 10.5 (H) 0.0 - 2.0 mmol/L   O2 Saturation 91.1 %   Patient temperature 37.0    Collection site VENOUS    Drawn by [id number]    Sample type VENOUS   D-dimer, quantitative     Status: None   Collection Time: 03/04/21  3:03 AM  Result Value Ref Range  D-Dimer, Quant 0.47 0.00 - 0.50  ug/mL-FEU  C-reactive protein     Status: None   Collection Time: 03/04/21  3:03 AM  Result Value Ref Range   CRP 0.6 <1.0 mg/dL  Comprehensive metabolic panel     Status: Abnormal   Collection Time: 03/04/21  3:03 AM  Result Value Ref Range   Sodium 135 135 - 145 mmol/L   Potassium 5.0 3.5 - 5.1 mmol/L   Chloride 94 (L) 98 - 111 mmol/L   CO2 36 (H) 22 - 32 mmol/L   Glucose, Bld 126 (H) 70 - 99 mg/dL   BUN 5 (L) 6 - 20 mg/dL   Creatinine, Ser 0.59 0.44 - 1.00 mg/dL   Calcium 7.9 (L) 8.9 - 10.3 mg/dL   Total Protein 6.4 (L) 6.5 - 8.1 g/dL   Albumin 3.4 (L) 3.5 - 5.0 g/dL   AST 33 15 - 41 U/L   ALT 33 0 - 44 U/L   Alkaline Phosphatase 75 38 - 126 U/L   Total Bilirubin 1.0 0.3 - 1.2 mg/dL   GFR, Estimated >60 >60 mL/min   Anion gap 5 5 - 15  CBC with Differential/Platelet     Status: Abnormal   Collection Time: 03/04/21  3:03 AM  Result Value Ref Range   WBC 6.7 4.0 - 10.5 K/uL   RBC 5.63 (H) 3.87 - 5.11 MIL/uL   Hemoglobin 18.0 (H) 12.0 - 15.0 g/dL   HCT 58.1 (H) 36.0 - 46.0 %   MCV 103.2 (H) 80.0 - 100.0 fL   MCH 32.0 26.0 - 34.0 pg   MCHC 31.0 30.0 - 36.0 g/dL   RDW 14.9 11.5 - 15.5 %   Platelets 132 (L) 150 - 400 K/uL   nRBC 0.0 0.0 - 0.2 %   Neutrophils Relative % 67 %   Neutro Abs 4.5 1.7 - 7.7 K/uL   Lymphocytes Relative 23 %   Lymphs Abs 1.5 0.7 - 4.0 K/uL   Monocytes Relative 9 %   Monocytes Absolute 0.6 0.1 - 1.0 K/uL   Eosinophils Relative 0 %   Eosinophils Absolute 0.0 0.0 - 0.5 K/uL   Basophils Relative 0 %   Basophils Absolute 0.0 0.0 - 0.1 K/uL   Immature Granulocytes 1 %   Abs Immature Granulocytes 0.05 0.00 - 0.07 K/uL  Brain natriuretic peptide     Status: None   Collection Time: 03/04/21  3:03 AM  Result Value Ref Range   B Natriuretic Peptide 81.8 0.0 - 100.0 pg/mL    Imaging/Diagnostic Tests: CT Angio Chest PE W and/or Wo Contrast  Result Date: 03/03/2021 CLINICAL DATA:  Chest pain, short of breath EXAM: CT ANGIOGRAPHY CHEST WITH CONTRAST  TECHNIQUE: Multidetector CT imaging of the chest was performed using the standard protocol during bolus administration of intravenous contrast. Multiplanar CT image reconstructions and MIPs were obtained to evaluate the vascular anatomy. CONTRAST:  77mL OMNIPAQUE IOHEXOL 350 MG/ML SOLN COMPARISON:  03/03/2021 FINDINGS: Cardiovascular: This is a technically adequate evaluation of the pulmonary vasculature. No filling defects or pulmonary emboli. Mild cardiomegaly without pericardial effusion. Normal caliber of the thoracic aorta with no evidence of aneurysm or dissection. Mild atherosclerosis. Mediastinum/Nodes: No enlarged mediastinal, hilar, or axillary lymph nodes. Thyroid gland, trachea, and esophagus demonstrate no significant findings. Lungs/Pleura: Upper lobe predominant emphysema. Minimal atelectasis within the left lower lobe. No acute airspace disease, effusion, or pneumothorax. Upper Abdomen: No acute abnormality. Musculoskeletal: No acute or destructive bony lesions. Reconstructed images demonstrate no additional findings. Review  of the MIP images confirms the above findings. IMPRESSION: 1. No evidence of pulmonary embolus. 2. Mild cardiomegaly. 3. Aortic Atherosclerosis (ICD10-I70.0) and Emphysema (ICD10-J43.9). Electronically Signed   By: Randa Ngo M.D.   On: 03/03/2021 16:13     Merrily Brittle, DO 03/04/2021, 3:10 PM PGY-1, [place] Intern pager: [phone], text pages welcome

## 2021-03-05 DIAGNOSIS — R0689 Other abnormalities of breathing: Secondary | ICD-10-CM

## 2021-03-05 DIAGNOSIS — G929 Unspecified toxic encephalopathy: Secondary | ICD-10-CM

## 2021-03-05 LAB — CBC WITH DIFFERENTIAL/PLATELET
Abs Immature Granulocytes: 0.03 10*3/uL (ref 0.00–0.07)
Basophils Absolute: 0 10*3/uL (ref 0.0–0.1)
Basophils Relative: 0 %
Eosinophils Absolute: 0 10*3/uL (ref 0.0–0.5)
Eosinophils Relative: 0 %
HCT: 62.1 % — ABNORMAL HIGH (ref 36.0–46.0)
Hemoglobin: 18.8 g/dL — ABNORMAL HIGH (ref 12.0–15.0)
Immature Granulocytes: 0 %
Lymphocytes Relative: 24 %
Lymphs Abs: 2 10*3/uL (ref 0.7–4.0)
MCH: 31.3 pg (ref 26.0–34.0)
MCHC: 30.3 g/dL (ref 30.0–36.0)
MCV: 103.5 fL — ABNORMAL HIGH (ref 80.0–100.0)
Monocytes Absolute: 0.9 10*3/uL (ref 0.1–1.0)
Monocytes Relative: 11 %
Neutro Abs: 5.4 10*3/uL (ref 1.7–7.7)
Neutrophils Relative %: 65 %
Platelets: 187 10*3/uL (ref 150–400)
RBC: 6 MIL/uL — ABNORMAL HIGH (ref 3.87–5.11)
RDW: 14.7 % (ref 11.5–15.5)
WBC: 8.4 10*3/uL (ref 4.0–10.5)
nRBC: 0 % (ref 0.0–0.2)

## 2021-03-05 LAB — BLOOD GAS, VENOUS
Acid-Base Excess: 13.9 mmol/L — ABNORMAL HIGH (ref 0.0–2.0)
Acid-Base Excess: 14.2 mmol/L — ABNORMAL HIGH (ref 0.0–2.0)
Bicarbonate: 40.9 mmol/L — ABNORMAL HIGH (ref 20.0–28.0)
Bicarbonate: 41.9 mmol/L — ABNORMAL HIGH (ref 20.0–28.0)
Drawn by: 164
FIO2: 36
O2 Saturation: 90.6 %
O2 Saturation: 95.6 %
Patient temperature: 37
Patient temperature: 37
pCO2, Ven: 87.7 mmHg (ref 44.0–60.0)
pCO2, Ven: 102 mmHg (ref 44.0–60.0)
pH, Ven: 7.29 (ref 7.250–7.430)
pH, Ven: 7.237 — ABNORMAL LOW (ref 7.250–7.430)
pO2, Ven: 65.8 mmHg — ABNORMAL HIGH (ref 32.0–45.0)
pO2, Ven: 91.5 mmHg — ABNORMAL HIGH (ref 32.0–45.0)

## 2021-03-05 LAB — GLUCOSE, CAPILLARY
Glucose-Capillary: 138 mg/dL — ABNORMAL HIGH (ref 70–99)
Glucose-Capillary: 221 mg/dL — ABNORMAL HIGH (ref 70–99)
Glucose-Capillary: 208 mg/dL — ABNORMAL HIGH (ref 70–99)

## 2021-03-05 LAB — COMPREHENSIVE METABOLIC PANEL
ALT: 34 U/L (ref 0–44)
AST: 22 U/L (ref 15–41)
Albumin: 3.7 g/dL (ref 3.5–5.0)
Alkaline Phosphatase: 75 U/L (ref 38–126)
Anion gap: 7 (ref 5–15)
BUN: 8 mg/dL (ref 6–20)
CO2: 39 mmol/L — ABNORMAL HIGH (ref 22–32)
Calcium: 8.7 mg/dL — ABNORMAL LOW (ref 8.9–10.3)
Chloride: 92 mmol/L — ABNORMAL LOW (ref 98–111)
Creatinine, Ser: 0.67 mg/dL (ref 0.44–1.00)
GFR, Estimated: >60 mL/min (ref >60)
Glucose, Bld: 105 mg/dL — ABNORMAL HIGH (ref 70–99)
Potassium: 4.5 mmol/L (ref 3.5–5.1)
Sodium: 138 mmol/L (ref 135–145)
Total Bilirubin: 0.5 mg/dL (ref 0.3–1.2)
Total Protein: 7 g/dL (ref 6.5–8.1)

## 2021-03-05 LAB — BLOOD GAS, ARTERIAL
Acid-Base Excess: 15.7 mmol/L — ABNORMAL HIGH (ref 0.0–2.0)
Bicarbonate: 43 mmol/L — ABNORMAL HIGH (ref 20.0–28.0)
Drawn by: 28338
FIO2: 60
O2 Saturation: 97.9 %
Patient temperature: 37
pCO2 arterial: 96.1 mmHg (ref 32.0–48.0)
pH, Arterial: 7.273 — ABNORMAL LOW (ref 7.350–7.450)
pO2, Arterial: 122 mmHg — ABNORMAL HIGH (ref 83.0–108.0)

## 2021-03-05 LAB — D-DIMER, QUANTITATIVE: D-Dimer, Quant: 0.32 ug/mL-FEU (ref 0.00–0.50)

## 2021-03-05 LAB — C-REACTIVE PROTEIN: CRP: 0.6 mg/dL (ref <1.0)

## 2021-03-05 MED ORDER — HYDROXYZINE HCL 10 MG PO TABS
10.0000 mg | ORAL_TABLET | Freq: Three times a day (TID) | ORAL | Status: DC | PRN
  Administered 2021-03-05 – 2021-03-07 (×4): 10 mg via ORAL
  Filled 2021-03-05 (×4): qty 1

## 2021-03-05 MED ORDER — IPRATROPIUM-ALBUTEROL 0.5-2.5 (3) MG/3ML IN SOLN
3.0000 mL | Freq: Four times a day (QID) | RESPIRATORY_TRACT | Status: DC
  Administered 2021-03-05 (×2): 3 mL via RESPIRATORY_TRACT
  Filled 2021-03-05 (×2): qty 3

## 2021-03-05 MED ORDER — INSULIN ASPART 100 UNIT/ML IJ SOLN
0.0000 [IU] | Freq: Three times a day (TID) | INTRAMUSCULAR | Status: DC
  Administered 2021-03-05: 1 [IU] via SUBCUTANEOUS
  Administered 2021-03-05: 3 [IU] via SUBCUTANEOUS
  Administered 2021-03-06: 16:00:00 1 [IU] via SUBCUTANEOUS
  Administered 2021-03-07: 17:00:00 5 [IU] via SUBCUTANEOUS
  Administered 2021-03-07: 13:00:00 1 [IU] via SUBCUTANEOUS
  Administered 2021-03-08 (×2): 3 [IU] via SUBCUTANEOUS

## 2021-03-05 MED ORDER — IPRATROPIUM-ALBUTEROL 0.5-2.5 (3) MG/3ML IN SOLN: 3.0000 mL | Freq: Four times a day (QID) | RESPIRATORY_TRACT | Status: DC

## 2021-03-05 NOTE — Progress Notes (Signed)
Late entry, patient was seen around 2pm.  Went to check in on patient due to elevated CIWA score of 7. Patient reports that she used to drink regularly but does not do that anymore and it has been several days and she does not drink regularly and has not had any binge drinking episodes. Discussed with her the concern about alcohol withdrawal and patient was understanding and voiced that it should not be a concern. We will not add ativan at this time.  Patient also expressed significant emotional distress as she felt like she was going to die last night because of her breathing and that she is very scared. She is concerned about her son and family as well. She does not want to die and is very tearful in the room. Discussed with the patient our plan for treatment and that we will stay on the BiPap and continue to closely monitor. Patient seemed somewhat reassured. Patient will be rounded on during the night as well.   Michele Menning, DO

## 2021-03-05 NOTE — Consult Note (Signed)
NAME:  Michele [name], MRN:  [id number], DOB:  [birth year], LOS: 1 ADMISSION DATE:  03/03/2021, CONSULTATION DATE:  03/05/21 REFERRING MD:  Rise Patience, DO CHIEF COMPLAINT:  Respiratory failure, COVID-19   History of Present Illness:  50 year old female active smoker with no known PMHx admitted for acute on hypercarbic and hypoxemic respiratory secondary to COVID-19. Admitted by FPTS. Treated with remdesivir, decadron and BiPAP. Oxygen saturation on RA >94%. ABG with VBG 7/23/102/91.  She smokes 1/2-1ppd x 25 years. Has never seen a primary care physician. She currently feels anxious and that she is worried about how sick she is. She is starting to have a productive cough with green sputum. Associated with wheezing.  She has minimized her movement out of fear.   Pertinent  Medical History  No PCP  Significant Hospital Events: Including procedures, antibiotic start and stop dates in addition to other pertinent events   12/17 PCCM consulted  Interim History / Subjective:  As above  Objective   Blood pressure 138/89, pulse 62, temperature 97.9 F (36.6 C), temperature source Oral, resp. rate 20, height 5\' 2"  (1.575 m), weight 99 kg, last menstrual period 03/20/2009, SpO2 94 %.        Intake/Output Summary (Last 24 hours) at 03/05/2021 1019 Last data filed at 03/04/2021 1700 Gross per 24 hour  Intake 580 ml  Output --  Net 580 ml   Filed Weights   03/04/21 2132  Weight: 99 kg   Physical Exam: General: Chronically ill-appearing, no acute distress HENT: [place], AT, OP clear, MMM Eyes: EOMI, no scleral icterus Respiratory: Diminished breath sounds bilaterally.  Wheezing present Cardiovascular: RRR, -M/R/G, no JVD GI: BS+, soft, nontender Extremities:-Edema,-tenderness Neuro: AAO x4, CNII-XII grossly intact Psych: Agitated mood, normal affect GU: Foley in place  TTE 50-55%. LV with no valvular or WMA. RAP 69mm Hg CTA 03/03/21 - Upper lobe predominant emphysema. No  infiltrate, effusion or edema. No PE.  Resolved Hospital Problem list     Assessment & Plan:  Acute on chronic hypercarbic and hypoxemic respiratory failure in setting of COVID infection Suspected undiagnosed OSA/OHS Polycythemia Normal echo. No s/sx of PH. --BiPAP nightly --Will likely need to be discharged on BiPAP nightly. Primary team to arrange NIV with SW. --Will need outpatient sleep study  Patient with chronic respiratory failure with hypercarbia.  Previous ABG's have documented high PCO2.  Patient would benefit from non-invasive ventilation.  Without this therapy, the patient is at high risk of ending up with worsening symptoms, worsened respiratory failure, need for ER visits and/or recurrent hospitalizations.  Bilevel device unable to adequately support patient's nocturnal ventilation needs.  Patient would benefit from NIV therapy with set tidal volumes and pressure.  COVID-19 pneumonia --Supplemental O2 for goal SpO2 >88% --Complete remdesivir x 5d --Complete dexamethasone x 10d  Upper lobe predominant emphysema Active smoker --Nicotine patch --Smoking cessation --START duonebs Q6h. Transition to Incruse prior to discharge   Best Practice (right click and "Reselect all SmartList Selections" daily)   Diet/type: Regular consistency (see orders) DVT prophylaxis: LMWH GI prophylaxis: N/A Lines: N/A Foley:  N/A Code Status:  full code Last date of multidisciplinary goals of care discussion [Per primary team]  Labs   CBC: Recent Labs  Lab 03/03/21 1400 03/03/21 1910 03/04/21 0303 03/05/21 0044  WBC 6.8  --  6.7 8.4  NEUTROABS 4.1  --  4.5 5.4  HGB 18.5* 20.1* 18.0* 18.8*  HCT 58.8* 59.0* 58.1* 62.1*  MCV 102.8*  --  103.2*  103.5*  PLT 171  --  132* 034    Basic Metabolic Panel: Recent Labs  Lab 03/03/21 1400 03/03/21 1910 03/04/21 0303 03/05/21 0044  NA 138 137 135 138  K 3.9 4.2 5.0 4.5  CL 95*  --  94* 92*  CO2 37*  --  36* 39*  GLUCOSE 116*  --   126* 105*  BUN <5*  --  5* 8  CREATININE 0.75  --  0.59 0.67  CALCIUM 8.2*  --  7.9* 8.7*   GFR: Estimated Creatinine Clearance: 93.6 mL/min (by C-G formula based on SCr of 0.67 mg/dL). Recent Labs  Lab 03/03/21 1400 03/04/21 0303 03/05/21 0044  WBC 6.8 6.7 8.4    Liver Function Tests: Recent Labs  Lab 03/03/21 1400 03/04/21 0303 03/05/21 0044  AST 30 33 22  ALT 34 33 34  ALKPHOS 69 75 75  BILITOT 0.4 1.0 0.5  PROT 6.5 6.4* 7.0  ALBUMIN 3.4* 3.4* 3.7   No results for input(s): LIPASE, AMYLASE in the last 168 hours. No results for input(s): AMMONIA in the last 168 hours.  ABG    Component Value Date/Time   HCO3 41.9 (H) 03/05/2021 0805   TCO2 42 (H) 03/03/2021 1910   O2SAT 95.6 03/05/2021 0805     Coagulation Profile: No results for input(s): INR, PROTIME in the last 168 hours.  Cardiac Enzymes: No results for input(s): CKTOTAL, CKMB, CKMBINDEX, TROPONINI in the last 168 hours.  HbA1C: Hgb A1c MFr Bld  Date/Time Value Ref Range Status  03/03/2021 07:00 PM 6.5 (H) 4.8 - 5.6 % Final    Comment:    (NOTE) Pre diabetes:          5.7%-6.4%  Diabetes:              >6.4%  Glycemic control for   <7.0% adults with diabetes     CBG: Recent Labs  Lab 03/04/21 1718 03/04/21 2130  GLUCAP 127* 192*    Review of Systems:   Review of Systems  Constitutional:  Negative for chills, diaphoresis, fever, malaise/fatigue and weight loss.  HENT:  Negative for congestion, ear pain and sore throat.   Respiratory:  Positive for cough, sputum production, shortness of breath and wheezing. Negative for hemoptysis.   Cardiovascular:  Negative for chest pain, palpitations and leg swelling.  Gastrointestinal:  Negative for abdominal pain, heartburn and nausea.  Genitourinary:  Negative for frequency.  Musculoskeletal:  Positive for back pain. Negative for joint pain and myalgias.  Skin:  Negative for itching and rash.  Neurological:  Negative for dizziness, weakness and  headaches.  Endo/Heme/Allergies:  Does not bruise/bleed easily.  Psychiatric/Behavioral:  Negative for depression. The patient is nervous/anxious.    Past Medical History:  She,  has a past medical history of Medical history non-contributory.   Surgical History:   Past Surgical History:  Procedure Laterality Date   BILATERAL SALPINGECTOMY  04/07/2015   Procedure: BILATERAL SALPINGECTOMY with left oophorectomy, left ovarian cystectomy and removal of pelvic mass;  Surgeon: Frederico Hamman, MD;  Location: [facility];  Service: Gynecology;;   CESAREAN SECTION     WISDOM TOOTH EXTRACTION       Social History:   reports that she has been smoking cigarettes. She has been smoking an average of .5 packs per day. She has never used smokeless tobacco. She reports current alcohol use. She reports that she does not use drugs.   Family History:  Her family history includes Cancer in her maternal  aunt, maternal uncle, and mother.   Allergies No Known Allergies   Home Medications  Prior to Admission medications   Medication Sig Start Date End Date Taking? Authorizing Provider  acetaminophen (TYLENOL) 500 MG tablet Take 500 mg by mouth every 6 (six) hours as needed for mild pain.   Yes [provider]     Critical care time:     Rodman Pickle, M.D. [facility] Pulmonary/Critical Care Medicine 03/05/2021 1:06 PM   See Amion for personal pager For hours between 7 PM to 7 AM, please call [facility] for urgent questions

## 2021-03-05 NOTE — Progress Notes (Addendum)
FPTS Brief Progress Note  S: Pt is confused and has a HA. She continues to report that she is "confused and anxious."    O: BP (!) 144/88 (BP Location: Left Arm)    Pulse 80    Temp 97.7 F (36.5 C) (Axillary)    Resp 18    Ht 5\' 2"  (1.575 m)    Wt 99 kg    LMP 03/20/2009 (Within Years)    SpO2 91%    BMI 39.92 kg/m   General: Anxious, groggy, tearful  Respiratory: nasal cannula in place, wheezing appreciated inspiratory and expiratory Skin: warm and dry Psych: Anxious mood    A/P: Michele [name] is a 50 year old female presenting with acute hypoxemic hypercarbic respiratory failure secondary to COVID.  Initially this morning she seemed to have been improving.  However, when I went to evaluate the patient, she was fairly confused and alert and oriented x2.  I discussed with nursing the need for VBG, labs ordered.  I also talked to respiratory, and they had agreed to bring a CPAP to the room for the patient.  I will closely monitor her throughout the rest of the night and we will follow-up on VBG. - Orders reviewed. Labs for AM ordered, which was adjusted as needed.   Addendum: VBG pCO2 82.5>87.7. CPAP on patient and she is tolerating it well. We will order an updated VBG in morning. Will go check on patient in a couple of hours.    Erskine Emery, MD 03/05/2021, 1:48 AM PGY-1, [facility] Family Medicine Night Resident  Please page [phone] with questions.

## 2021-03-05 NOTE — Care Management (Addendum)
Benefit check sumbitted for incruse, will result Monday. Order for BiPAP on chart, to be signed, MD aware  BiPAP order signed, and scanned to [facility] with [employer] who will process order. Date of DC is yet to be determined per MD

## 2021-03-05 NOTE — Care Management (Signed)
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 The use of the NIV will treat patients high PC02 levels (96.1 with elevated Bicarbonate of 43 on 03/05/21) and can reduce risk of exacerbations and future hospitalizations when used at night and during the day.  All alternate devices [phone] and [id number]) have been proven ineffective to provide essential volume control necessary to maintain acceptable CO2 levels.  An NIV with IVAPS is necessary to prevent patient from life-threatening harm.  Interruption or failure to provide NIV would quickly lead to exacerbation of the patients condition, hospital admission, and likely harm to the patient.   Continued use is preferred.  Patient is able to protect their airways and clear secretions on their own.

## 2021-03-05 NOTE — Progress Notes (Addendum)
Date and time results received: 03/05/21 0213  Test: VGB- PCo2 Critical Value: 87.7  Name of Provider Notified: Erskine Emery  Orders Received? Or Actions Taken?:  MD notified, Pt placed on CpAp, continuous pulse-ox continued, awaiting additional orders, will monitor closely    Results reported by: Assunta Gambles, Lab Reported to: Helane Rima, RN

## 2021-03-05 NOTE — Progress Notes (Signed)
Family Medicine Teaching Service Daily Progress Note Intern Pager: [phone]  Patient name: Michele [name] Medical record number: [id number] Date of birth: 1970-05-06 Age: 50 y.o. Gender: female  Primary Care Provider: Patient, No Pcp Per (Inactive) Consultants: None Code Status: Full  Pt Overview and Major Events to Date:  12/15: Admitted to FPTS   Assessment and Plan:  Michele [name] is a 50 year old female presenting with acute respiratory failure secondary to [place].  Patient does not regularly have a PCP, so no documented past medical history.  Acute hypoxemic hypercarbic respiratory failure 2/2 COVID Day #3 Likely longstanding hypercarbic state with polycythemia and VBG results. Unable to obtain ABG yesterday, echocardiogram showed:  LVEF 50-55% with mild left ventricular hypertrophy. Inflammatory markers have downtrended. Pt had an episode of confusion last night, and VBG showed pCO2 82>87.7, pH 7.290 and was placed on the CPAP overnight. On re-evaluation, she continued to have confusion and agitation that kept her from keeping her oxygen saturations up. We transitioned her to BiPaP to maintain goal saturations. Will keep her on BiPaP while sleeping and will obtain updated VBG.   --spO2 goal 88-92& --Inflammatory labs trended  -- Daily CBC and CMP -- Remdes Day 3/5 -- Decadron Day 3/10 -- IS and flutter valve  -- AM VBG  -- Airborne and contact precautions   ?COPD, improving  Patient likely has chronic hypercarbic state and has been a long-term smoker.  Likely that the patient has undiagnosed COPD. -Continue Incruse Ellipta daily  Possible OSA Given the chronic polycythemia and increase in hypercarbic state especially while sleeping, it is very likely that she has obstructive sleep apnea.  We have involved respiratory and she will be wearing a CPAP at night during the rest of this admission. - TOC on for PCP - Sleep study outpatient --CPAP/BiPaP must be used at night    Diabetes  Patient received an A1c on this admission that was 6.5.  This does borderline meet diabetic criteria.  She needs to continue to follow-up with this with the primary care physician.  CBG 127 > 192. Goal 140-180.  -- Regular CBG monitoring  Stable and chronic: Tobacco use: Nicotine patch Aortic atherosclerosis identified on CTA   FEN/GI: Regular  PPx: Lovenox Dispo: 2-3 days  pending clinical improvement . Barriers include oxygen requirement.   Subjective:  Patient was very confused this morning.  Upon awakening, she reported that I was a liar and she did not like me.  She also continued to endorse that she was confused with a headache.  Objective: Temp:  [97.7 F (36.5 C)-98.2 F (36.8 C)] 97.7 F (36.5 C) (12/16 2132) Pulse Rate:  [69-88] 69 (12/17 0534) Resp:  [18-20] 20 (12/17 0534) BP: (135-144)/(83-88) 144/88 (12/16 2132) SpO2:  [90 %-98 %] 98 % (12/17 0534) Weight:  [99 kg] 99 kg (12/16 2132) Physical Exam: General: Acutely distressed, tearful, agitated, BiPaP in place  Cardiovascular: RRR Respiratory: Inspiratory and expiratory wheezing Abdomen: Nontender  Extremities: FROM   Laboratory: Recent Labs  Lab 03/03/21 1400 03/03/21 1910 03/04/21 0303 03/05/21 0044  WBC 6.8  --  6.7 8.4  HGB 18.5* 20.1* 18.0* 18.8*  HCT 58.8* 59.0* 58.1* 62.1*  PLT 171  --  132* 187   Recent Labs  Lab 03/03/21 1400 03/03/21 1910 03/04/21 0303 03/05/21 0044  NA 138 137 135 138  K 3.9 4.2 5.0 4.5  CL 95*  --  94* 92*  CO2 37*  --  36* 39*  BUN <5*  --  5* 8  CREATININE 0.75  --  0.59 0.67  CALCIUM 8.2*  --  7.9* 8.7*  PROT 6.5  --  6.4* 7.0  BILITOT 0.4  --  1.0 0.5  ALKPHOS 69  --  75 75  ALT 34  --  33 34  AST 30  --  33 22  GLUCOSE 116*  --  126* 105*     Latest Reference Range & Units Most Recent  Sample type  VENOUS 03/05/21 01:49  pH, Ven 7.250 - 7.430  7.290 03/05/21 01:49  pCO2, Ven 44.0 - 60.0 mmHg 87.7 (HH) 03/05/21 01:49  pO2, Ven 32.0 -  45.0 mmHg 65.8 (H) 03/05/21 01:49  TCO2 22 - 32 mmol/L 42 (H) 03/03/21 19:10  Acid-Base Excess 0.0 - 2.0 mmol/L 13.9 (H) 03/05/21 01:49  Bicarbonate 20.0 - 28.0 mmol/L 40.9 (H) 03/05/21 01:49  O2 Saturation % 90.6 03/05/21 01:49  Patient temperature  37.0 03/05/21 01:49  Collection site  VENOUS 03/05/21 01:49  ([place]): Data is critically high (H): Data is abnormally high   Erskine Emery, MD 03/05/2021, 6:09 AM PGY-1, [place] Intern pager: [phone], text pages welcome

## 2021-03-05 NOTE — Progress Notes (Signed)
Received message from nursing that the patient has a critical lab with PCO2 of 102. Patient is currently on BiPap and we have consulted pulmonology, who will see the patient today. If there are any acute changes and patient needs to be seen immediately, we are instructed to call them back sooner to let them know.   Madline Oesterling, DO  03/05/2021 9:24 AM

## 2021-03-06 LAB — BLOOD GAS, VENOUS
Acid-Base Excess: 16.9 mmol/L — ABNORMAL HIGH (ref 0.0–2.0)
Bicarbonate: 43.4 mmol/L — ABNORMAL HIGH (ref 20.0–28.0)
Drawn by: 164
FIO2: 97
O2 Saturation: 76.6 %
Patient temperature: 37
pCO2, Ven: 82.4 mmHg (ref 44.0–60.0)
pH, Ven: 7.341 (ref 7.250–7.430)
pO2, Ven: 44.6 mmHg (ref 32.0–45.0)

## 2021-03-06 LAB — CBC WITH DIFFERENTIAL/PLATELET
Abs Immature Granulocytes: 0.03 10*3/uL (ref 0.00–0.07)
Basophils Absolute: 0 10*3/uL (ref 0.0–0.1)
Basophils Relative: 0 %
Eosinophils Absolute: 0 10*3/uL (ref 0.0–0.5)
Eosinophils Relative: 0 %
HCT: 58 % — ABNORMAL HIGH (ref 36.0–46.0)
Hemoglobin: 17.6 g/dL — ABNORMAL HIGH (ref 12.0–15.0)
Immature Granulocytes: 0 %
Lymphocytes Relative: 23 %
Lymphs Abs: 2.3 10*3/uL (ref 0.7–4.0)
MCH: 31.4 pg (ref 26.0–34.0)
MCHC: 30.3 g/dL (ref 30.0–36.0)
MCV: 103.6 fL — ABNORMAL HIGH (ref 80.0–100.0)
Monocytes Absolute: 1 10*3/uL (ref 0.1–1.0)
Monocytes Relative: 10 %
Neutro Abs: 6.6 10*3/uL (ref 1.7–7.7)
Neutrophils Relative %: 67 %
Platelets: 161 10*3/uL (ref 150–400)
RBC: 5.6 MIL/uL — ABNORMAL HIGH (ref 3.87–5.11)
RDW: 14.5 % (ref 11.5–15.5)
WBC: 9.9 10*3/uL (ref 4.0–10.5)
nRBC: 0 % (ref 0.0–0.2)

## 2021-03-06 LAB — COMPREHENSIVE METABOLIC PANEL
ALT: 30 U/L (ref 0–44)
AST: 20 U/L (ref 15–41)
Albumin: 3.4 g/dL — ABNORMAL LOW (ref 3.5–5.0)
Alkaline Phosphatase: 69 U/L (ref 38–126)
Anion gap: 7 (ref 5–15)
BUN: 11 mg/dL (ref 6–20)
CO2: 38 mmol/L — ABNORMAL HIGH (ref 22–32)
Calcium: 8.5 mg/dL — ABNORMAL LOW (ref 8.9–10.3)
Chloride: 88 mmol/L — ABNORMAL LOW (ref 98–111)
Creatinine, Ser: 0.53 mg/dL (ref 0.44–1.00)
GFR, Estimated: >60 mL/min (ref >60)
Glucose, Bld: 91 mg/dL (ref 70–99)
Potassium: 4.3 mmol/L (ref 3.5–5.1)
Sodium: 133 mmol/L — ABNORMAL LOW (ref 135–145)
Total Bilirubin: 0.7 mg/dL (ref 0.3–1.2)
Total Protein: 6.6 g/dL (ref 6.5–8.1)

## 2021-03-06 LAB — C-REACTIVE PROTEIN: CRP: 0.7 mg/dL (ref <1.0)

## 2021-03-06 LAB — GLUCOSE, CAPILLARY
Glucose-Capillary: 87 mg/dL (ref 70–99)
Glucose-Capillary: 95 mg/dL (ref 70–99)
Glucose-Capillary: 151 mg/dL — ABNORMAL HIGH (ref 70–99)
Glucose-Capillary: 171 mg/dL — ABNORMAL HIGH (ref 70–99)

## 2021-03-06 LAB — D-DIMER, QUANTITATIVE: D-Dimer, Quant: 0.27 ug/mL-FEU (ref 0.00–0.50)

## 2021-03-06 MED ORDER — GUAIFENESIN ER 600 MG PO TB12
600.0000 mg | ORAL_TABLET | Freq: Two times a day (BID) | ORAL | Status: DC
  Administered 2021-03-06 – 2021-03-09 (×7): 600 mg via ORAL
  Filled 2021-03-06 (×7): qty 1

## 2021-03-06 MED ORDER — IPRATROPIUM-ALBUTEROL 0.5-2.5 (3) MG/3ML IN SOLN
3.0000 mL | Freq: Three times a day (TID) | RESPIRATORY_TRACT | Status: DC
Start: 2021-03-06 — End: 2021-03-09
  Administered 2021-03-06 – 2021-03-08 (×8): 3 mL via RESPIRATORY_TRACT
  Filled 2021-03-06 (×8): qty 3

## 2021-03-06 NOTE — Progress Notes (Signed)
FPTS Brief Progress Note  S: Sleeping.    O: BP (!) 149/97    Pulse 68    Temp 97.9 F (36.6 C) (Oral)    Resp 19    Ht 5\' 2"  (1.575 m)    Wt 99 kg    LMP 03/20/2009 (Within Years)    SpO2 93%    BMI 39.92 kg/m   General: Sleeping, no acute distress. Age appropriate. Respiratory: BiPAP in place.    A/P: Acute Hypoxemic Hypercarbic Respiratory Failure COVID VSS. Continue to monitor respiratory status.  - Orders reviewed. Labs for AM ordered, which was adjusted as needed.   Gerlene Fee, DO 03/06/2021, 11:10 PM PGY-3, [facility] Night Resident  Please page [phone] with questions.

## 2021-03-06 NOTE — Evaluation (Signed)
Occupational Therapy Evaluation Patient Details Name: Michele [name] MRN: [id number] DOB: [birth year] Today's Date: 03/06/2021   History of Present Illness Michele [name] is a 50 year old female presenting with acute respiratory failure secondary to [place].  Patient does not regularly have a PCP, so no documented past medical history. Pt received A1c on this admission that was 6.5.   Clinical Impression   PTA, pt was living at home alone, pt reports she has good assistance from family and friends at d/c. Pt reports she was independent with ADL/IADL and functional mobility without AD. Pt agreeable to OT session, she appeared anxious but was easily redirected. Pt noted to have dysarthric like speech with waxing/waning segmented sentences. Pt on 6lnc upon arrival and SpO2 at rest was 90%, pt requesting to ambulate to the bathroom, she required 10L via [place] to maintain SpO2 >88%. Pt educated on activity tolerance and progression, energy conservation strategies, calming strategies and pursed lip breathing. She currently requires minguard assistance for toilet transfer, grooming, and functional mobility. Due to decline in current level of function, pt would benefit from acute OT to address established goals to facilitate safe D/C to venue listed below. At this time, recommend HHOT follow-up with 24/7 supervision. Will continue to follow acutely.       Recommendations for follow up therapy are one component of a multi-disciplinary discharge planning process, led by the attending physician.  Recommendations may be updated based on patient status, additional functional criteria and insurance authorization.   Follow Up Recommendations  Home health OT    Assistance Recommended at Discharge Frequent or constant Supervision/Assistance  Functional Status Assessment  Patient has had a recent decline in their functional status and demonstrates the ability to make significant improvements in function in a  reasonable and predictable amount of time.  Equipment Recommendations  BSC/3in1    Recommendations for Other Services       Precautions / Restrictions Precautions Precautions: Fall Precaution Comments: watch O2      Mobility Bed Mobility Overal bed mobility: Modified Independent             General bed mobility comments: hob elevated    Transfers Overall transfer level: Needs assistance Equipment used: None Transfers: Sit to/from Stand;Bed to chair/wheelchair/BSC Sit to Stand: Min guard Stand pivot transfers: Min guard         General transfer comment: minguard for safety and stability as well as line management      Balance Overall balance assessment: Mild deficits observed, not formally tested                                         ADL either performed or assessed with clinical judgement   ADL Overall ADL's : Needs assistance/impaired Eating/Feeding: Independent   Grooming: Min guard;Standing   Upper Body Bathing: Set up;Sitting   Lower Body Bathing: Min guard;Sit to/from stand   Upper Body Dressing : Set up;Sitting   Lower Body Dressing: Minimal assistance;Sit to/from stand Lower Body Dressing Details (indicate cue type and reason): assist with access to feet, pt may benefit from AE Toilet Transfer: Min guard;Ambulation Toilet Transfer Details (indicate cue type and reason): pt ambulated to commode and voided Toileting- Clothing Manipulation and Hygiene: Min guard;Sit to/from stand       Functional mobility during ADLs: Min guard General ADL Comments: minguard for stability, pt noted to be reaching for surfaces upon return  from the bathroom, feel she would feel more steady with a RW. Pt limited by decreased activity tolerance, cardiopulmonary limitations and cognition.     Vision         Perception     Praxis      Pertinent Vitals/Pain Pain Assessment: No/denies pain     Hand Dominance Right   Extremity/Trunk  Assessment Upper Extremity Assessment Upper Extremity Assessment: Overall WFL for tasks assessed   Lower Extremity Assessment Lower Extremity Assessment: Overall WFL for tasks assessed   Cervical / Trunk Assessment Cervical / Trunk Assessment: Normal   Communication Communication Communication: Expressive difficulties (dysarthritic speech)   Cognition Arousal/Alertness: Awake/alert Behavior During Therapy: Anxious Overall Cognitive Status: No family/caregiver present to determine baseline cognitive functioning Area of Impairment: Following commands;Safety/judgement;Awareness;Problem solving                       Following Commands: Follows one step commands consistently;Follows multi-step commands with increased time Safety/Judgement: Decreased awareness of safety Awareness: Emergent Problem Solving: Slow processing;Requires verbal cues;Requires tactile cues General Comments: Pt appears to be very anxious regarding her current medical status. She verbalized her dissappointment and frustration with herself regarding smoking. Pt wtih waxing and waning segmented speech. did not formally assess as pt appeared to have increased frustration with certain questions. will continue to assess further.     General Comments  Pt on 6lnc upon arrival, SpO2 89-90%;pt requesting to ambulate to the bathroom. Pt required 10L via [place] to maintain SpO2 >88%.    Exercises     Shoulder Instructions      Home Living Family/patient expects to be discharged to:: Private residence Living Arrangements: Alone Available Help at Discharge: Friend(s);Available PRN/intermittently Type of Home: House Home Access: Stairs to enter [employer] of Steps: 3 Entrance Stairs-Rails: None Home Layout: One level     Bathroom Shower/Tub: Teacher, early years/pre: Standard Bathroom Accessibility: No       Additional Comments: pt has 2 boys that live close by      Prior  Functioning/Environment Prior Level of Function : Independent/Modified Independent             Mobility Comments: ambulating without AD ADLs Comments: friends will drive pt.        OT Problem List: Decreased activity tolerance;Impaired balance (sitting and/or standing);Decreased cognition;Decreased safety awareness;Decreased knowledge of use of DME or AE;Cardiopulmonary status limiting activity      OT Treatment/Interventions: Self-care/ADL training;Therapeutic exercise;Energy conservation;DME and/or AE instruction;Patient/family education;Balance training;Cognitive remediation/compensation    OT Goals(Current goals can be found in the care plan section) Acute Rehab OT Goals Patient Stated Goal: to get better and go home OT Goal Formulation: With patient Time For Goal Achievement: 03/20/21 Potential to Achieve Goals: Good ADL Goals Pt Will Perform Grooming: with modified independence;standing Pt Will Perform Lower Body Dressing: with modified independence;sit to/from stand Pt Will Transfer to Toilet: with modified independence;ambulating Additional ADL Goal #1: Pt will demonstrate anticipatory awareness for safe engagement in ADL/IADL and functional mobility.  OT Frequency: Min 2X/week   Barriers to D/C:            Co-evaluation              AM-PAC OT "6 Clicks" Daily Activity     Outcome Measure Help from another person eating meals?: None Help from another person taking care of personal grooming?: A Little Help from another person toileting, which includes using toliet, bedpan, or urinal?: A Little Help  from another person bathing (including washing, rinsing, drying)?: A Little Help from another person to put on and taking off regular upper body clothing?: None Help from another person to put on and taking off regular lower body clothing?: A Little 6 Click Score: 20   End of Session Equipment Utilized During Treatment: Oxygen Nurse Communication: Mobility  status  Activity Tolerance: Patient tolerated treatment well Patient left: in chair;with call bell/phone within reach;with chair alarm set;with nursing/sitter in room  OT Visit Diagnosis: Unsteadiness on feet (R26.81);Other abnormalities of gait and mobility (R26.89);Muscle weakness (generalized) (M62.81);Other symptoms and signs involving cognitive function                Time: 1227-1300 OT Time Calculation (min): 33 min Charges:  OT General Charges $OT Visit: 1 Visit OT Evaluation $OT Eval Moderate Complexity: 1 Mod OT Treatments $Self Care/Home Management : 8-22 mins  Helene Kelp OTR/L Acute Rehabilitation Services Office: [place] 03/06/2021, 1:26 PM

## 2021-03-06 NOTE — Consult Note (Signed)
NAME:  Michele [name], MRN:  [id number], DOB:  1970-10-13, LOS: 2 ADMISSION DATE:  03/03/2021, CONSULTATION DATE:  03/05/21 REFERRING MD:  Rise Patience, DO CHIEF COMPLAINT:  Respiratory failure, COVID-19   History of Present Illness:  50 year old female active smoker with no known PMHx admitted for acute on hypercarbic and hypoxemic respiratory secondary to COVID-19. Admitted by FPTS. Treated with remdesivir, decadron and BiPAP. Oxygen saturation on RA >94%. ABG with VBG 7/23/102/91.  She smokes 1/2-1ppd x 25 years. Has never seen a primary care physician. She currently feels anxious and that she is worried about how sick she is. She is starting to have a productive cough with green sputum. Associated with wheezing.  She has minimized her movement out of fear.   Pertinent  Medical History  No PCP  Significant Hospital Events: Including procedures, antibiotic start and stop dates in addition to other pertinent events   12/17 PCCM consulted  Interim History / Subjective:  She reports she is more comfortable on BiPAP. Remains anxious and mildly confused. Oriented to self. Repetitious speech content  Objective   Blood pressure 129/75, pulse 65, temperature 97.7 F (36.5 C), temperature source Axillary, resp. rate (!) 22, height 5\' 2"  (1.575 m), weight 99 kg, last menstrual period 03/20/2009, SpO2 95 %.        Intake/Output Summary (Last 24 hours) at 03/06/2021 1155 Last data filed at 03/05/2021 1947 Gross per 24 hour  Intake 404.81 ml  Output 650 ml  Net -245.19 ml    Filed Weights   03/04/21 2132  Weight: 99 kg   Physical Exam: General: Chronically ill-appearing, no acute distress HENT: [place], AT, OP clear, MMM, BiPAP in place Eyes: EOMI, no scleral icterus Respiratory: Diminished breath sounds bilaterally.  No crackles, wheezing or rales Cardiovascular: RRR, -M/R/G, no JVD GI: BS+, soft, nontender Extremities:-Edema,-tenderness Neuro: Mildly confused, AAO x3, CNII-XII  grossly intact Skin: Intact, no rashes or bruising Psych: Anxious mood, normal affect  VBG 7.34/82 - improving hypercarbia  TTE 50-55%. LV with no valvular or WMA. RAP 20mm Hg CTA 03/03/21 - Upper lobe predominant emphysema. No infiltrate, effusion or edema. No PE.  Resolved Hospital Problem list     Assessment & Plan:  Acute on chronic hypercarbic and hypoxemic respiratory failure in setting of COVID infection Suspected undiagnosed OSA/OHS Polycythemia likely due presumed OSA/OHS Normal echo. No s/sx of PH. Good saturations on BiPAP this am --Continue BiPAP nightly and PRN with WOB and naps --Will likely need to be discharged on BiPAP nightly. Primary team to arrange NIV with SW. --Will need outpatient sleep study  Patient with chronic respiratory failure with hypercarbia.  Previous ABG's have documented high PCO2.  Patient would benefit from non-invasive ventilation.  Without this therapy, the patient is at high risk of ending up with worsening symptoms, worsened respiratory failure, need for ER visits and/or recurrent hospitalizations.  Bilevel device unable to adequately support patient's nocturnal ventilation needs.  Patient would benefit from NIV therapy with set tidal volumes and pressure.  COVID-19 pneumonia --Supplemental O2 for goal SpO2 >88% --Monitor for s/sx respiratory failure --Complete remdesivir x 5d --Complete dexamethasone x 10d  Upper lobe predominant emphysema Active smoker --Nicotine patch --Smoking cessation --CONTINUE duonebs Q6h. Transition to Incruse prior to discharge   Best Practice (right click and "Reselect all SmartList Selections" daily)   Diet/type: Regular consistency (see orders) DVT prophylaxis: LMWH GI prophylaxis: N/A Lines: N/A Foley:  N/A Code Status:  full code Last date of multidisciplinary goals  of care discussion [Per primary team]  Critical care time:    Care Time: 50 min  Rodman Pickle, M.D. [facility] Pulmonary/Critical Care  Medicine 03/06/2021 11:55 AM   See Amion for personal pager For hours between 7 PM to 7 AM, please call El[place] for urgent questions

## 2021-03-06 NOTE — Progress Notes (Addendum)
Family Medicine Teaching Service Daily Progress Note Intern Pager: [phone]  Patient name: Michele [name] Medical record number: [id number] Date of birth: Dec 14, 1970 Age: 50 y.o. Gender: female  Primary Care Provider: Patient, No Pcp Per (Inactive) Consultants: Pulm Code Status: Full  Pt Overview and Major Events to Date:  12/15: admitted 12/17: pulm consulted  Assessment and Plan:  TIARE Michele [name] is a 50 y.o. female who presented with acute hypoxemic hypercarbic respiratory failure. PMH significant for tobacco use, otherwise no known PMH as she does not see a doctor regularly.  Acute Hypoxemic Hypercarbic Respiratory Failure COVID Did well on BiPAP overnight. Inflammatory markers stable. Overall remains anxious about her illness. -Pulm following, appreciate recommendations -Repeat VBG -Continue BiPAP for now, wean oxygen as tolerated pending VBG -Goal SpO2 88-92% -Continue Remdesevir (Day #4/5) -Continue Decadron (Day #4/10) -Airborne and Contact precautions -Daily CBC, CMP -Add guaifenesin 600mg  BID  Suspected COPD Suspect component of underlying COPD given her long hx of tobacco use, hypercarbia as above, and polycythemia. -Duonebs TID -Transition to Incruse prior to discharge -Outpatient PFTs -Goal SpO2 88-92%  ?OSA Not formally diagnosed but clinical picture concerning for component of OSA given hypercarbia. -BiPAP nightly -Outpatient sleep study  Diabetes A1c found to be 6.5% on admission. -CBG monitoring w/meals and bedtime -sSSI -Needs outpatient PCP follow-up  Tobacco Use Chronic, stable. Patient highly motivated to quit -Nicotine patch while admitted -Ongoing cessation counseling  PCP Needs -TOC consuled  FEN/GI: Regular diet PPx: Lovenox Dispo:Home pending clinical improvement   Subjective:  No acute events overnight. Patient tolerated BiPAP well. Remains somewhat anxious about her illness severity this morning but overall reports she's  doing better.  Objective: Temp:  [97.9 F (36.6 C)-98.5 F (36.9 C)] 98.4 F (36.9 C) (12/18 0000) Pulse Rate:  [55-84] 74 (12/18 0000) Resp:  [16-22] 18 (12/18 0000) BP: (126-140)/(74-93) 126/74 (12/18 0000) SpO2:  [87 %-98 %] 98 % (12/18 0000) Physical Exam: General: alert, NAD, BiPAP in place Cardiovascular: RRR, normal S1/S2 Respiratory: normal work of breathing, speaking in very full sentences Abdomen: soft, nontender Extremities: no peripheral edema  Laboratory: Recent Labs  Lab 03/04/21 0303 03/05/21 0044 03/06/21 0132  WBC 6.7 8.4 9.9  HGB 18.0* 18.8* 17.6*  HCT 58.1* 62.1* 58.0*  PLT 132* 187 161   Recent Labs  Lab 03/04/21 0303 03/05/21 0044 03/06/21 0132  NA 135 138 133*  K 5.0 4.5 4.3  CL 94* 92* 88*  CO2 36* 39* 38*  BUN 5* 8 11  CREATININE 0.59 0.67 0.53  CALCIUM 7.9* 8.7* 8.5*  PROT 6.4* 7.0 6.6  BILITOT 1.0 0.5 0.7  ALKPHOS 75 75 69  ALT 33 34 30  AST 33 22 20  GLUCOSE 126* 105* 91     Imaging/Diagnostic Tests: No new imaging/diagnostic tests over past 24 hours.  Alcus Dad, MD 03/06/2021, 4:43 AM PGY-2, [place] Intern pager: [phone], text pages welcome

## 2021-03-06 NOTE — Plan of Care (Signed)

## 2021-03-06 NOTE — Progress Notes (Signed)
FPTS Brief Progress Note  S: Patient seen at bedside for evening rounds. She was resting comfortably with BiPAP on, I did not wake her.  Spoke with primary RN, Roderic Palau, who does not have any concerns at this time. Reports she has been intermittently anxious and didn't tolerate the BiPAP for too long earlier today but she's doing better now.  O: BP 126/74 (BP Location: Left Arm)    Pulse 74    Temp 98.4 F (36.9 C) (Oral)    Resp 18    Ht 5\' 2"  (1.575 m)    Wt 99 kg    LMP 03/20/2009 (Within Years)    SpO2 98%    BMI 39.92 kg/m   Gen: resting comfortably, BiPAP in place  A/P: Acute On Chronic Hypercarbic and Hypoxemic Respiratory Failure COVID-19 COPD Suspected OSA Stable, overall unchanged from prior. She is mentating appropriately per nursing staff. Most recent ABG this afternoon with pH 7.27 and pCO2 96. -Continue BiPAP -Continue Remdesevir and Decadron -Duonebs q6h -Pulm following, appreciate recommendations -Incruse prior to d/c  - Orders reviewed. Labs for AM not ordered, which was adjusted as needed.    Alcus Dad, MD 03/06/2021, 1:46 AM PGY-2, [place] [facility] Night Resident  Please page [phone] with questions.

## 2021-03-06 NOTE — Progress Notes (Signed)
Fam Med paged and notified of critical Venous Blood Gas result PCO2= 82.4  Patient continuing on Bipap

## 2021-03-07 ENCOUNTER — Inpatient Hospital Stay (HOSPITAL_COMMUNITY): Payer: 59

## 2021-03-07 LAB — CBC WITH DIFFERENTIAL/PLATELET
Abs Immature Granulocytes: 0.02 10*3/uL (ref 0.00–0.07)
Basophils Absolute: 0 10*3/uL (ref 0.0–0.1)
Basophils Relative: 0 %
Eosinophils Absolute: 0 10*3/uL (ref 0.0–0.5)
Eosinophils Relative: 0 %
HCT: 59.6 % — ABNORMAL HIGH (ref 36.0–46.0)
Hemoglobin: 18.2 g/dL — ABNORMAL HIGH (ref 12.0–15.0)
Immature Granulocytes: 0 %
Lymphocytes Relative: 25 %
Lymphs Abs: 2.4 10*3/uL (ref 0.7–4.0)
MCH: 31.5 pg (ref 26.0–34.0)
MCHC: 30.5 g/dL (ref 30.0–36.0)
MCV: 103.1 fL — ABNORMAL HIGH (ref 80.0–100.0)
Monocytes Absolute: 1 10*3/uL (ref 0.1–1.0)
Monocytes Relative: 11 %
Neutro Abs: 6 10*3/uL (ref 1.7–7.7)
Neutrophils Relative %: 64 %
Platelets: 152 10*3/uL (ref 150–400)
RBC: 5.78 MIL/uL — ABNORMAL HIGH (ref 3.87–5.11)
RDW: 14.5 % (ref 11.5–15.5)
WBC: 9.4 10*3/uL (ref 4.0–10.5)
nRBC: 0 % (ref 0.0–0.2)

## 2021-03-07 LAB — GLUCOSE, CAPILLARY
Glucose-Capillary: 101 mg/dL — ABNORMAL HIGH (ref 70–99)
Glucose-Capillary: 137 mg/dL — ABNORMAL HIGH (ref 70–99)
Glucose-Capillary: 257 mg/dL — ABNORMAL HIGH (ref 70–99)
Glucose-Capillary: 168 mg/dL — ABNORMAL HIGH (ref 70–99)

## 2021-03-07 LAB — COMPREHENSIVE METABOLIC PANEL
ALT: 26 U/L (ref 0–44)
AST: 15 U/L (ref 15–41)
Albumin: 3.4 g/dL — ABNORMAL LOW (ref 3.5–5.0)
Alkaline Phosphatase: 64 U/L (ref 38–126)
Anion gap: 6 (ref 5–15)
BUN: 9 mg/dL (ref 6–20)
CO2: 41 mmol/L — ABNORMAL HIGH (ref 22–32)
Calcium: 8.7 mg/dL — ABNORMAL LOW (ref 8.9–10.3)
Chloride: 90 mmol/L — ABNORMAL LOW (ref 98–111)
Creatinine, Ser: 0.72 mg/dL (ref 0.44–1.00)
GFR, Estimated: >60 mL/min (ref >60)
Glucose, Bld: 107 mg/dL — ABNORMAL HIGH (ref 70–99)
Potassium: 4.6 mmol/L (ref 3.5–5.1)
Sodium: 137 mmol/L (ref 135–145)
Total Bilirubin: 0.9 mg/dL (ref 0.3–1.2)
Total Protein: 6.4 g/dL — ABNORMAL LOW (ref 6.5–8.1)

## 2021-03-07 MED ORDER — METHYLPREDNISOLONE SODIUM SUCC 125 MG IJ SOLR
80.0000 mg | Freq: Every day | INTRAMUSCULAR | Status: DC
  Administered 2021-03-08 – 2021-03-09 (×2): 80 mg via INTRAVENOUS
  Filled 2021-03-07 (×2): qty 2

## 2021-03-07 MED ORDER — SERTRALINE HCL 25 MG PO TABS
25.0000 mg | ORAL_TABLET | Freq: Every day | ORAL | Status: DC
  Administered 2021-03-08 – 2021-03-09 (×2): 25 mg via ORAL
  Filled 2021-03-07 (×2): qty 1

## 2021-03-07 MED ORDER — HYDROXYZINE HCL 25 MG PO TABS
25.0000 mg | ORAL_TABLET | Freq: Three times a day (TID) | ORAL | Status: DC | PRN
  Administered 2021-03-07 – 2021-03-08 (×2): 25 mg via ORAL
  Filled 2021-03-07 (×2): qty 1

## 2021-03-07 NOTE — Progress Notes (Signed)
NAME:  Michele [name], MRN:  [id number], DOB:  02-06-1971, LOS: 3 ADMISSION DATE:  03/03/2021, CONSULTATION DATE:  12/18 REFERRING MD:  Oleh Genin, CHIEF COMPLAINT:  Dyspnea   History of Present Illness:  50 y/o smoker admitted with COVID.  Found to have hypercarbic resp failure, emphysema on CT. Has required BIPAP.   Pertinent  Medical History  Cigarette smoker Childhood asthma  Significant Hospital Events: Including procedures, antibiotic start and stop dates in addition to other pertinent events   12/17 PCCM consult 12/19 able to tolerate being off BIPAP for > 3-4 hours  Interim History / Subjective:  Feels a little better Still short of breath but able to breathe without BIPAP for several hours at a time now Wants to eat Wants to move around Says she will never smoke again  Objective   Blood pressure (!) 141/89, pulse 73, temperature 98.2 F (36.8 C), temperature source Oral, resp. rate 18, height 5\' 2"  (1.575 m), weight 99 kg, last menstrual period 03/20/2009, SpO2 91 %.    FiO2 (%):  [50 %] 50 %  No intake or output data in the 24 hours ending 03/07/21 1012 Filed Weights   03/04/21 2132  Weight: 99 kg    Examination:  General:  Chronically ill appearing, resting comfortably in bed HENT: NCAT OP clear PULM: Poor air movement, wheezing bilaterally, normal effort CV: RRR, no mgr GI: BS+, soft, nontender MSK: normal bulk and tone Neuro: awake, alert, no distress, MAEW   Resolved Hospital Problem list     Assessment & Plan:  Acute COVID-19 Acute respiratory failure with hypercarbia Centrolobular emphysema with acute exacerbation of presumed COPD Presumed prolonged hypoxemia at home> hence polycythemia Probably OSA/OHS   Discussion: She has very sick lungs for someone who is only aged 26.  Will need close follow up in pulmonary clinic.  She still needs 3-4 days of inpatient breathing treatments and steroids.  Will need outpatient home O2  Plan: Continue  duoneb here Will change decadron to methylprednisilone for AE COPD You could stop remdesivir, very unlikely it is helping Change BIPAP to qHS Continue to wean off O2 Out of bed, mobility efforts as much as possible Counsel to quit smoking, encouraged her today as she says she will quit  Will follow  Best Practice (right click and "Reselect all SmartList Selections" daily)    Per [facility]  Labs   CBC: Recent Labs  Lab 03/03/21 1400 03/03/21 1910 03/04/21 0303 03/05/21 0044 03/06/21 0132 03/07/21 0241  WBC 6.8  --  6.7 8.4 9.9 9.4  NEUTROABS 4.1  --  4.5 5.4 6.6 6.0  HGB 18.5* 20.1* 18.0* 18.8* 17.6* 18.2*  HCT 58.8* 59.0* 58.1* 62.1* 58.0* 59.6*  MCV 102.8*  --  103.2* 103.5* 103.6* 103.1*  PLT 171  --  132* 187 161 458    Basic Metabolic Panel: Recent Labs  Lab 03/03/21 1400 03/03/21 1910 03/04/21 0303 03/05/21 0044 03/06/21 0132 03/07/21 0241  NA 138 137 135 138 133* 137  K 3.9 4.2 5.0 4.5 4.3 4.6  CL 95*  --  94* 92* 88* 90*  CO2 37*  --  36* 39* 38* 41*  GLUCOSE 116*  --  126* 105* 91 107*  BUN <5*  --  5* 8 11 9   CREATININE 0.75  --  0.59 0.67 0.53 0.72  CALCIUM 8.2*  --  7.9* 8.7* 8.5* 8.7*   GFR: Estimated Creatinine Clearance: 93.6 mL/min (by C-G formula based on SCr of 0.72 mg/dL). Recent  Labs  Lab 03/04/21 0303 03/05/21 0044 03/06/21 0132 03/07/21 0241  WBC 6.7 8.4 9.9 9.4    Liver Function Tests: Recent Labs  Lab 03/03/21 1400 03/04/21 0303 03/05/21 0044 03/06/21 0132 03/07/21 0241  AST 30 33 22 20 15   ALT 34 33 34 30 26  ALKPHOS 69 75 75 69 64  BILITOT 0.4 1.0 0.5 0.7 0.9  PROT 6.5 6.4* 7.0 6.6 6.4*  ALBUMIN 3.4* 3.4* 3.7 3.4* 3.4*   No results for input(s): LIPASE, AMYLASE in the last 168 hours. No results for input(s): AMMONIA in the last 168 hours.  ABG    Component Value Date/Time   PHART 7.273 (L) 03/05/2021 1450   PCO2ART 96.1 (HH) 03/05/2021 1450   PO2ART 122 (H) 03/05/2021 1450   HCO3 43.4 (H) 03/06/2021 1031   TCO2  42 (H) 03/03/2021 1910   O2SAT 76.6 03/06/2021 1031     Coagulation Profile: No results for input(s): INR, PROTIME in the last 168 hours.  Cardiac Enzymes: No results for input(s): CKTOTAL, CKMB, CKMBINDEX, TROPONINI in the last 168 hours.  HbA1C: Hgb A1c MFr Bld  Date/Time Value Ref Range Status  03/03/2021 07:00 PM 6.5 (H) 4.8 - 5.6 % Final    Comment:    (NOTE) Pre diabetes:          5.7%-6.4%  Diabetes:              >6.4%  Glycemic control for   <7.0% adults with diabetes     CBG: Recent Labs  Lab 03/06/21 0740 03/06/21 1153 03/06/21 1605 03/06/21 2041 03/07/21 0744  GLUCAP 87 95 151* 171* 101*   Critical care time: n/a     Roselie Awkward, MD [place] PCCM Pager: [phone] Cell: [phone] After 7:00 pm call Elink  [phone]

## 2021-03-07 NOTE — Progress Notes (Signed)
Pt in no distress at this time requiring bipap at this time.  Rt will cont you monitor.

## 2021-03-07 NOTE — Progress Notes (Signed)
Family Medicine Teaching Service Daily Progress Note Intern Pager: [phone]  Patient name: Michele [name] Medical record number: [id number] Date of birth: Dec 22, 1970 Age: 50 y.o. Gender: female  Primary Care Provider: Patient, No Pcp Per (Inactive) Consultants: PCCM Code Status: Full   Pt Overview and Major Events to Date:  12/15: Admitted to [place] 12/17: Pulm consulted  Assessment and Plan:  Michele [name] is a 50 year old female presenting with acute hypoxemic hypercarbic respiratory failure.  Past medical history significant for tobacco use, no other known past medical history she does not see a doctor regularly.  Acute Hypoxemic Hypercarbic Respiratory Failure   COVID  PCCM consulted, continued with BiPaP and inflammatory markers are stable. VBG pCO2 82.4, pH 7.3, remains hypercarbic. Continuing with BiPaP nightly and as needed during the day. Very anxious still about prognosis.  Given that the patient has been very slowly improving, we can continue with a chest x-ray today to see if there is change.  Additionally, can consider Lasix - Pulmonology following, appreciate recommendations - Continuing BiPAP as needed and at night - Goal SPO2 88 to 92% - Continue remdesivir day 5/5, completed today - Continue Decadron day 5 of 10 - Airborne and contact precautions - Daily CBC, CMP - Guaifenesin 600 mg twice daily  Anxious mood Needs work-up for anxiety outpatient with GAD.  Currently she is taking Atarax 10 mg 3 times daily as needed with last dose this morning. -- Increase Atarax dose to 25 mg 3xdaily as needed -- Will discuss starting Sertraline to continue outpatient   Suspected COPD Likely underlying COPD with long history of tobacco use, hypercarbia and polycythemia. - DuoNebs 3 times daily - Incruse prior to discharge - Outpatient PFTs - Goal SPO2 88 to 92%  OSA? No formal diagnosis but as discussed above with hypercarbia and polycythemia, very likely. - BiPAP nightly  and as needed for naps - Outpatient sleep study  Diabetes 151 > 171> 101.  Was not on home medications  -- CBG monitoring with meals and at bedtime - sSSI  FEN/GI: Regular diet  PPx: Lovenox  Dispo:Home pending clinical improvement . Barriers include clinical improvement.   Subjective:  Pt is much improved today. Reports that she is less confused.   Objective: Temp:  [97.6 F (36.4 C)-98.2 F (36.8 C)] 97.9 F (36.6 C) (12/19 1144) Pulse Rate:  [55-81] 81 (12/19 1144) Resp:  [12-23] 18 (12/19 1144) BP: (128-149)/(75-97) 144/85 (12/19 1144) SpO2:  [90 %-97 %] 90 % (12/19 1144) FiO2 (%):  [50 %] 50 % (12/19 0339) Physical Exam: General: NAD, sitting up in bed with [place] 6L  Cardiovascular: RRR, no m/g/r Respiratory: Inspiratory and expiratory wheezing elicited without focal diminishments Abdomen: Nontender  Extremities: FROM   Laboratory: Recent Labs  Lab 03/05/21 0044 03/06/21 0132 03/07/21 0241  WBC 8.4 9.9 9.4  HGB 18.8* 17.6* 18.2*  HCT 62.1* 58.0* 59.6*  PLT 187 161 152   Recent Labs  Lab 03/05/21 0044 03/06/21 0132 03/07/21 0241  NA 138 133* 137  K 4.5 4.3 4.6  CL 92* 88* 90*  CO2 39* 38* 41*  BUN 8 11 9   CREATININE 0.67 0.53 0.72  CALCIUM 8.7* 8.5* 8.7*  PROT 7.0 6.6 6.4*  BILITOT 0.5 0.7 0.9  ALKPHOS 75 69 64  ALT 34 30 26  AST 22 20 15   GLUCOSE 105* 91 107*     Erskine Emery, MD 03/07/2021, 12:07 PM PGY-1, [place] Intern pager: [phone], text pages welcome

## 2021-03-07 NOTE — Plan of Care (Signed)

## 2021-03-07 NOTE — Progress Notes (Signed)
FPTS Brief Progress Note  S:Sleeping.    O: BP 139/81 (BP Location: Left Arm)    Pulse 75    Temp 98.5 F (36.9 C) (Oral)    Resp 18    Ht 5\' 2"  (1.575 m)    Wt 99 kg    LMP 03/20/2009 (Within Years)    SpO2 93%    BMI 39.92 kg/m   General: Sleeping. No acute distress. Age appropriate. Respiratory: normal effort    A/P: Acute Hypoxemic Hypercarbic Respiratory Failure   COVID  - Reviewed pulm recs - Orders reviewed. Labs for AM ordered, which was adjusted as needed.    Gerlene Fee, DO 03/07/2021, 10:22 PM PGY-3, [place] Night Resident  Please page [phone] with questions.

## 2021-03-07 NOTE — Evaluation (Signed)
Physical Therapy Evaluation Patient Details Name: MCKELL RIECKE MRN: [id number] DOB: 20-May-1970 Today's Date: 03/07/2021  History of Present Illness  Chelsa Stout is a 50 year old female presenting with acute respiratory failure secondary to [place].  Patient does not regularly have a PCP, so no documented past medical history. Pt received A1c on this admission that was 6.5.  Clinical Impression   Pt admitted secondary to problem above with deficits below. PTA patient was independent and living alone.  Pt currently requires minguard assist for ambulation to monitor sats as she tends to desaturate with activity (primarily due to excessive talking despite encouragement to stop talking and just walk).  Anticipate patient will benefit from PT to address problems listed below.Will continue to follow acutely to maximize functional mobility independence and safety.          Recommendations for follow up therapy are one component of a multi-disciplinary discharge planning process, led by the attending physician.  Recommendations may be updated based on patient status, additional functional criteria and insurance authorization.  Follow Up Recommendations No PT follow up    Assistance Recommended at Discharge None  Functional Status Assessment Patient has had a recent decline in their functional status and demonstrates the ability to make significant improvements in function in a reasonable and predictable amount of time.  Equipment Recommendations  None recommended by PT    Recommendations for Other Services       Precautions / Restrictions Precautions Precautions: Fall Precaution Comments: watch O2      Mobility  Bed Mobility Overal bed mobility: Modified Independent             General bed mobility comments: hob elevated    Transfers Overall transfer level: Needs assistance Equipment used: None Transfers: Sit to/from Stand;Bed to chair/wheelchair/BSC Sit to Stand:  Supervision           General transfer comment: supervision for safety with lines; no imbalance    Ambulation/Gait Ambulation/Gait assistance: Min guard Gait Distance (Feet): 50 Feet (x3 with standing rest breaks) Assistive device: IV Pole;None Gait Pattern/deviations: Step-through pattern;Decreased stride length Gait velocity: slowed appropriately based on O2 needs Gait velocity interpretation: 1.31 - 2.62 ft/sec, indicative of limited community ambulator   General Gait Details: initially holding onto IV pole; progressed to no device with no imbalance; standing rest breaks  Stairs            Wheelchair Mobility    Modified Rankin (Stroke Patients Only)       Balance Overall balance assessment: No apparent balance deficits (not formally assessed)                                           Pertinent Vitals/Pain Pain Assessment: No/denies pain    Home Living Family/patient expects to be discharged to:: Private residence Living Arrangements: Alone Available Help at Discharge: Friend(s);Available PRN/intermittently Type of Home: House Home Access: Stairs to enter Entrance Stairs-Rails: None Entrance Stairs-Number of Steps: 3   Home Layout: One level Home Equipment: None Additional Comments: pt has 2 boys that live close by    Prior Function Prior Level of Function : Independent/Modified Independent             Mobility Comments: ambulating without AD ADLs Comments: friends will drive pt.     Hand Dominance   Dominant Hand: Right    Extremity/Trunk Assessment   Upper Extremity  Assessment Upper Extremity Assessment: Defer to OT evaluation    Lower Extremity Assessment Lower Extremity Assessment: Overall WFL for tasks assessed    Cervical / Trunk Assessment Cervical / Trunk Assessment: Normal  Communication   Communication: No difficulties  Cognition Arousal/Alertness: Awake/alert Behavior During Therapy: WFL for tasks  assessed/performed Overall Cognitive Status: No family/caregiver present to determine baseline cognitive functioning (reports she is "a bit foggy") Area of Impairment: Awareness;Problem solving                         Safety/Judgement: Decreased awareness of safety Awareness: Emergent Problem Solving: Slow processing;Requires verbal cues;Requires tactile cues General Comments: Reports she was very confused on previous days (could recall several instances) and is still a bit off from normal for her. Poor safety awareness with lines and required cuing to limit talking despite her stating that she knew she needed to talk less to keep her oxygen up.        General Comments General comments (skin integrity, edema, etc.): on 6L [place] O2 with sats 93-96% with walking when NOT talking; with talking decr to 85% on 6L (pt not on HFNC and could not incr >6L)    Exercises     Assessment/Plan    PT Assessment Patient needs continued PT services  PT Problem List Decreased activity tolerance;Decreased mobility;Decreased cognition;Decreased safety awareness;Cardiopulmonary status limiting activity       PT Treatment Interventions Gait training;Stair training;Functional mobility training;Therapeutic activities;Therapeutic exercise;Cognitive remediation;Patient/family education    PT Goals (Current goals can be found in the Care Plan section)  Acute Rehab PT Goals Patient Stated Goal: get stronger and go home PT Goal Formulation: With patient Time For Goal Achievement: 03/21/21 Potential to Achieve Goals: Good    Frequency Min 3X/week   Barriers to discharge        Co-evaluation               AM-PAC PT "6 Clicks" Mobility  Outcome Measure Help needed turning from your back to your side while in a flat bed without using bedrails?: None Help needed moving from lying on your back to sitting on the side of a flat bed without using bedrails?: None Help needed moving to and from a  bed to a chair (including a wheelchair)?: A Little Help needed standing up from a chair using your arms (e.g., wheelchair or bedside chair)?: A Little Help needed to walk in hospital room?: A Little Help needed climbing 3-5 steps with a railing? : A Little 6 Click Score: 20    End of Session Equipment Utilized During Treatment: Oxygen Activity Tolerance: Treatment limited secondary to medical complications (Comment) (desats) Patient left: in chair;with call bell/phone within reach Nurse Communication: Mobility status;Other (comment) (sats on 6L) PT Visit Diagnosis: Difficulty in walking, not elsewhere classified (R26.2)    Time: 0017-4944 PT Time Calculation (min) (ACUTE ONLY): 23 min   Charges:   PT Evaluation $PT Eval Low Complexity: [place], PT Acute Rehabilitation Services  Pager [phone] Office [phone]   Rexanne Mano 03/07/2021, 11:19 AM

## 2021-03-07 NOTE — TOC Benefit Eligibility Note (Signed)
Transition of Care [facility]) Benefit Eligibility Note    Patient Details  Name: Michele [name] MRN: [id number] Date of Birth: 06/27/1970   Medication/Dose: Margretta Ditty INHALER  Covered?: No        Spoke with Person/Company/Phone Number:: GENE  @ [employer] Y[employer] #  [phone]     Prior Approval: Yes [id number])          Memory Argue Phone Number: 03/07/2021, 11:56 AM

## 2021-03-08 ENCOUNTER — Other Ambulatory Visit (HOSPITAL_COMMUNITY): Payer: Self-pay

## 2021-03-08 DIAGNOSIS — J441 Chronic obstructive pulmonary disease with (acute) exacerbation: Secondary | ICD-10-CM

## 2021-03-08 LAB — COMPREHENSIVE METABOLIC PANEL
ALT: 24 U/L (ref 0–44)
AST: 15 U/L (ref 15–41)
Albumin: 3.6 g/dL (ref 3.5–5.0)
Alkaline Phosphatase: 72 U/L (ref 38–126)
Anion gap: 9 (ref 5–15)
BUN: 12 mg/dL (ref 6–20)
CO2: 39 mmol/L — ABNORMAL HIGH (ref 22–32)
Calcium: 9.5 mg/dL (ref 8.9–10.3)
Chloride: 91 mmol/L — ABNORMAL LOW (ref 98–111)
Creatinine, Ser: 0.58 mg/dL (ref 0.44–1.00)
GFR, Estimated: >60 mL/min (ref >60)
Glucose, Bld: 131 mg/dL — ABNORMAL HIGH (ref 70–99)
Potassium: 4.2 mmol/L (ref 3.5–5.1)
Sodium: 139 mmol/L (ref 135–145)
Total Bilirubin: 0.9 mg/dL (ref 0.3–1.2)
Total Protein: 7.2 g/dL (ref 6.5–8.1)

## 2021-03-08 LAB — GLUCOSE, CAPILLARY
Glucose-Capillary: 86 mg/dL (ref 70–99)
Glucose-Capillary: 207 mg/dL — ABNORMAL HIGH (ref 70–99)
Glucose-Capillary: 218 mg/dL — ABNORMAL HIGH (ref 70–99)
Glucose-Capillary: 260 mg/dL — ABNORMAL HIGH (ref 70–99)

## 2021-03-08 LAB — CBC WITH DIFFERENTIAL/PLATELET
Abs Immature Granulocytes: 0.03 10*3/uL (ref 0.00–0.07)
Basophils Absolute: 0 10*3/uL (ref 0.0–0.1)
Basophils Relative: 0 %
Eosinophils Absolute: 0 10*3/uL (ref 0.0–0.5)
Eosinophils Relative: 0 %
HCT: 60.2 % — ABNORMAL HIGH (ref 36.0–46.0)
Hemoglobin: 18.9 g/dL — ABNORMAL HIGH (ref 12.0–15.0)
Immature Granulocytes: 0 %
Lymphocytes Relative: 22 %
Lymphs Abs: 2.6 10*3/uL (ref 0.7–4.0)
MCH: 31.4 pg (ref 26.0–34.0)
MCHC: 31.4 g/dL (ref 30.0–36.0)
MCV: 100 fL (ref 80.0–100.0)
Monocytes Absolute: 1.3 10*3/uL — ABNORMAL HIGH (ref 0.1–1.0)
Monocytes Relative: 11 %
Neutro Abs: 7.9 10*3/uL — ABNORMAL HIGH (ref 1.7–7.7)
Neutrophils Relative %: 67 %
Platelets: 176 10*3/uL (ref 150–400)
RBC: 6.02 MIL/uL — ABNORMAL HIGH (ref 3.87–5.11)
RDW: 14.1 % (ref 11.5–15.5)
WBC: 11.8 10*3/uL — ABNORMAL HIGH (ref 4.0–10.5)
nRBC: 0 % (ref 0.0–0.2)

## 2021-03-08 MED ORDER — FUROSEMIDE 10 MG/ML IJ SOLN
20.0000 mg | Freq: Once | INTRAMUSCULAR | Status: AC
  Administered 2021-03-08: 12:00:00 20 mg via INTRAVENOUS
  Filled 2021-03-08: qty 2

## 2021-03-08 NOTE — TOC Progression Note (Addendum)
Transition of Care [facility]) - Progression Note    Patient Details  Name: MISK GALENTINE MRN: [id number] Date of Birth: May 09, 1970  Transition of [facility]) CM/SW Contact  Carles Collet, RN Phone Number: 03/08/2021, 11:34 AM  Clinical Narrative:   Damaris Schooner w patient over the phone. She states that she will have Friday insurance starting Jan 1st. She states that her current plan only covers generic meds. Sent message to MD team to notify that Josem Kaufmann is required for incruse inhaler, provided with number to call. Spriva inhaler is $200, patient has $4,950 deductible.  Requested CMA to schedule at [facility], teaching service does not have availability to accept new clients. Follow up appointment scheduled for Feb 1 at 0930 Dr Margarita Rana [facility] Patient plans to DC to home, she lives alone, states her two sons will support her as needed.  Needs- BiPAP delivered prior to DC (has been ordered through [employer]) Possibly O2    Expected Discharge Plan: Home/Self Care Barriers to Discharge: Continued Medical Work up  Expected Discharge Plan and Services Expected Discharge Plan: Home/Self Care   Discharge Planning Services: CM Consult Post Acute Care Choice: NA Living arrangements for the past 2 months: Single Family Home                                       Social Determinants of Health (SDOH) Interventions    Readmission Risk Interventions No flowsheet data found.

## 2021-03-08 NOTE — Progress Notes (Signed)
FPTS Brief Progress Note  S: Resting in bed. Phlebotomist in room. BiPAP mask is on.    O: BP 126/77 (BP Location: Left Arm)    Pulse 77    Temp 98.3 F (36.8 C) (Oral)    Resp 19    Ht 5\' 2"  (1.575 m)    Wt 99 kg    LMP 03/20/2009 (Within Years)    SpO2 92%    BMI 39.92 kg/m   General:No acute distress. Age appropriate. Respiratory: normal effort   A/P: Acute Hypoxemic Hypercarbic Respiratory Failure   COVID  - Orders reviewed. Labs for AM ordered, which was adjusted as needed.  - Continue BiPAP - wean O2 as available  Michele [name], Michele Lavergne, DO 03/08/2021, 10:13 PM PGY-3, [place] [facility] Night Resident  Please page [phone] with questions.

## 2021-03-08 NOTE — Progress Notes (Incomplete)
Inpatient Diabetes Program Recommendations  AACE/ADA: New Consensus Statement on Inpatient Glycemic Control (2015)  Target Ranges:  Prepandial:   less than 140 mg/dL      Peak postprandial:   less than 180 mg/dL (1-2 hours)      Critically ill patients:  140 - 180 mg/dL   Lab Results  Component Value Date   GLUCAP 86 03/08/2021   HGBA1C 6.5 (H) 03/03/2021    Review of Glycemic Control  Diabetes history: *** Outpatient Diabetes medications: *** Current orders for Inpatient glycemic control: ***  Inpatient Diabetes Program Recommendations:

## 2021-03-08 NOTE — Plan of Care (Signed)

## 2021-03-08 NOTE — Discharge Instructions (Addendum)
Thank you for letting us care for you during your stay.  You were admitted to the [facility] Medicine Teaching Service.   You were admitted for COVID and the need for oxygen.  We found the following during your stay COVID + and anxious mood.   We recommend follow up specifically for multiple items. We need you to follow up within the next week to check oxygen status and anxiety, as we started you on atarax and sertraline for your anxiety in addition to Spiriva and Albuterol to help with your breathing. You also need to see a PCP to evaluate you with lung function testing to evaluate you for COPD. You also need a sleep study and could likely benefit from therapy to assist with some of the anxiety you have been experiencing.  Please complete your Incruse inhaler prior to transitioning to the Spiriva while home. You need to wear the BiPaP nightly and with naps and continuously use the oxygen otherwise.   Please follow up with your primary care physician in 1 week. If you begin to experience worsening shortness of breath, chest pain, confusion, uncontrollable pain or fever, please seek healthcare immediately.   If your symptoms worsen or return, please return to the hospital.  Please let us know if you have questions about your stay at [facility].

## 2021-03-08 NOTE — Progress Notes (Signed)
Family Medicine Teaching Service Daily Progress Note Intern Pager: [phone]  Patient name: Michele [name] Medical record number: [id number] Date of birth: 01-Jun-1970 Age: 50 y.o. Gender: female  Primary Care Provider: Patient, No Pcp Per (Inactive) Consultants: PCCM Code Status: Full   Pt Overview and Major Events to Date:  12/15: Admitted to [place] 12/17: Pulm consulted  Assessment and Plan:  Michele [name] is a 50 year old female presenting with acute hypoxic hypercarbic respiratory failure. PMHx insignificant prior to admission given lack of PCP.   Acute hypoxemic hypercarbic respiratory failure  COVID-19 Continuing with BiPAP at night and as needed with naps.  Currently, the patient is on 6 L nasal cannula while awake.  She is tolerating this well.  Chest x-ray obtained yesterday does not show acute change from previous, but difficult to assess given quality of CXR.  We will likely continue with a dose of Lasix today. - Pulmonology following, appreciate recommendations - Continuing BiPAP as needed at night - Goal SPO2 88 to 93% - Transitioned from Decadron to Solu-medrol - Airborne and contact precautions -- Guaifenesin 600 mg 2xdaily  -- Lasix 20 mg IV   Anxious Mood  Currently the patient is on Atarax 25 mg 3 times daily and we will start sertraline today, discussed with patient. - Atarax 25 mg 3 times daily as needed - Starting sertraline  Suspected COPD  Likely underlying COPD with long h/o tobacco use, hypercarbia, and polycythemia.  -- Duonebs 3xdaily  -- Incruse>Spiriva given pre-auth transition prior to discharge  -- Goal SpO2 88 to 92%  OSA?  No formal dx but likely given hypercarbia and polycythemia.  -- BiPaP nightly and as needed -- Qualifies for home BiPaP -- Outpt sleep study   Diabetes  257>168 CBG.  -- CBG with meals and bedtime -- sSSI   FEN/GI: Regular diet  PPx: Lovenox  Dispo:Home in 2-3 days. Barriers include clinical improvement.    Subjective:  Pt is doing well this morning, but did not use her BiPaP overnight.   Objective: Temp:  [98.2 F (36.8 C)-98.5 F (36.9 C)] 98.2 F (36.8 C) (12/20 0739) Pulse Rate:  [75-87] 75 (12/20 0834) Resp:  [17-18] 18 (12/20 0834) BP: (138-149)/(81-94) 138/93 (12/20 0739) SpO2:  [89 %-94 %] 90 % (12/20 0834) Physical Exam: General: NAD, sitting up in bed  Cardiovascular: RRR Respiratory: Good air movement with no focal diminishment  Abdomen: Nontender  Extremities: FROM   Laboratory: Recent Labs  Lab 03/06/21 0132 03/07/21 0241 03/08/21 0158  WBC 9.9 9.4 11.8*  HGB 17.6* 18.2* 18.9*  HCT 58.0* 59.6* 60.2*  PLT 161 152 176   Recent Labs  Lab 03/06/21 0132 03/07/21 0241 03/08/21 0158  NA 133* 137 139  K 4.3 4.6 4.2  CL 88* 90* 91*  CO2 38* 41* 39*  BUN 11 9 12   CREATININE 0.53 0.72 0.58  CALCIUM 8.5* 8.7* 9.5  PROT 6.6 6.4* 7.2  BILITOT 0.7 0.9 0.9  ALKPHOS 69 64 72  ALT 30 26 24   AST 20 15 15   GLUCOSE 91 107* 131*      Imaging/Diagnostic Tests: CXR: No acute changes from previous, difficult to assess   Erskine Emery, MD 03/08/2021, 11:56 AM PGY-1, [place] Intern pager: [phone], text pages welcome

## 2021-03-08 NOTE — TOC Benefit Eligibility Note (Signed)
Patient Advocate Encounter   Received notification that prior authorization for Incruse Ellipta inhaler is required.   PA submitted on 03/08/2021 Key [id number] Status is pending       Lyndel Safe, [place] Patient Advocate Specialist [place] Patient Advocate Team Direct Number: [phone]  Fax: [phone]

## 2021-03-08 NOTE — Progress Notes (Signed)
NAME:  Michele [name], MRN:  [id number], DOB:  09-Oct-1970, LOS: 4 ADMISSION DATE:  03/03/2021, CONSULTATION DATE:  12/18 REFERRING MD:  Oleh Genin, CHIEF COMPLAINT:  Dyspnea   History of Present Illness:  50 y/o smoker admitted with COVID.  Found to have hypercarbic resp failure, emphysema on CT. Has required BIPAP.  Pertinent  Medical History  Cigarette smoker Childhood asthma  Significant Hospital Events: Including procedures, antibiotic start and stop dates in addition to other pertinent events   12/17 PCCM consult 12/19 able to tolerate being off BIPAP for > 3-4 hours  Interim History / Subjective:   Feels better today Breathing has improved Remains on oxygen  Objective   Blood pressure (!) 138/93, pulse 87, temperature 98.2 F (36.8 C), temperature source Oral, resp. rate 18, height 5\' 2"  (1.575 m), weight 99 kg, last menstrual period 03/20/2009, SpO2 (!) 89 %.       No intake or output data in the 24 hours ending 03/08/21 0802 Filed Weights   03/04/21 2132  Weight: 99 kg    Examination:  General:  Resting comfortably in bed HENT: NCAT OP clear PULM:  B, normal effort CV: RRR, no mgr GI: BS+, soft, nontender MSK: normal bulk and tone Neuro: awake, alert, no distress, MAEW    Resolved Hospital Problem list     Assessment & Plan:  Acute COVID-19 Acute respiratory failure with hypercarbia Centrolobular emphysema with acute exacerbation of presumed COPD Presumed prolonged hypoxemia at home> hence polycythemia Probably OSA/OHS   Discussion: She is better today, but still very sick for someone only aged 89. Not medically ready for discharge while requiring 6L O2.  Plan: Continue solumedrol as ordered BIPAP qHS Wean off O2 Ambulate Out of bed Counsel to quit smoking Continue duoneb for now  Will follow  Best Practice (right click and "Reselect all SmartList Selections" daily)    Per [facility]  Labs   CBC: Recent Labs  Lab 03/04/21 0303  03/05/21 0044 03/06/21 0132 03/07/21 0241 03/08/21 0158  WBC 6.7 8.4 9.9 9.4 11.8*  NEUTROABS 4.5 5.4 6.6 6.0 7.9*  HGB 18.0* 18.8* 17.6* 18.2* 18.9*  HCT 58.1* 62.1* 58.0* 59.6* 60.2*  MCV 103.2* 103.5* 103.6* 103.1* 100.0  PLT 132* 187 161 152 659    Basic Metabolic Panel: Recent Labs  Lab 03/04/21 0303 03/05/21 0044 03/06/21 0132 03/07/21 0241 03/08/21 0158  NA 135 138 133* 137 139  K 5.0 4.5 4.3 4.6 4.2  CL 94* 92* 88* 90* 91*  CO2 36* 39* 38* 41* 39*  GLUCOSE 126* 105* 91 107* 131*  BUN 5* 8 11 9 12   CREATININE 0.59 0.67 0.53 0.72 0.58  CALCIUM 7.9* 8.7* 8.5* 8.7* 9.5   GFR: Estimated Creatinine Clearance: 93.6 mL/min (by C-G formula based on SCr of 0.58 mg/dL). Recent Labs  Lab 03/05/21 0044 03/06/21 0132 03/07/21 0241 03/08/21 0158  WBC 8.4 9.9 9.4 11.8*    Liver Function Tests: Recent Labs  Lab 03/04/21 0303 03/05/21 0044 03/06/21 0132 03/07/21 0241 03/08/21 0158  AST 33 22 20 15 15   ALT 33 34 30 26 24   ALKPHOS 75 75 69 64 72  BILITOT 1.0 0.5 0.7 0.9 0.9  PROT 6.4* 7.0 6.6 6.4* 7.2  ALBUMIN 3.4* 3.7 3.4* 3.4* 3.6   No results for input(s): LIPASE, AMYLASE in the last 168 hours. No results for input(s): AMMONIA in the last 168 hours.  ABG    Component Value Date/Time   PHART 7.273 (L) 03/05/2021 1450  PCO2ART 96.1 (HH) 03/05/2021 1450   PO2ART 122 (H) 03/05/2021 1450   HCO3 43.4 (H) 03/06/2021 1031   TCO2 42 (H) 03/03/2021 1910   O2SAT 76.6 03/06/2021 1031     Coagulation Profile: No results for input(s): INR, PROTIME in the last 168 hours.  Cardiac Enzymes: No results for input(s): CKTOTAL, CKMB, CKMBINDEX, TROPONINI in the last 168 hours.  HbA1C: Hgb A1c MFr Bld  Date/Time Value Ref Range Status  03/03/2021 07:00 PM 6.5 (H) 4.8 - 5.6 % Final    Comment:    (NOTE) Pre diabetes:          5.7%-6.4%  Diabetes:              >6.4%  Glycemic control for   <7.0% adults with diabetes     CBG: Recent Labs  Lab 03/07/21 0744  03/07/21 1146 03/07/21 1618 03/07/21 2032 03/08/21 0738  GLUCAP 101* 137* 257* 168* 86   Critical care time: n/a     Roselie Awkward, MD [place] P[facility]M Pager: [phone] Cell: [phone] After 7:00 pm call Elink  [phone]

## 2021-03-09 ENCOUNTER — Other Ambulatory Visit (HOSPITAL_COMMUNITY): Payer: Self-pay

## 2021-03-09 DIAGNOSIS — F419 Anxiety disorder, unspecified: Secondary | ICD-10-CM

## 2021-03-09 DIAGNOSIS — E119 Type 2 diabetes mellitus without complications: Secondary | ICD-10-CM

## 2021-03-09 DIAGNOSIS — G4733 Obstructive sleep apnea (adult) (pediatric): Secondary | ICD-10-CM

## 2021-03-09 HISTORY — DX: Anxiety disorder, unspecified: F41.9

## 2021-03-09 LAB — BASIC METABOLIC PANEL
Anion gap: 11 (ref 5–15)
BUN: 17 mg/dL (ref 6–20)
CO2: 35 mmol/L — ABNORMAL HIGH (ref 22–32)
Calcium: 9.2 mg/dL (ref 8.9–10.3)
Chloride: 90 mmol/L — ABNORMAL LOW (ref 98–111)
Creatinine, Ser: 0.89 mg/dL (ref 0.44–1.00)
GFR, Estimated: >60 mL/min (ref >60)
Glucose, Bld: 145 mg/dL — ABNORMAL HIGH (ref 70–99)
Potassium: 3.7 mmol/L (ref 3.5–5.1)
Sodium: 136 mmol/L (ref 135–145)

## 2021-03-09 LAB — CBC
HCT: 59.9 % — ABNORMAL HIGH (ref 36.0–46.0)
Hemoglobin: 19.8 g/dL — ABNORMAL HIGH (ref 12.0–15.0)
MCH: 32.1 pg (ref 26.0–34.0)
MCHC: 33.1 g/dL (ref 30.0–36.0)
MCV: 97.2 fL (ref 80.0–100.0)
Platelets: 209 10*3/uL (ref 150–400)
RBC: 6.16 MIL/uL — ABNORMAL HIGH (ref 3.87–5.11)
RDW: 14.1 % (ref 11.5–15.5)
WBC: 15.3 10*3/uL — ABNORMAL HIGH (ref 4.0–10.5)
nRBC: 0 % (ref 0.0–0.2)

## 2021-03-09 LAB — GLUCOSE, CAPILLARY
Glucose-Capillary: 110 mg/dL — ABNORMAL HIGH (ref 70–99)
Glucose-Capillary: 177 mg/dL — ABNORMAL HIGH (ref 70–99)

## 2021-03-09 MED ORDER — PREDNISONE 5 MG PO TABS
30.0000 mg | ORAL_TABLET | Freq: Every day | ORAL | Status: DC
  Administered 2021-03-09: 13:00:00 30 mg via ORAL
  Filled 2021-03-09 (×2): qty 2

## 2021-03-09 MED ORDER — HYDROXYZINE HCL 25 MG PO TABS
25.0000 mg | ORAL_TABLET | Freq: Three times a day (TID) | ORAL | 0 refills | Status: DC | PRN
  Filled 2021-03-09: qty 30, 10d supply, fill #0

## 2021-03-09 MED ORDER — SERTRALINE HCL 25 MG PO TABS
25.0000 mg | ORAL_TABLET | Freq: Every day | ORAL | 0 refills | Status: DC
  Filled 2021-03-09: qty 30, 30d supply, fill #0

## 2021-03-09 MED ORDER — NICOTINE 7 MG/24HR TD PT24
7.0000 mg | MEDICATED_PATCH | Freq: Every day | TRANSDERMAL | 0 refills | Status: DC
  Filled 2021-03-09: qty 28, 28d supply, fill #0

## 2021-03-09 MED ORDER — ALBUTEROL SULFATE HFA 108 (90 BASE) MCG/ACT IN AERS
2.0000 | INHALATION_SPRAY | RESPIRATORY_TRACT | 0 refills | Status: DC | PRN
  Filled 2021-03-09: qty 18, 18d supply, fill #0

## 2021-03-09 MED ORDER — PREDNISONE 20 MG PO TABS
40.0000 mg | ORAL_TABLET | Freq: Every day | ORAL | 0 refills | Status: AC
  Filled 2021-03-09: qty 6, 3d supply, fill #0

## 2021-03-09 MED ORDER — GUAIFENESIN ER 600 MG PO TB12: 600.0000 mg | ORAL_TABLET | Freq: Two times a day (BID) | ORAL | Status: DC

## 2021-03-09 MED ORDER — INSULIN ASPART 100 UNIT/ML IJ SOLN
0.0000 [IU] | Freq: Three times a day (TID) | INTRAMUSCULAR | Status: DC
  Administered 2021-03-09: 13:00:00 3 [IU] via SUBCUTANEOUS

## 2021-03-09 MED ORDER — PREDNISONE 20 MG PO TABS: 40.0000 mg | ORAL_TABLET | Freq: Every day | ORAL | Status: DC

## 2021-03-09 MED ORDER — SPIRIVA RESPIMAT 2.5 MCG/ACT IN AERS
18.0000 ug | INHALATION_SPRAY | Freq: Every day | RESPIRATORY_TRACT | 2 refills | Status: DC
  Filled 2021-03-09: qty 4, 30d supply, fill #0

## 2021-03-09 NOTE — Discharge Summary (Signed)
[facility] Discharge Summary  Patient name: Michele [name] Medical record number: [id number] Date of birth: 1970-10-12 Age: 50 y.o. Gender: female Date of Admission: 03/03/2021  Date of Discharge: 03/09/21 Admitting Physician: Martyn Malay, MD  Primary Care Provider: Patient, No Pcp Per (Inactive) Consultants: Pulmonology   Indication for Hospitalization: SOB requiring oxygen   Discharge Diagnoses/Problem List:  Principal Problem:   Acute respiratory failure due to COVID-19 [facility]) Active Problems:   Tobacco use   COVID-19   OSA treated with BiPAP   Anxious mood   Diabetes mellitus without complication ([place])  Disposition: Home (HHPT recommended but declined)  Discharge Condition: Stable   Discharge Exam:  Blood pressure 124/75, pulse 77, temperature 98.3 F (36.8 C), temperature source Oral, resp. rate 20, height _0  (1.575 m), weight 99 kg, last menstrual period 03/20/2009, SpO2 92 %. Physical Exam: General: Not ill-appearing  Cardiovascular: RRR Respiratory: Expiratory wheezing with good air movement  Abdomen: nontender Extremities: FROM   Brief Hospital Course:  Michele [name] is a 50 year old female was admitted to [facility] presenting with shortness of breath and was found to be COVID-positive. Hospital Course is outlined below:   Acute Hypoxemic Hypercarbic Respiratory Failure   COVID-19 Patient initially arrived at the ED and had very low oxygen saturation on room air of 68%.  Of note, the patient also had VBG that showed fairly significant hypercarbia to 80 with a pH of 7.3.  Patient was found to be COVID-positive.  On CT scan there appeared to be underlying lung disease, likely emphysema.  Given COVID diagnosis, patient was started on remdesivir for 5 days and Decadron for total steroid of 10 days in addition to retrieving inflammatory labs.  Decadron was transitioned to Solu-Medrol which was transitioned to prednisone prior to discharge.   When the patient was sleeping throughout the night, she would become very confused and VBG showed increasing hypercarbia.  We consulted pulmonology and they recommended to continue with BiPAP, BiPAP was used at night and as needed during the day.  Additionally, both chest x-rays did not show any evidence of pneumonia or focal consolidation.  1 dose of IV Lasix was given for the patient to improve oxygenation. Throughout the course of her stay, she was able to wean down to 2 L of oxygen while ambulating and maintained an oxygen saturation of 88 to 92%.  Ultimately, the patient met qualifications to take BiPAP home to use at night and was instructed to continue with oxygen at 2 L during the day with close follow-up.  Anxious mood Patient was started on Atarax 25 mg 3 times daily as needed and sertraline 25 mg to assist with her anxiety surrounding her diagnosis.  She seems to have this chronic anxiety, so we instructed her to follow-up with the PCP outpatient.  Discharged the patient on Atarax and sertraline as above.  Suspected COPD Patient does not have a PCP, so she has never received PFTs.  She has a long standing history of tobacco use with presence of hypercarbia and polycythemia on this admission.  Likely that this patient has COPD.  She needs PFTs outpatient.  We will send her home on Spiriva in addition to albuterol.  Ultimately, the patient was transitioned from Solu-Medrol to prednisone outpatient as well.  OSA This is an informal diagnosis this patient needs a sleep study outpatient.  As mentioned above, the patient has fairly significant hypercarbia that worsens while sleeping.  She qualified for home BiPAP which we  are sending with her.    Diabetes A1c on admission was 6.5.  Use sliding scale insulin to assist with sugars.  She needs to see PCP for diabetes management.   Items for Follow Up: Hospital follow-up in 1 week, on home oxygen and BiPAP Suspected COPD, benefit from  PFTs Suspected OSA, benefit from sleep study outpatient Diabetes, needs diabetic management outpatient Follow-up on anxiety, sent home with Atarax and sertraline   Pt has hospital follow up scheduled with [facility] on 03/11/21. She will see [facility] as her PCP.   Significant Procedures: None   Significant Labs and Imaging:  Recent Labs  Lab 03/07/21 0241 03/08/21 0158 03/09/21 0046  WBC 9.4 11.8* 15.3*  HGB 18.2* 18.9* 19.8*  HCT 59.6* 60.2* 59.9*  PLT 152 176 209   Recent Labs  Lab 03/04/21 0303 03/05/21 0044 03/06/21 0132 03/07/21 0241 03/08/21 0158 03/09/21 0046  NA 135 138 133* 137 139 136  K 5.0 4.5 4.3 4.6 4.2 3.7  CL 94* 92* 88* 90* 91* 90*  CO2 36* 39* 38* 41* 39* 35*  GLUCOSE 126* 105* 91 107* 131* 145*  BUN 5* _0 CREATININE 0.59 0.67 0.53 0.72 0.58 0.89  CALCIUM 7.9* 8.7* 8.5* 8.7* 9.5 9.2  ALKPHOS 75 75 69 64 72  --   AST 33 _1 --   ALT 33 34 _2 --   ALBUMIN 3.4* 3.7 3.4* 3.4* 3.6  --     DG Chest Port 1 View  Result Date: 03/07/2021 CLINICAL DATA:  Shortness of breath. EXAM: PORTABLE CHEST 1 VIEW COMPARISON:  March 03, 2021. FINDINGS: Mild cardiomegaly is noted. Both lungs are clear. The visualized skeletal structures are unremarkable. IMPRESSION: No active disease. Electronically Signed   By: Marijo Conception M.D.   On: 03/07/2021 13:17    Echo:  Left Ventricle: Left ventricular ejection fraction, by estimation, is 50  to 55%. The left ventricle has low normal function. The left ventricle has  no regional wall motion abnormalities. The left ventricular internal  cavity size was normal in size.  There is mild left ventricular hypertrophy.   Right Ventricle: The right ventricular size is normal. No increase in  right ventricular wall thickness. Right ventricular systolic function is  normal. Tricuspid regurgitation signal is inadequate for assessing PA  pressure.   Left Atrium: Left atrial size was normal in size.    Right Atrium: Right atrial size was normal in size.   Pericardium: There is no evidence of pericardial effusion.   Mitral Valve: The mitral valve is grossly normal. No evidence of mitral  valve regurgitation.   Tricuspid Valve: The tricuspid valve is grossly normal. Tricuspid valve  regurgitation is not demonstrated.   Aortic Valve: The aortic valve is grossly normal. Aortic valve  regurgitation is not visualized.   Pulmonic Valve: The pulmonic valve was not well visualized.   Aorta: The aortic root and ascending aorta are structurally normal, with  no evidence of dilitation.   Venous: The inferior vena cava is normal in size with greater than 50%  respiratory variability, suggesting right atrial pressure of 3 mmHg.   IAS/Shunts: The atrial septum is grossly normal.   CTA Pulm  IMPRESSION: 1. No evidence of pulmonary embolus. 2. Mild cardiomegaly. 3. Aortic Atherosclerosis (ICD10-I70.0) and Emphysema (ICD10-J43.9).    Discharge Medications:  Allergies as of 03/09/2021   No Known Allergies      Medication List  TAKE these medications    acetaminophen 500 MG tablet Commonly known as: TYLENOL Take 500 mg by mouth every 6 (six) hours as needed for mild pain.   albuterol 108 (90 Base) MCG/ACT inhaler Commonly known as: VENTOLIN HFA Inhale 2 puffs into the lungs every 4 (four) hours as needed for wheezing or shortness of breath.   guaiFENesin 600 MG 12 hr tablet Commonly known as: MUCINEX Take 1 tablet (600 mg total) by mouth 2 (two) times daily.   hydrOXYzine 25 MG tablet Commonly known as: ATARAX Take 1 tablet (25 mg total) by mouth 3 (three) times daily as needed for anxiety.   nicotine 7 mg/24hr patch Commonly known as: NICODERM CQ - dosed in mg/24 hr Place 1 patch (7 mg total) onto the skin daily. Start taking on: March 10, 2021   predniSONE 20 MG tablet Commonly known as: DELTASONE Take 2 tablets (40 mg total) by mouth daily with breakfast for  3 days.   sertraline 25 MG tablet Commonly known as: ZOLOFT Take 1 tablet (25 mg total) by mouth daily. Start taking on: March 10, 2021   Spiriva Respimat 2.5 MCG/ACT Aers Generic drug: Tiotropium Bromide Monohydrate Inhale into the lungs daily.               Durable Medical Equipment  (From admission, onward)           Start     Ordered   03/09/21 1027  For home use only DME oxygen  Once       Question Answer Comment  Length of Need 6 Months   Mode or (Route) Nasal cannula   Liters per Minute 2   Oxygen delivery system Gas      03/09/21 1026            Discharge Instructions: Please refer to Patient Instructions section of EMR for full details.  Patient was counseled important signs and symptoms that should prompt return to medical care, changes in medications, dietary instructions, activity restrictions, and follow up appointments.   Follow-Up Appointments:  Follow-up [facility] Follow up.   Why: TIME:: 9:30 AM DATE :  April 20, 2021 DOCTOR : Lincoln Maxin Contact information: [place] [id number] [place]. Go to.   Specialty: Family Medicine Why: 03/11/21 _0 :50 PM. Please arrive 15 minutes before appt. Contact information: [address] [id number] [place] [facility] Follow up.   Why: oxygen and bipap machine Contact information: [address] [place] [address] [phone]                 Erskine Emery, MD 03/09/2021, 12:20 PM PGY-1, [place]

## 2021-03-09 NOTE — Treatment Plan (Signed)
Occupational Therapy Treatment Patient Details Name: Michele [name] MRN: [id number] DOB: [birth year] Today's Date: 03/09/2021   History of present illness Michele [name] is a 50 year old female presenting with acute respiratory failure secondary to [place].  Patient does not regularly have a PCP, so no documented past medical history. Pt received A1c on this admission that was 6.5.   OT comments  Pt received seated in chair speaking with family/friend on the phone. She reports she is extremely frustrated and just wants to go home. She demonstrates improved processing and ability to anticipate need for safety with mobility and ADLs. Demonstrated fair insight into need for modifications of activities including energy conservation strategies. Reviewed compensatory strategies to increase indep and safety with bathing given pt with tub/shower at home including need to have close family or friend present when she initially attempts as well as to wear O2. Pt with drift down in saturations during mobility to approx 90% but is aware that she needs to minimize talking while moving. She demonstrated indep and appropriate use of pursed lip breathing during recovery. Pt declined offer of equipment including shower chair to improve tolerance of bathing. She said she plans to sit in the tub to minimize exertion. Pt currently not indicated for further OT services. OT to sign off.   Recommendations for follow up therapy are one component of a multi-disciplinary discharge planning process, led by the attending physician.  Recommendations may be updated based on patient status, additional functional criteria and insurance authorization.    Follow Up Recommendations  No OT follow up    Assistance Recommended at Discharge PRN  Equipment Recommendations  Other (comment) (pt declined all equipment, demonstrated mobility consistent with not needing AE for completion of ADLs)    Recommendations for Other Services       Precautions / Restrictions Precautions Precautions: Fall Precaution Comments: watch O2       Mobility Bed Mobility Overal bed mobility: Modified Independent          Transfers Overall transfer level: Independent Equipment used: None Transfers: Sit to/from Stand Sit to Stand: Independent                 Balance Overall balance assessment: No apparent balance deficits (not formally assessed)                         ADL either performed or assessed with clinical judgement   ADL   Eating/Feeding: Independent   Grooming: Wash/dry hands;Brushing hair;Modified independent;Standing;Sitting   Upper Body Bathing:  (reports completing indep, declines completing task)   Lower Body Bathing:  (reports completing indep, declines completing task)   Upper Body Dressing :  (reports completing indep, declines completing task, demonstrated mobility required for indep completion)   Lower Body Dressing: Modified independent   Toilet Transfer: Modified Independent;Ambulation   Toileting- Clothing Manipulation and Hygiene: Modified independent       Functional mobility during ADLs: Modified independent        Cognition Arousal/Alertness: Awake/alert Behavior During Therapy: WFL for tasks assessed/performed Overall Cognitive Status: No family/caregiver present to determine baseline cognitive functioning Area of Impairment: Awareness;Problem solving             Awareness: Anticipatory Problem Solving: Slow processing;Requires verbal cues General Comments: managed lines without assistance, verbalized and demonstrated increased safety awareness                General Comments note skin breakdown under panus which pt attributes to  not being bathed during admission, pt reports applying nystatin powder to area, notified RN    Pertinent Vitals/ Pain           Progress Toward Goals  OT Goals(current goals can now be found in the care plan section)   Progress towards OT goals: Goals met/education completed, patient discharged from OT  Acute Rehab OT Goals Patient Stated Goal: d/c home OT Goal Formulation: With patient Potential to Achieve Goals: Good  Plan Discharge plan remains appropriate;All goals met and education completed, patient discharged from [place] OT "6 Clicks" Daily Activity     Outcome Measure   Help from another person eating meals?: None Help from another person taking care of personal grooming?: None Help from another person toileting, which includes using toliet, bedpan, or urinal?: None Help from another person bathing (including washing, rinsing, drying)?: None Help from another person to put on and taking off regular upper body clothing?: None Help from another person to put on and taking off regular lower body clothing?: None 6 Click Score: 24    End of Session Equipment Utilized During Treatment: Oxygen  OT Visit Diagnosis: Unsteadiness on feet (R26.81);Other abnormalities of gait and mobility (R26.89);Muscle weakness (generalized) (M62.81);Other symptoms and signs involving cognitive function   Activity Tolerance Patient tolerated treatment well;Other (comment) (use of pursed lip breathing to recover following mobility, pt initiated indep)   Patient Left in chair;with call bell/phone within reach   Nurse Communication Mobility status        Time: 3893-7342 OT Time Calculation (min): 23 min  Charges: OT General Charges $OT Visit: 1 Visit OT Treatments $Self Care/Home Management : 23-37 mins    Naomie Dean Kion [place], OTR/L 03/09/2021, 11:59 AM

## 2021-03-09 NOTE — TOC Transition Note (Addendum)
Transition of Care [facility]) - CM/SW Discharge Note   Patient Details  Name: Michele [name] MRN: [id number] Date of Birth: 01/09/1971  [facility]) CM/SW Contact:  Cyndi Bender, RN Phone Number: 03/09/2021, 12:09 PM   Clinical Narrative:    Patient stable for discharge.  Orders for home 02. Patient got approved for NIV but her copay is 50%.She has decided to wait on the NIV until she goes the the doctor on Friday. MD is aware. Patient agreeable to use in house provider for DME needs. Spoke to [place] with [employer] and ordered home 02 and bipap to be delivered to the room prior to discharge.  Patient refuses home health and DME needs. Patient states she has transportation home.   Final next level of care: Home/Self Care Barriers to Discharge: Barriers Resolved   Patient Goals and CMS Choice Patient states their goals for this hospitalization and ongoing recovery are:: To go home CMS Medicare.gov Compare Post Acute Care list provided to:: Patient    Discharge Placement  home                     Discharge Plan and Services   Discharge Planning Services: CM Consult Post Acute Care Choice: Durable Medical Equipment          DME Arranged: Bipap, Oxygen DME Agency: [employer] Date DME Agency Contacted: 03/09/21 Time DME Agency Contacted: [phone] Representative spoke with at DME Agency: [place] ([place]) Interventions     Readmission Risk Interventions No flowsheet data found.

## 2021-03-09 NOTE — Progress Notes (Addendum)
° °  NAME:  Michele [name], MRN:  [id number], DOB:  1970/11/01, LOS: 5 ADMISSION DATE:  03/03/2021, CONSULTATION DATE:  12/18 REFERRING MD:  Oleh Genin, CHIEF COMPLAINT:  Dyspnea   History of Present Illness:  50 y/o smoker admitted with COVID.  Found to have hypercarbic resp failure, emphysema on CT. Has required BIPAP.   Pertinent  Medical History  Cigarette smoker Childhood asthma  Significant Hospital Events: Including procedures, antibiotic start and stop dates in addition to other pertinent events   12/17 PCCM consult 12/19 able to tolerate being off BIPAP for > 3-4 hours  Interim History / Subjective:  Feels a little better Ambulating around the room with no difficulty Tolerating oxygen supplementation  wants to go home She states she is not going back to smoking  Objective   Blood pressure (!) 149/91, pulse 93, temperature 98 F (36.7 C), temperature source Oral, resp. rate 20, height 5\' 2"  (1.575 m), weight 99 kg, last menstrual period 03/20/2009, SpO2 91 %.       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/obstructive sleep apnea Active smoker-states she is not going back to smoking  She does not need discharged on BiPAP but does need a follow-up for further evaluation and management for hypercapnia -Needs evaluation for obesity hypoventilation -Needs to stay off cigarettes -Weight loss  Continue  oxygen supplementation Continue bronchodilators Short-term oral steroids-switched to prednisone 30 mg Wean off oxygen as tolerated Encourage ambulation  Can be considered for discharge on oxygen supplementation  She does need a sleep study to assess for obstructive sleep apnea as outpatient  She is probably stable for discharge on oxygen supplementation  Sherrilyn Rist, MD [place] PCCM Pager: See Shea Evans

## 2021-03-09 NOTE — Progress Notes (Signed)
Physical Therapy Treatment Patient Details Name: Michele [name] MRN: [id number] DOB: 10-11-1970 Today's Date: 03/09/2021   History of Present Illness Michele [name] is a 50 year old female presenting with acute respiratory failure secondary to [place].  Patient does not regularly have a PCP, so no documented past medical history. Pt received A1c on this admission that was 6.5.    PT Comments    Patient able to tolerate 5 minute resting on room air with sats at lowest 89%. Walking sats dropped to 86% on room air and required 2L with sats 90% while walking. Patient eager to go home and encouraged her to continue to mobilize at home, but to continue at a slower pace and with minimal talking (as she did this morning during assessment). RN updated on patient's sats and left on 2L O2.     Recommendations for follow up therapy are one component of a multi-disciplinary discharge planning process, led by the attending physician.  Recommendations may be updated based on patient status, additional functional criteria and insurance authorization.  Follow Up Recommendations  No PT follow up     Assistance Recommended at Discharge None  Equipment Recommendations  None recommended by PT    Recommendations for Other Services       Precautions / Restrictions Precautions Precautions: Fall Precaution Comments: watch O2     Mobility  Bed Mobility Overal bed mobility: Modified Independent                  Transfers Overall transfer level: Independent Equipment used: None Transfers: Sit to/from Stand Sit to Stand: Independent                Ambulation/Gait Ambulation/Gait assistance: Independent Gait Distance (Feet): 200 Feet Assistive device: None Gait Pattern/deviations: Step-through pattern;Decreased stride length Gait velocity: slowed appropriately based on O2 needs     General Gait Details: initially on room air with sats dropping to 86% after 142ft; on 2L stayed at  90% remaining 100 ft   Stairs             Wheelchair Mobility    Modified Rankin (Stroke Patients Only)       Balance Overall balance assessment: No apparent balance deficits (not formally assessed)                                          Cognition Arousal/Alertness: Awake/alert Behavior During Therapy: WFL for tasks assessed/performed Overall Cognitive Status: No family/caregiver present to determine baseline cognitive functioning (reports she is "a bit foggy") Area of Impairment: Awareness;Problem solving                           Awareness: Emergent Problem Solving: Slow processing;Requires verbal cues General Comments: much better awareness of lines during ambulation (no cues needed for managing); reports still a bit "foggy" and slightly delayed        Exercises      General Comments General comments (skin integrity, edema, etc.): Eager to go home today. Did much better job with limiting her talking while mobilizing      Pertinent Vitals/Pain      Home Living                          Prior Function  PT Goals (current goals can now be found in the care plan section) Acute Rehab PT Goals Patient Stated Goal: get stronger and go home PT Goal Formulation: With patient Time For Goal Achievement: 03/21/21 Potential to Achieve Goals: Good Progress towards PT goals: Goals met/education completed, patient discharged from PT    Frequency    Min 3X/week      PT Plan Current plan remains appropriate    Co-evaluation              AM-PAC PT "6 Clicks" Mobility   Outcome Measure  Help needed turning from your back to your side while in a flat bed without using bedrails?: None Help needed moving from lying on your back to sitting on the side of a flat bed without using bedrails?: None Help needed moving to and from a bed to a chair (including a wheelchair)?: None Help needed standing up from a  chair using your arms (e.g., wheelchair or bedside chair)?: None Help needed to walk in hospital room?: None Help needed climbing 3-5 steps with a railing? : None 6 Click Score: 24    End of Session Equipment Utilized During Treatment: Oxygen Activity Tolerance: Patient tolerated treatment well Patient left: in chair;with call bell/phone within reach Nurse Communication: Mobility status;Other (comment) (ambulatory sats) PT Visit Diagnosis: Difficulty in walking, not elsewhere classified (R26.2)     Time: 1239-3594 PT Time Calculation (min) (ACUTE ONLY): 22 min  Charges:  $Gait Training: 8-22 mins                      Arby Barrette, PT Acute Rehabilitation Services  Pager [phone] Office [phone]    Rexanne Mano 03/09/2021, 9:33 AM

## 2021-03-09 NOTE — Progress Notes (Addendum)
Family Medicine Teaching Service Daily Progress Note Intern Pager: [phone]  Patient name: Michele [name] Medical record number: [id number] Date of birth: Jul 23, 1970 Age: 50 y.o. Gender: female  Primary Care Provider: Patient, No Pcp Per (Inactive) Consultants: PCCM  Code Status: Full  Pt Overview and Major Events to Date:  12/15: Admitted to [facility] 12/17: Pulm consulted  Assessment and Plan:  Michele [name] is a 50 year old female presenting with acute hypoxic hypercarbic respiratory failure. PMHx insignificant prior to admission given lack of PCP.   Acute hypoxemic hypercarbic respiratory failure  COVID-19 Patient has been using BiPAP at night.  Remains on 6 L nasal cannula during the day.  Lasix dose given yesterday.  Creatinine remains stable at 0.89. May consider another round of diuresis if deemed appropriate. Patient is adament about going home today. Ambulatory required oxygen is 2L.  -- Pulmonology following, appreciate recs  --Continue BiPAP as needed at night - Goal SPO2 88 to 92% - Transition back to Pred on d/c  - Airborne and contact precautions - Guaifenesin 600 mg 2xdaily  -- s/p Lasix 20 mg IV  -- PT/OT eval and treat   Anxious Mood  Atarax 25 mg 3xdaily and sertraline started at 25 mg daily.  -- Continue Atarax and Sertraline as above   Suspected COPD  Underlying COPD, needs PFTs outpatient.  Patient has long history of tobacco use with hypercarbia and polycythemia present on this admission. - DuoNebs 3 times daily - Spiriva is preferred, will work with pharmacy regarding preauthorization prior to discharge - Goal SPO2 88 to 92%  Intertrigo  Presence in lower abdomen. Can consider topical azole for treatment.   OSA Informal diagnosis, needs sleep study outpatient. - BiPAP nightly and as needed - Outpatient sleep study - Qualifies for home BiPAP  Diabetes CBG 207>218>260.  -CBGs with meals and bedtime --sSSI -No insulin on discharge    FEN/GI:  Regular diet  PPx: Lovenox  Dispo:Home today. Barriers include orders for oxygen and BiPaP at home.   Subjective:  Pt is anxious and agitated regarding her care here. She feels that nursing has not been caring for her as she needs.   Objective: Temp:  [98 F (36.7 C)-98.8 F (37.1 C)] 98 F (36.7 C) (12/21 0743) Pulse Rate:  [77-93] 93 (12/21 0743) Resp:  [18-20] 20 (12/21 0743) BP: (124-149)/(75-91) 149/91 (12/21 0743) SpO2:  [89 %-92 %] 91 % (12/21 0743) Physical Exam: General: Not ill-appearing  Cardiovascular: RRR Respiratory: Expiratory wheezing with good air movement  Abdomen: nontender, intertrigo present over lower abdomen  Extremities: FROM   Laboratory: Recent Labs  Lab 03/07/21 0241 03/08/21 0158 03/09/21 0046  WBC 9.4 11.8* 15.3*  HGB 18.2* 18.9* 19.8*  HCT 59.6* 60.2* 59.9*  PLT 152 176 209   Recent Labs  Lab 03/06/21 0132 03/07/21 0241 03/08/21 0158 03/09/21 0046  NA 133* 137 139 136  K 4.3 4.6 4.2 3.7  CL 88* 90* 91* 90*  CO2 38* 41* 39* 35*  BUN 11 9 12 17   CREATININE 0.53 0.72 0.58 0.89  CALCIUM 8.5* 8.7* 9.5 9.2  PROT 6.6 6.4* 7.2  --   BILITOT 0.7 0.9 0.9  --   ALKPHOS 69 64 72  --   ALT 30 26 24   --   AST 20 15 15   --   GLUCOSE 91 107* 131* 145*      Erskine Emery, MD 03/09/2021, 11:57 AM PGY-1, [place] Intern pager: [phone], text pages welcome

## 2021-03-09 NOTE — Progress Notes (Signed)
SATURATION QUALIFICATIONS: (This note is used to comply with regulatory documentation for home oxygen)  Patient Saturations on Room Air at Rest = 89%  Patient Saturations on Room Air while Ambulating = 86%  Patient Saturations on 2 Liters of oxygen while Ambulating = 90%  Please briefly explain why patient needs home oxygen: To maintain sats >87% during functional mobility.   Arby Barrette, PT Acute Rehabilitation Services  Pager [phone] Office [phone]

## 2021-03-09 NOTE — Progress Notes (Signed)
Chaplain introduced spiritual care and offered support during hospitalization. Pt shared that she is trying to conserve her oxygen as she is preparing to be discharged and will be on a lower volume than she is receiving inpatient. Chaplain asked open ended questions to facilitate emotional expression. Michele [name] shared that she asked for prayer days ago and did not receive a visit. Chaplain offered apologies and affirmed pt's frustration. Michele [name] shared other ways she feels that her needs were not met while she was in the hospital, including a several hour delay to get reading glasses a friend had dropped off, which escalated fears of her bills not being paid and going home to a cold home. Michele [name] shared that she might want to talk more at another time, but she is trying to focus on conserving her energy for discharge. Chaplain asked if she would still like prayer and she said, "always." Michele [name] also shared that she feels blessed because she thinks she could have died. Her birthday is later this week and she knows this is a season of miracles, but still life is hard right now. Chaplain offered listening presence and affirmed the duality of her situation. Chaplain offered prayer aligned with pt's religious tradition. ° °Please page as further needs arise. ° °Amanda M. Davee Lomax, M.Div. [facility] °Chaplain °Pager [phone] °Office [phone] ° ° ° ° ° 03/09/21 1600  °Clinical Encounter Type  °Visited With Patient  °Visit Type Initial;Psychological support;Spiritual support  °Referral From Nurse  °Spiritual Encounters  °Spiritual Needs Emotional;Grief support;Prayer  °Stress Factors  °Patient Stress Factors Loss of control;Health changes  ° ° °

## 2021-03-11 ENCOUNTER — Encounter: Payer: Self-pay | Admitting: Family Medicine

## 2021-03-11 ENCOUNTER — Ambulatory Visit (INDEPENDENT_AMBULATORY_CARE_PROVIDER_SITE_OTHER): Payer: 59 | Admitting: Family Medicine

## 2021-03-11 ENCOUNTER — Telehealth (INDEPENDENT_AMBULATORY_CARE_PROVIDER_SITE_OTHER): Payer: 59 | Admitting: Family Medicine

## 2021-03-11 ENCOUNTER — Other Ambulatory Visit: Payer: Self-pay

## 2021-03-11 VITALS — BP 138/78 | HR 95 | Wt 221.0 lb

## 2021-03-11 DIAGNOSIS — U071 COVID-19: Secondary | ICD-10-CM

## 2021-03-11 DIAGNOSIS — J9600 Acute respiratory failure, unspecified whether with hypoxia or hypercapnia: Secondary | ICD-10-CM

## 2021-03-11 DIAGNOSIS — Z720 Tobacco use: Secondary | ICD-10-CM | POA: Diagnosis not present

## 2021-03-11 DIAGNOSIS — G4733 Obstructive sleep apnea (adult) (pediatric): Secondary | ICD-10-CM

## 2021-03-11 NOTE — Patient Instructions (Addendum)
It was wonderful to see you today.  Please bring ALL of your medications with you to every visit.   Today we talked about:   --Getting blood work--I will call you next week with results  --You can use your oxygen as needed  Call the oxygen company on Monday  You should still use the oxygen at night until you the BiPap machine  Address: [address] [place], [place] [address]  Phone: [phone]    Thank you for choosing [facility].   Please call [phone] with any questions about today's appointment.  Please be sure to schedule follow up at the front  desk before you leave today.   Dorris Singh, MD  Family Medicine

## 2021-03-11 NOTE — Progress Notes (Signed)
Attempted to call patient about rescheduling visit and conducting virtual visit.   Addendum: patient would like to come in to have O2 values checked. Scheduled for in person. No charge this visit.   Dorris Singh, MD  Family Medicine Teaching Service

## 2021-03-11 NOTE — Assessment & Plan Note (Signed)
This is improving and nearly resolved.  Maintain oxygen saturation above goal while ambulating today.  We discussed the use of oxygen at night if she does not have BiPAP at home.  Reviewed return precautions.  Will obtain repeat labs today.  Instructed her on follow-up.  Referral placed to pulmonary medicine for suspected COPD as well as for suspected obstructive sleep apnea versus obesity hypoventilation syndrome.

## 2021-03-11 NOTE — Progress Notes (Signed)
° ° °  SUBJECTIVE:   CHIEF COMPLAINT: breathing follow up from hospital  HPI:   Michele [name] is a 50 y.o.  with history notable for recent COVID19, suspected COPD, and suspected obstructive sleep apnea [place] presenting for hospital follow-up.  She reports overall she is doing well.  She denies dyspnea or chest pain.  She continues to have a cough.  She is working very hard on her breathing exercises.  She is " upset that the oxygen was not turned on when she left the hospital."  Per patient oxygen was placed on her and she was given the portable oxygen container but the liters per minute her flow rate was never turned on.  She was also not given instruction as she reports.  She does not have the instruction book with her today.  She feels overall better today. She has been using all of her medications as prescribed. No concerns today.   PERTINENT  PMH / PSH/Family/Social History : Medical history reviewed.  She does not her primary care physician.  Discharge summary was reviewed.  OBJECTIVE:   Today's Vitals   03/11/21 1026  BP: 138/78  Pulse: 95  SpO2: 94%  Weight: 221 lb (100.2 kg)  PainSc: 0-No pain   Body mass index is 40.42 kg/m. Room air vitals  The patient was 92% on ambient air without oxygen.  The patient walked all throughout the clinic and maintained oxygen saturations greater than 88%.  Cardiac: Regular rate and rhythm. Normal S1/S2. No murmurs, rubs, or gallops appreciated. Lungs: Clear bilaterally to ascultation.  Psych: Pleasant and appropriate  Anxious but appropriate   ASSESSMENT/PLAN:   Acute respiratory failure due to COVID-19 [facility]) This is improving and nearly resolved.  Maintain oxygen saturation above goal while ambulating today.  We discussed the use of oxygen at night if she does not have BiPAP at home.  Reviewed return precautions.  Will obtain repeat labs today.  Instructed her on follow-up.  Referral placed to pulmonary medicine for suspected COPD as  well as for suspected obstructive sleep apnea versus obesity hypoventilation syndrome.    Anxiety, question of some component of hypomania.  Would monitor closely on SSRI going forward.  Would benefit from therapy as well as potential medication adjustment in the future.  She reports she is doing well with sertraline and has no concerns.  Patient will follow up with [facility] who will assume care as PCP.     Dorris Singh, [place]

## 2021-03-12 LAB — COMPREHENSIVE METABOLIC PANEL
ALT: 35 IU/L — ABNORMAL HIGH (ref 0–32)
AST: 18 IU/L (ref 0–40)
Albumin/Globulin Ratio: 1.7 (ref 1.2–2.2)
Albumin: 4.6 g/dL (ref 3.8–4.8)
Alkaline Phosphatase: 84 IU/L (ref 44–121)
BUN/Creatinine Ratio: 19 (ref 9–23)
BUN: 14 mg/dL (ref 6–24)
Bilirubin Total: 0.8 mg/dL (ref 0.0–1.2)
CO2: 25 mmol/L (ref 20–29)
Calcium: 10.3 mg/dL — ABNORMAL HIGH (ref 8.7–10.2)
Chloride: 90 mmol/L — ABNORMAL LOW (ref 96–106)
Creatinine, Ser: 0.73 mg/dL (ref 0.57–1.00)
Globulin, Total: 2.7 g/dL (ref 1.5–4.5)
Glucose: 147 mg/dL — ABNORMAL HIGH (ref 70–99)
Potassium: 4.4 mmol/L (ref 3.5–5.2)
Sodium: 136 mmol/L (ref 134–144)
Total Protein: 7.3 g/dL (ref 6.0–8.5)
eGFR: 100 mL/min/{1.73_m2} (ref >59)

## 2021-03-12 LAB — CBC
Hematocrit: 57.3 % — ABNORMAL HIGH (ref 34.0–46.6)
Hemoglobin: 20 g/dL (ref 11.1–15.9)
MCH: 31.8 pg (ref 26.6–33.0)
MCHC: 34.9 g/dL (ref 31.5–35.7)
MCV: 91 fL (ref 79–97)
Platelets: 247 10*3/uL (ref 150–450)
RBC: 6.29 x10E6/uL — ABNORMAL HIGH (ref 3.77–5.28)
RDW: 12.2 % (ref 11.7–15.4)
WBC: 11.9 10*3/uL — ABNORMAL HIGH (ref 3.4–10.8)

## 2021-03-14 ENCOUNTER — Telehealth: Payer: Self-pay | Admitting: Family Medicine

## 2021-03-14 NOTE — Telephone Encounter (Signed)
Called patient with results. Recommend repeat hepatic panel and CBC at follow up.   Dorris Singh, MD  Family Medicine Teaching Service

## 2021-04-07 ENCOUNTER — Ambulatory Visit: Payer: Self-pay

## 2021-04-14 ENCOUNTER — Other Ambulatory Visit: Payer: Self-pay

## 2021-04-14 ENCOUNTER — Ambulatory Visit: Payer: Self-pay | Admitting: *Deleted

## 2021-04-14 ENCOUNTER — Ambulatory Visit: Admission: RE | Admit: 2021-04-14 | Payer: Self-pay | Source: Ambulatory Visit

## 2021-04-14 VITALS — BP 118/74 | Wt 222.6 lb

## 2021-04-14 DIAGNOSIS — Z1211 Encounter for screening for malignant neoplasm of colon: Secondary | ICD-10-CM

## 2021-04-14 NOTE — Progress Notes (Signed)
Ms. Michele [name] is a 51 y.o. [phone] female who presents to [facility] clinic today with no complaints. Patient has [employer] Insurance that was effective 03/20/2021. Patient called to verify and insurance with cover screening mammogram and Pap smear. Let patient know not eligible for [facility]CP since she has insurance and screenings are covered. Patient verbalized understanding.     Loletta Parish, RN 04/14/2021 9:51 AM

## 2021-04-18 ENCOUNTER — Encounter: Payer: Medicaid Other | Admitting: Student

## 2021-04-20 ENCOUNTER — Ambulatory Visit: Payer: Self-pay | Attending: Family Medicine | Admitting: Family Medicine

## 2021-04-22 ENCOUNTER — Telehealth: Payer: Self-pay | Admitting: Pulmonary Disease

## 2021-04-22 NOTE — Telephone Encounter (Signed)
I have called to re-schedule this patient for another day. I left a message for patient to call back.

## 2021-04-25 ENCOUNTER — Institutional Professional Consult (permissible substitution): Payer: Medicaid Other | Admitting: Pulmonary Disease

## 2023-04-06 ENCOUNTER — Emergency Department (HOSPITAL_COMMUNITY): Payer: 59

## 2023-04-06 ENCOUNTER — Other Ambulatory Visit: Payer: Self-pay

## 2023-04-06 ENCOUNTER — Encounter (HOSPITAL_COMMUNITY): Payer: Self-pay

## 2023-04-06 ENCOUNTER — Inpatient Hospital Stay (HOSPITAL_COMMUNITY)
Admission: EM | Admit: 2023-04-06 | Discharge: 2023-04-19 | DRG: 207 | Disposition: A | Discharge status: Home / Self Care | Payer: 59 | Attending: Internal Medicine | Admitting: Internal Medicine

## 2023-04-06 DIAGNOSIS — F32A Depression, unspecified: Secondary | ICD-10-CM | POA: Diagnosis present

## 2023-04-06 DIAGNOSIS — Z8049 Family history of malignant neoplasm of other genital organs: Secondary | ICD-10-CM

## 2023-04-06 DIAGNOSIS — E1165 Type 2 diabetes mellitus with hyperglycemia: Secondary | ICD-10-CM | POA: Diagnosis present

## 2023-04-06 DIAGNOSIS — J1569 Pneumonia due to other gram-negative bacteria: Secondary | ICD-10-CM | POA: Diagnosis not present

## 2023-04-06 DIAGNOSIS — J189 Pneumonia, unspecified organism: Secondary | ICD-10-CM | POA: Diagnosis not present

## 2023-04-06 DIAGNOSIS — E871 Hypo-osmolality and hyponatremia: Secondary | ICD-10-CM | POA: Diagnosis present

## 2023-04-06 DIAGNOSIS — I10 Essential (primary) hypertension: Secondary | ICD-10-CM | POA: Diagnosis present

## 2023-04-06 DIAGNOSIS — Z794 Long term (current) use of insulin: Secondary | ICD-10-CM | POA: Diagnosis not present

## 2023-04-06 DIAGNOSIS — J14 Pneumonia due to Hemophilus influenzae: Secondary | ICD-10-CM | POA: Diagnosis present

## 2023-04-06 DIAGNOSIS — J1001 Influenza due to other identified influenza virus with the same other identified influenza virus pneumonia: Secondary | ICD-10-CM | POA: Diagnosis present

## 2023-04-06 DIAGNOSIS — D751 Secondary polycythemia: Secondary | ICD-10-CM | POA: Diagnosis present

## 2023-04-06 DIAGNOSIS — T380X5A Adverse effect of glucocorticoids and synthetic analogues, initial encounter: Secondary | ICD-10-CM | POA: Diagnosis not present

## 2023-04-06 DIAGNOSIS — Z6841 Body Mass Index (BMI) 40.0 and over, adult: Secondary | ICD-10-CM | POA: Diagnosis not present

## 2023-04-06 DIAGNOSIS — F410 Panic disorder [episodic paroxysmal anxiety] without agoraphobia: Secondary | ICD-10-CM | POA: Diagnosis present

## 2023-04-06 DIAGNOSIS — J9621 Acute and chronic respiratory failure with hypoxia: Principal | ICD-10-CM | POA: Diagnosis present

## 2023-04-06 DIAGNOSIS — E66811 Obesity, class 1: Secondary | ICD-10-CM | POA: Diagnosis present

## 2023-04-06 DIAGNOSIS — J441 Chronic obstructive pulmonary disease with (acute) exacerbation: Secondary | ICD-10-CM | POA: Diagnosis not present

## 2023-04-06 DIAGNOSIS — J9601 Acute respiratory failure with hypoxia: Principal | ICD-10-CM

## 2023-04-06 DIAGNOSIS — J440 Chronic obstructive pulmonary disease with acute lower respiratory infection: Secondary | ICD-10-CM | POA: Diagnosis present

## 2023-04-06 DIAGNOSIS — N179 Acute kidney failure, unspecified: Secondary | ICD-10-CM | POA: Diagnosis not present

## 2023-04-06 DIAGNOSIS — Z90721 Acquired absence of ovaries, unilateral: Secondary | ICD-10-CM

## 2023-04-06 DIAGNOSIS — J09X1 Influenza due to identified novel influenza A virus with pneumonia: Secondary | ICD-10-CM | POA: Diagnosis not present

## 2023-04-06 DIAGNOSIS — F17210 Nicotine dependence, cigarettes, uncomplicated: Secondary | ICD-10-CM | POA: Diagnosis present

## 2023-04-06 DIAGNOSIS — G4733 Obstructive sleep apnea (adult) (pediatric): Secondary | ICD-10-CM | POA: Diagnosis present

## 2023-04-06 DIAGNOSIS — R34 Anuria and oliguria: Secondary | ICD-10-CM | POA: Diagnosis present

## 2023-04-06 DIAGNOSIS — R7303 Prediabetes: Secondary | ICD-10-CM | POA: Diagnosis not present

## 2023-04-06 DIAGNOSIS — E8729 Other acidosis: Secondary | ICD-10-CM | POA: Diagnosis not present

## 2023-04-06 DIAGNOSIS — Z79899 Other long term (current) drug therapy: Secondary | ICD-10-CM

## 2023-04-06 DIAGNOSIS — J9622 Acute and chronic respiratory failure with hypercapnia: Secondary | ICD-10-CM | POA: Diagnosis present

## 2023-04-06 DIAGNOSIS — J9602 Acute respiratory failure with hypercapnia: Secondary | ICD-10-CM | POA: Diagnosis not present

## 2023-04-06 DIAGNOSIS — I959 Hypotension, unspecified: Secondary | ICD-10-CM | POA: Diagnosis not present

## 2023-04-06 DIAGNOSIS — Z809 Family history of malignant neoplasm, unspecified: Secondary | ICD-10-CM

## 2023-04-06 HISTORY — DX: Chronic obstructive pulmonary disease, unspecified: J44.9

## 2023-04-06 LAB — BASIC METABOLIC PANEL
Anion gap: 12 (ref 5–15)
BUN: 13 mg/dL (ref 6–20)
CO2: 32 mmol/L (ref 22–32)
Calcium: 8.3 mg/dL — ABNORMAL LOW (ref 8.9–10.3)
Chloride: 87 mmol/L — ABNORMAL LOW (ref 98–111)
Creatinine, Ser: 0.89 mg/dL (ref 0.44–1.00)
GFR, Estimated: >60 mL/min (ref >60)
Glucose, Bld: 216 mg/dL — ABNORMAL HIGH (ref 70–99)
Potassium: 4.3 mmol/L (ref 3.5–5.1)
Sodium: 131 mmol/L — ABNORMAL LOW (ref 135–145)

## 2023-04-06 LAB — GLUCOSE, CAPILLARY
Glucose-Capillary: 154 mg/dL — ABNORMAL HIGH (ref 70–99)
Glucose-Capillary: 172 mg/dL — ABNORMAL HIGH (ref 70–99)
Glucose-Capillary: 182 mg/dL — ABNORMAL HIGH (ref 70–99)

## 2023-04-06 LAB — CBC
HCT: 54 % — ABNORMAL HIGH (ref 36.0–46.0)
Hemoglobin: 17.9 g/dL — ABNORMAL HIGH (ref 12.0–15.0)
MCH: 31.6 pg (ref 26.0–34.0)
MCHC: 33.1 g/dL (ref 30.0–36.0)
MCV: 95.2 fL (ref 80.0–100.0)
Platelets: 270 10*3/uL (ref 150–400)
RBC: 5.67 MIL/uL — ABNORMAL HIGH (ref 3.87–5.11)
RDW: 13.8 % (ref 11.5–15.5)
WBC: 13.5 10*3/uL — ABNORMAL HIGH (ref 4.0–10.5)
nRBC: 0 % (ref 0.0–0.2)

## 2023-04-06 LAB — POCT I-STAT 7, (LYTES, BLD GAS, ICA,H+H)
Acid-Base Excess: 4 mmol/L — ABNORMAL HIGH (ref 0.0–2.0)
Bicarbonate: 35.9 mmol/L — ABNORMAL HIGH (ref 20.0–28.0)
Calcium, Ion: 1.11 mmol/L — ABNORMAL LOW (ref 1.15–1.40)
HCT: 53 % — ABNORMAL HIGH (ref 36.0–46.0)
Hemoglobin: 18 g/dL — ABNORMAL HIGH (ref 12.0–15.0)
O2 Saturation: 94 %
Patient temperature: 36.7
Potassium: 4.4 mmol/L (ref 3.5–5.1)
Sodium: 132 mmol/L — ABNORMAL LOW (ref 135–145)
TCO2: 39 mmol/L — ABNORMAL HIGH (ref 22–32)
pCO2 arterial: 86.9 mm[Hg] (ref 32–48)
pH, Arterial: 7.222 — ABNORMAL LOW (ref 7.35–7.45)
pO2, Arterial: 86 mm[Hg] (ref 83–108)

## 2023-04-06 LAB — I-STAT ARTERIAL BLOOD GAS, ED
Acid-Base Excess: 2 mmol/L (ref 0.0–2.0)
Bicarbonate: 36.1 mmol/L — ABNORMAL HIGH (ref 20.0–28.0)
Calcium, Ion: 1.13 mmol/L — ABNORMAL LOW (ref 1.15–1.40)
HCT: 52 % — ABNORMAL HIGH (ref 36.0–46.0)
Hemoglobin: 17.7 g/dL — ABNORMAL HIGH (ref 12.0–15.0)
O2 Saturation: 100 %
Patient temperature: 97.4
Potassium: 3.9 mmol/L (ref 3.5–5.1)
Sodium: 131 mmol/L — ABNORMAL LOW (ref 135–145)
TCO2: 39 mmol/L — ABNORMAL HIGH (ref 22–32)
pCO2 arterial: 101.5 mm[Hg] (ref 32–48)
pH, Arterial: 7.156 — CL (ref 7.35–7.45)
pO2, Arterial: 563 mm[Hg] — ABNORMAL HIGH (ref 83–108)

## 2023-04-06 LAB — TROPONIN I (HIGH SENSITIVITY)
Troponin I (High Sensitivity): 52 ng/L — ABNORMAL HIGH (ref <18)
Troponin I (High Sensitivity): 73 ng/L — ABNORMAL HIGH (ref <18)

## 2023-04-06 LAB — RESP PANEL BY RT-PCR (RSV, FLU A&B, COVID)  RVPGX2
Influenza A by PCR: POSITIVE — AB
Influenza B by PCR: NEGATIVE
Resp Syncytial Virus by PCR: NEGATIVE
SARS Coronavirus 2 by RT PCR: NEGATIVE

## 2023-04-06 LAB — BLOOD GAS, ARTERIAL
Acid-Base Excess: 2.9 mmol/L — ABNORMAL HIGH (ref 0.0–2.0)
Bicarbonate: 36.2 mmol/L — ABNORMAL HIGH (ref 20.0–28.0)
Drawn by: 42783
O2 Saturation: 100 %
Patient temperature: 36.3
pCO2 arterial: 111 mm[Hg] (ref 32–48)
pH, Arterial: 7.12 — CL (ref 7.35–7.45)
pO2, Arterial: 345 mm[Hg] — ABNORMAL HIGH (ref 83–108)

## 2023-04-06 LAB — CREATININE, SERUM
Creatinine, Ser: 0.98 mg/dL (ref 0.44–1.00)
GFR, Estimated: >60 mL/min (ref >60)

## 2023-04-06 LAB — HIV ANTIBODY (ROUTINE TESTING W REFLEX): HIV Screen 4th Generation wRfx: NONREACTIVE

## 2023-04-06 LAB — MRSA NEXT GEN BY PCR, NASAL: MRSA by PCR Next Gen: NOT DETECTED

## 2023-04-06 MED ORDER — METHYLPREDNISOLONE SODIUM SUCC 125 MG IJ SOLR: 125.0000 mg | Freq: Once | INTRAMUSCULAR | Status: DC

## 2023-04-06 MED ORDER — ALBUTEROL SULFATE (2.5 MG/3ML) 0.083% IN NEBU
INHALATION_SOLUTION | RESPIRATORY_TRACT | Status: AC
  Filled 2023-04-06: qty 12

## 2023-04-06 MED ORDER — MIDAZOLAM HCL 2 MG/2ML IJ SOLN
2.0000 mg | INTRAMUSCULAR | Status: DC | PRN
  Administered 2023-04-08 – 2023-04-09 (×4): 2 mg via INTRAVENOUS
  Filled 2023-04-06 (×4): qty 2

## 2023-04-06 MED ORDER — FENTANYL BOLUS VIA INFUSION: 50.0000 ug | INTRAVENOUS | Status: DC | PRN

## 2023-04-06 MED ORDER — ALBUTEROL SULFATE (2.5 MG/3ML) 0.083% IN NEBU
10.0000 mg/h | INHALATION_SOLUTION | Freq: Once | RESPIRATORY_TRACT | Status: AC
  Administered 2023-04-07: 10 mg/h via RESPIRATORY_TRACT
  Filled 2023-04-06: qty 12

## 2023-04-06 MED ORDER — SODIUM CHLORIDE 0.9 % IV BOLUS
1000.0000 mL | Freq: Once | INTRAVENOUS | Status: AC
  Administered 2023-04-06: 1000 mL via INTRAVENOUS

## 2023-04-06 MED ORDER — DOCUSATE SODIUM 100 MG PO CAPS: 100.0000 mg | ORAL_CAPSULE | Freq: Two times a day (BID) | ORAL | Status: DC | PRN

## 2023-04-06 MED ORDER — ORAL CARE MOUTH RINSE
15.0000 mL | OROMUCOSAL | Status: DC
Start: 2023-04-06 — End: 2023-04-11
  Administered 2023-04-06 – 2023-04-11 (×57): 15 mL via OROMUCOSAL

## 2023-04-06 MED ORDER — OSELTAMIVIR PHOSPHATE 6 MG/ML PO SUSR
75.0000 mg | Freq: Two times a day (BID) | ORAL | Status: AC
  Administered 2023-04-06 – 2023-04-11 (×10): 75 mg via ORAL
  Filled 2023-04-06 (×11): qty 12.5

## 2023-04-06 MED ORDER — PIVOT 1.5 CAL PO LIQD
1000.0000 mL | ORAL | Status: DC
Start: 2023-04-06 — End: 2023-04-07
  Administered 2023-04-06: 1000 mL
  Filled 2023-04-06: qty 1000

## 2023-04-06 MED ORDER — ETOMIDATE 2 MG/ML IV SOLN
20.0000 mg | Freq: Once | INTRAVENOUS | Status: AC
Start: 2023-04-06 — End: 2023-04-06
  Administered 2023-04-06: 20 mg via INTRAVENOUS

## 2023-04-06 MED ORDER — SUCCINYLCHOLINE CHLORIDE 200 MG/10ML IV SOSY
150.0000 mg | PREFILLED_SYRINGE | Freq: Once | INTRAVENOUS | Status: AC
  Administered 2023-04-06: 150 mg via INTRAVENOUS

## 2023-04-06 MED ORDER — FENTANYL BOLUS VIA INFUSION
50.0000 ug | INTRAVENOUS | Status: DC | PRN
  Administered 2023-04-06 (×2): 100 ug via INTRAVENOUS
  Administered 2023-04-07: 50 ug via INTRAVENOUS
  Administered 2023-04-08 – 2023-04-10 (×10): 100 ug via INTRAVENOUS

## 2023-04-06 MED ORDER — CHLORHEXIDINE GLUCONATE CLOTH 2 % EX PADS
6.0000 | MEDICATED_PAD | Freq: Every day | CUTANEOUS | Status: DC
  Administered 2023-04-06 – 2023-04-15 (×10): 6 via TOPICAL

## 2023-04-06 MED ORDER — POLYETHYLENE GLYCOL 3350 17 G PO PACK: 17.0000 g | PACK | Freq: Every day | ORAL | Status: DC | PRN

## 2023-04-06 MED ORDER — METHYLPREDNISOLONE SODIUM SUCC 125 MG IJ SOLR: 80.0000 mg | Freq: Two times a day (BID) | INTRAMUSCULAR | Status: DC

## 2023-04-06 MED ORDER — SODIUM CHLORIDE 0.9 % IV SOLN
250.0000 mL | INTRAVENOUS | Status: AC
  Administered 2023-04-06: 250 mL via INTRAVENOUS

## 2023-04-06 MED ORDER — PROSOURCE TF20 ENFIT COMPATIBL EN LIQD
60.0000 mL | Freq: Every day | ENTERAL | Status: DC
  Administered 2023-04-06 – 2023-04-11 (×6): 60 mL
  Filled 2023-04-06 (×6): qty 60

## 2023-04-06 MED ORDER — MIDAZOLAM HCL 2 MG/2ML IJ SOLN: 1.0000 mg | INTRAMUSCULAR | Status: DC | PRN

## 2023-04-06 MED ORDER — METHYLPREDNISOLONE SODIUM SUCC 125 MG IJ SOLR
80.0000 mg | Freq: Two times a day (BID) | INTRAMUSCULAR | Status: DC
  Administered 2023-04-06 – 2023-04-07 (×2): 80 mg via INTRAVENOUS
  Filled 2023-04-06 (×2): qty 2

## 2023-04-06 MED ORDER — ALBUTEROL SULFATE (2.5 MG/3ML) 0.083% IN NEBU
2.5000 mg | INHALATION_SOLUTION | RESPIRATORY_TRACT | Status: DC | PRN
  Administered 2023-04-07 – 2023-04-12 (×8): 2.5 mg via RESPIRATORY_TRACT
  Filled 2023-04-06 (×10): qty 3

## 2023-04-06 MED ORDER — FENTANYL CITRATE PF 50 MCG/ML IJ SOSY
50.0000 ug | PREFILLED_SYRINGE | Freq: Once | INTRAMUSCULAR | Status: DC
Start: 2023-04-06 — End: 2023-04-11

## 2023-04-06 MED ORDER — FENTANYL 2500MCG IN NS 250ML (10MCG/ML) PREMIX INFUSION
50.0000 ug/h | INTRAVENOUS | Status: DC
Start: 2023-04-06 — End: 2023-04-11
  Administered 2023-04-06: 50 ug/h via INTRAVENOUS
  Administered 2023-04-06 – 2023-04-07 (×3): 200 ug/h via INTRAVENOUS
  Administered 2023-04-08: 100 ug/h via INTRAVENOUS
  Administered 2023-04-09: 50 ug/h via INTRAVENOUS
  Administered 2023-04-09: 100 ug/h via INTRAVENOUS
  Administered 2023-04-10: 150 ug/h via INTRAVENOUS
  Administered 2023-04-10: 200 ug/h via INTRAVENOUS
  Filled 2023-04-06 (×7): qty 250

## 2023-04-06 MED ORDER — PROPOFOL 1000 MG/100ML IV EMUL
0.0000 ug/kg/min | INTRAVENOUS | Status: DC
Start: 2023-04-06 — End: 2023-04-06
  Administered 2023-04-06: 5 ug/kg/min via INTRAVENOUS
  Filled 2023-04-06 (×3): qty 100

## 2023-04-06 MED ORDER — ENOXAPARIN SODIUM 40 MG/0.4ML IJ SOSY
40.0000 mg | PREFILLED_SYRINGE | INTRAMUSCULAR | Status: DC
  Administered 2023-04-06 – 2023-04-18 (×13): 40 mg via SUBCUTANEOUS
  Filled 2023-04-06 (×13): qty 0.4

## 2023-04-06 MED ORDER — PROPOFOL 1000 MG/100ML IV EMUL
0.0000 ug/kg/min | INTRAVENOUS | Status: DC
  Administered 2023-04-06: 20 ug/kg/min via INTRAVENOUS
  Administered 2023-04-07 (×3): 40 ug/kg/min via INTRAVENOUS
  Administered 2023-04-07: 30 ug/kg/min via INTRAVENOUS
  Administered 2023-04-07: 40 ug/kg/min via INTRAVENOUS
  Administered 2023-04-07 – 2023-04-08 (×4): 50 ug/kg/min via INTRAVENOUS
  Administered 2023-04-08: 30 ug/kg/min via INTRAVENOUS
  Administered 2023-04-08: 20 ug/kg/min via INTRAVENOUS
  Administered 2023-04-09 (×3): 50 ug/kg/min via INTRAVENOUS
  Filled 2023-04-06 (×15): qty 100

## 2023-04-06 MED ORDER — IPRATROPIUM-ALBUTEROL 0.5-2.5 (3) MG/3ML IN SOLN
3.0000 mL | Freq: Four times a day (QID) | RESPIRATORY_TRACT | Status: DC
  Administered 2023-04-06 – 2023-04-07 (×3): 3 mL via RESPIRATORY_TRACT
  Filled 2023-04-06 (×3): qty 3

## 2023-04-06 MED ORDER — FAMOTIDINE 20 MG PO TABS
20.0000 mg | ORAL_TABLET | Freq: Two times a day (BID) | ORAL | Status: DC
  Administered 2023-04-06 – 2023-04-11 (×11): 20 mg
  Filled 2023-04-06 (×11): qty 1

## 2023-04-06 MED ORDER — NOREPINEPHRINE 4 MG/250ML-% IV SOLN
2.0000 ug/min | INTRAVENOUS | Status: DC
  Administered 2023-04-06: 2 ug/min via INTRAVENOUS
  Administered 2023-04-07: 6 ug/min via INTRAVENOUS
  Filled 2023-04-06 (×2): qty 250

## 2023-04-06 MED ORDER — ORAL CARE MOUTH RINSE
15.0000 mL | OROMUCOSAL | Status: DC | PRN
Start: 2023-04-06 — End: 2023-04-11

## 2023-04-06 NOTE — ED Notes (Addendum)
Pt being intubated. 

## 2023-04-06 NOTE — Progress Notes (Signed)
Pt arrived from ED. Vitals stable. Pt became agitated pulling on lines and tubes. Fent and prop increased per MD order. Pt now resting comfortably. Patient belongings at bedside: 3 rings, purse, clothes, slippers.

## 2023-04-06 NOTE — Progress Notes (Signed)
eLink Physician-Brief Progress Note Patient Name: Michele [name] DOB: 19-Dec-1970 MRN: [id number]   Date of Service  04/06/2023  HPI/Events of Note  Agitation  eICU Interventions      Adjust doses of Fent and propofol For RASS -1 to 0  Addendum Remains in resp acidemia  Will change TV to 450    Addendum Peak pr increased with tV increase  We will  Obtain CXR Give neb now Increase frequency of neb to q4 ABG at 5   Addendum Improving Acid base Oliguric  We will give 1l BOLUS Cxr : NO chf Bnp HAS BEEN nl  Massie Maroon 04/06/2023, 9:31 PM

## 2023-04-06 NOTE — ED Notes (Signed)
Pt OG can be hooked back up to suction at [employer]

## 2023-04-06 NOTE — ED Notes (Signed)
Pt BP 77/55 - RN [facility] Respiratory at bedside, Alarm silenced.

## 2023-04-06 NOTE — Progress Notes (Signed)
Transported pt from ED32 to 3M09 with bedside RN. Vitals are stable.

## 2023-04-06 NOTE — ED Triage Notes (Signed)
Pt arrived via [facility] from home in respiratory distress. Pt c/o SOBx2d. Per EMS, pt was able to say a few word sentences at home, but when they got her in truck her breathing got worse and became restless and was only able to say one word sentences. Per EMS, pt has diminished lunges and wheezes in bases. EMS gave solumedrol 125 mg, duoneb and magnesium 2 g IV. Pt is tachypneic and has labored breathing. RT is at bedside and placed pt on bipap.

## 2023-04-06 NOTE — ED Provider Notes (Signed)
[place] EMERGENCY DEPARTMENT AT [facility] Provider Note   CSN: [id number] Arrival date & time: 04/06/23  1202     History  Chief Complaint  Patient presents with   Respiratory Distress    Michele [name] is a 53 y.o. female with medical history of COPD, tobacco use, OSA on BiPAP, diabetes.  Patient presents to the ED for evaluation.  Patient arrives in respiratory distress.  The patient was noted to be hypoxic with fire on scene with an oxygen saturation 62% on room air.  EMS arrived on scene, transition patient to CPAP.  The patient was also given 125 Solu-Medrol, 2 g of mag, one-time duo nebulizer and transported to the ED.  On arrival to the ED, the patient was continuing to complain of shortness of breath.  The patient was very tachypneic and diaphoretic.  Patient was transitioned over to BiPAP.  Patient continued to be tachypneic, continued to work to breathe.  The patient was then transitioned at this time to ventilator after being intubated.  Unable to collect much history from the patient's secondary to her respiratory distress.  HPI     Home Medications Prior to Admission medications   Medication Sig Start Date End Date Taking? Authorizing Provider  acetaminophen (TYLENOL) 500 MG tablet Take 500 mg by mouth every 6 (six) hours as needed for mild pain.    [provider]  albuterol (VENTOLIN HFA) 108 (90 Base) MCG/ACT inhaler Inhale 2 puffs into the lungs every 4 (four) hours as needed for wheezing or shortness of breath. 03/09/21   Celedonio Savage, MD  guaiFENesin (MUCINEX) 600 MG 12 hr tablet Take 1 tablet (600 mg total) by mouth 2 (two) times daily. 03/09/21   Celedonio Savage, MD  hydrOXYzine (ATARAX) 25 MG tablet Take 1 tablet (25 mg total) by mouth 3 (three) times daily as needed for anxiety. 03/09/21   Cresenzo, Cyndi Lennert, MD  nicotine (NICODERM CQ - DOSED IN MG/24 HR) 7 mg/24hr patch Place 1 patch (7 mg total) onto the skin daily. 03/10/21    Celedonio Savage, MD  sertraline (ZOLOFT) 25 MG tablet Take 1 tablet (25 mg total) by mouth daily. 03/10/21   Celedonio Savage, MD  Tiotropium Bromide Monohydrate (SPIRIVA RESPIMAT) 2.5 MCG/ACT AERS Inhale into the lungs daily. 03/09/21 03/09/22  Celedonio Savage, MD      Allergies    Patient has no known allergies.    Review of Systems   Review of Systems  Unable to perform ROS: Acuity of condition (Level 5 caveat)    Physical Exam Updated Vital Signs BP 95/70   Pulse 73   Temp (!) 97.4 F (36.3 C) (Axillary)   Resp 18   Ht 5\' 2"  (1.575 m)   Wt 101 kg   LMP 03/20/2009 (Within Years)   SpO2 99%   BMI 40.73 kg/m  Physical Exam Vitals and nursing note reviewed.  Constitutional:      General: She is in acute distress.     Appearance: She is toxic-appearing and diaphoretic.  HENT:     Head: Normocephalic and atraumatic.  Eyes:     Extraocular Movements: Extraocular movements intact.     Pupils: Pupils are equal, round, and reactive to light.  Cardiovascular:     Rate and Rhythm: Normal rate and regular rhythm.  Pulmonary:     Effort: Respiratory distress present.     Breath sounds: Wheezing and rales present.  Abdominal:     General:  Abdomen is flat.     Tenderness: There is no abdominal tenderness.  Musculoskeletal:     Cervical back: Normal range of motion. No tenderness.  Skin:    General: Skin is warm.  Neurological:     Cranial Nerves: Cranial nerves 2-12 are intact.     Sensory: Sensation is intact.     Motor: Motor function is intact.     ED Results / Procedures / Treatments   Labs (all labs ordered are listed, but only abnormal results are displayed) Labs Reviewed  RESP PANEL BY RT-PCR (RSV, FLU A&B, COVID)  RVPGX2 - Abnormal; Notable for the following components:      Result Value   Influenza A by PCR POSITIVE (*)    All other components within normal limits  CBC - Abnormal; Notable for the following components:   WBC 13.5 (*)    RBC 5.67 (*)     Hemoglobin 17.9 (*)    HCT 54.0 (*)    All other components within normal limits  I-STAT ARTERIAL BLOOD GAS, ED - Abnormal; Notable for the following components:   pH, Arterial 7.156 (*)    pCO2 arterial 101.5 (*)    pO2, Arterial 563 (*)    Bicarbonate 36.1 (*)    TCO2 39 (*)    Sodium 131 (*)    Calcium, Ion 1.13 (*)    HCT 52.0 (*)    Hemoglobin 17.7 (*)    All other components within normal limits  TROPONIN I (HIGH SENSITIVITY) - Abnormal; Notable for the following components:   Troponin I (High Sensitivity) 52 (*)    All other components within normal limits  BASIC METABOLIC PANEL  BLOOD GAS, ARTERIAL  TROPONIN I (HIGH SENSITIVITY)    EKG None  Radiology DG Chest Portable 1 View Result Date: 04/06/2023 CLINICAL DATA:  Shortness of breath intubated EXAM: PORTABLE CHEST 1 VIEW COMPARISON:  03/17/2021 FINDINGS: Endotracheal tube tip is about 2.6 cm superior to the carina. Esophageal tube tip below the diaphragm but incompletely visualized. Hyperinflation with emphysema and bronchitic changes. Difficult to exclude hazy pulmonary infiltrate at the right upper lobe. Borderline to mild cardiomegaly. No pneumothorax IMPRESSION: 1. Endotracheal tube tip about 2.6 cm superior to the carina. 2. Hyperinflation with emphysema and bronchitic changes. Difficult to exclude hazy pulmonary infiltrate at the right upper lobe. Electronically Signed   By: Jasmine Pang M.D.   On: 04/06/2023 14:51    Procedures Procedure Name: Intubation Date/Time: 04/06/2023 1:18 PM  Performed by: Al Decant, PA-CPre-anesthesia Checklist: Patient identified, Emergency Drugs available, Suction available, Patient being monitored and Timeout performed Oxygen Delivery Method: Ambu bag Preoxygenation: Pre-oxygenation with 100% oxygen Induction Type: IV induction Ventilation: Mask ventilation without difficulty Laryngoscope Size: 4 and Glidescope Endobronchial tube: Left Number of attempts: 1 Airway  Equipment and Method: Patient positioned with wedge pillow Placement Confirmation: ETT inserted through vocal cords under direct vision, Positive ETCO2 and Breath sounds checked- equal and bilateral Tube secured with: ETT holder Difficulty Due To: Difficulty was unanticipated    .Critical Care  Performed by: Al Decant, PA-C Authorized by: Al Decant, PA-C   Critical care provider statement:    Critical care time (minutes):  75   Critical care time was exclusive of:  Separately billable procedures and treating other patients and teaching time   Critical care was necessary to treat or prevent imminent or life-threatening deterioration of the following conditions:  Respiratory failure   Critical care was time spent personally by  me on the following activities:  Development of treatment plan with patient or surrogate, blood draw for specimens, discussions with primary provider, discussions with consultants, evaluation of patient's response to treatment, examination of patient, interpretation of cardiac output measurements, obtaining history from patient or surrogate, ordering and performing treatments and interventions, ordering and review of laboratory studies, pulse oximetry, ordering and review of radiographic studies, re-evaluation of patient's condition and review of old charts   I assumed direction of critical care for this patient from another provider in my specialty: no     Care discussed with: admitting provider      Medications Ordered in ED Medications  albuterol (PROVENTIL) (2.5 MG/3ML) 0.083% nebulizer solution (10 mg/hr Nebulization Not Given 04/06/23 1231)  fentaNYL (SUBLIMAZE) injection 50 mcg (has no administration in time range)  fentaNYL in NS (93mcg/ml) infusion-PREMIX (300 mcg/hr Intravenous Rate/Dose Change 04/06/23 1411)  fentaNYL (SUBLIMAZE) bolus via infusion 50-100 mcg (has no administration in time range)  propofol (DIPRIVAN) 1000  MG/100ML infusion (45 mcg/kg/min  101 kg Intravenous Rate/Dose Change 04/06/23 1351)  midazolam (VERSED) injection 1-2 mg (has no administration in time range)  0.9 %  sodium chloride infusion (250 mLs Intravenous New Bag/Given 04/06/23 1438)  norepinephrine (LEVOPHED) 4mg  in (0.016 mg/mL) premix infusion (5 mcg/min Intravenous Rate/Dose Change 04/06/23 1442)  albuterol (PROVENTIL) (2.5 MG/3ML) 0.083% nebulizer solution (  Given 04/06/23 1232)  etomidate (AMIDATE) injection 20 mg (20 mg Intravenous Given 04/06/23 1309)  succinylcholine (ANECTINE) syringe 150 mg (150 mg Intravenous Given 04/06/23 1309)  sodium chloride 0.9 % bolus 1,000 mL (1,000 mLs Intravenous New Bag/Given 04/06/23 1347)    ED Course/ Medical Decision Making/ A&P Clinical Course as of 04/06/23 1457  Fri Apr 06, 2023  1207 62% room air [CG]  1221 Received 125 solumedrol, 1 duoneb, 2g mag with EMS [CG]    Clinical Course User Index [CG] Al Decant, PA-C   Medical Decision Making Amount and/or Complexity of Data Reviewed Labs: ordered. Radiology: ordered.  Risk Prescription drug management. Decision regarding hospitalization.   53 year old female arrives to the ED for evaluation.  Please see HPI for further details.  Patient arrives on CPAP.  Patient arrives in obvious respiratory distress.  Patient received 125 Solu-Medrol with EMS, 2 g of mag, 1 DuoNeb.  She arrives tachypneic looking ill and in distress.  Patient was transitioned to BiPAP at this time.  Labs were collected to include CBC, BMP, i-STAT ABG, troponins, viral panel, chest x-ray.  Patient CBC shows leukocytosis of 13.5, no anemia.  Viral panel positive for influenza A.  EKG is nonischemic.  Patient troponin 52, delta pending.  I-STAT ABG shows pH 7.56, pCO2 101.5.  Patient appeared to be tiring out on BiPAP so decision was made at this time to intubate patient.  I intubated the patient with my attending Dr. Rosalia Hammers at the bedside.  Patient was  provided etomidate, succinylcholine for sedation.  Also placed on propofol.  Chest x-ray collected after intubation, tube was moved back 2 centimeters.  Bilateral breath sounds appreciated after intubation with equal rise of the chest.  After intubation, patient became hypotensive.  Patient was started on Levophed at this time as well as provided 1 L of fluid.  Patient will require admission to the hospital.  Case was discussed with critical care who agrees admit the patient.   Final Clinical Impression(s) / ED Diagnoses Final diagnoses:  Acute respiratory failure with hypoxia and hypercapnia (HCC)  COPD exacerbation (HCC)  Rx / DC Orders ED Discharge Orders     None         Clent Ridges 04/06/23 1527    Margarita Grizzle, MD 04/12/23 [phone]

## 2023-04-06 NOTE — Progress Notes (Signed)
Per verbal order given by ED MD, RT pulled back ETT 2cm. ETT now secured 23 at the lip.

## 2023-04-06 NOTE — H&P (Signed)
NAME:  Michele [name], MRN:  [id number], DOB:  [birth year], LOS: 0 ADMISSION DATE:  04/06/2023, CONSULTATION DATE:  04/06/2023 REFERRING MD:  Dr. Margarita Grizzle, CHIEF COMPLAINT:  respiratory failure   History of Present Illness:  [name] presented to [facility] emergency department via [facility] with complaints of respiratory distress.  She apparently reported shortness of breath x 2 days prior to presentation.  Per EMS, the patient was able to only speak in short sentences and subsequently became more short of breath after being loaded into the ambulance, down to 1 word sentences.  EMS administered Solu-Medrol 125 mg IV, magnesium 2 g IV and DuoNeb aerosols.  She arrived in the emergency department with demonstrably labored breathing and tachypnea.  In the ER, the patient was supported with BiPAP; however, she failed to improve.  ABG drawn at 1430: 7.12/111/345.  She was also found to be positive for influenza A infection.  Ultimately, decision was made by the ER staff to endotracheally intubate the patient.  PCCM service was called to admit the patient to the ICU.  Case was discussed with the ER nurse at bedside, who reported that the patient did require multiple boluses of propofol for sedation and subsequent intubation.  This resulted in hypotension, which prompted initiation of peripheral norepinephrine infusion.  Significant Hospital Events: Including procedures, antibiotic start and stop dates in addition to other pertinent events   Intubated in ER (1/17).  Found to be FLU A +.  Started on aerosols, steroids and Tamiflu.  Interim History / Subjective:  N/A  Objective   Blood pressure 97/65, pulse 69, temperature (!) 97.4 F (36.3 C), temperature source Axillary, resp. rate 18, height 5\' 2"  (1.575 m), weight 101 kg, last menstrual period 03/20/2009, SpO2 100%.    Vent Mode: PRVC FiO2 (%):  [60 %-100 %] 60 % Set Rate:  [18 bmp-26 bmp] 26 bmp Vt Set:   [400 mL-500 mL] 400 mL PEEP:  [5 cmH20] 5 cmH20 Pressure Support:  [10 cmH20] 10 cmH20 Plateau Pressure:  [27 cmH20] 27 cmH20  No intake or output data in the 24 hours ending 04/06/23 1520 Filed Weights   04/06/23 1213  Weight: 101 kg    Examination: General: Intubated, heavily sedated.  No acute distress. HENT: Pupils are sluggish.  Corneal reflex intact.  Endotracheal tube in situ. Lungs: Symmetric in development and expansion.  Diminished breath sounds.  Wheezes are auscultated despite mechanical ventilatory support.  No crackles. Cardiovascular: Regular S1 and S2 without murmur, rub or gallop. Abdomen: Soft, flat, nontender, nondistended.  NG tube in situ. Extremities: No pitting edema.  No clubbing.  No cyanosis. Neuro: Heavily sedated.  Minimal response to noxious stimuli. GU: Foley in situ.  Resolved Hospital Problem list   N/A  Assessment & Plan:  ACUTE HYPERCAPNIC RESPIRATORY FAILURE Titrate vent settings based on ABG results. Lung protective ventilation.  Maintain plateau pressure <= 30. Tracheal aspirate for Gram stain, C/S. COPD EXACERBATION, life-threatening Start DuoNeb unit scheduled every 6 hours. Albuterol unit dose nebulizer every 2 hours as needed for wheezing. Solu-Medrol 125 mg IV bolus followed by 80 mg IV every 12 hours. Optimization of COPD regimen prior to discharge. INFLUENZA A PNEUMONIA Start Tamiflu. CXR personally reviewed.  RUL infiltrate.   Given the findings on the respiratory viral culture, will withhold empiric antibiotics at this time.  However, low threshold to initiate antibiotic therapy.  Follow-up respiratory culture.  Monitor fever curve.  Leukocytosis is not particularly informative in  this scenario due to high-dose steroid administration, stress demargination, and acute viral infection. HYPOTENSION Almost certainly medication-induced (sedatives). Titrate sedation for RASS -2. Wean norepinephrine infusion as tolerated.  Keep MAP  65. HYPONATREMIA, mild Likely related to acute pneumonia. Monitor serum sodium. SECONDARY ERYTHROCYTOSIS/POLYCYTHEMIA Likely multifactorial: secondary to COPD/pulmonary disease, possible chronic hypoxia and long-term tobacco abuse. Tobacco abuse counseling when able.   Best Practice (right click and "Reselect all SmartList Selections" daily)   Diet/type: tubefeeds DVT prophylaxis LMWH Pressure ulcer(s): N/A GI prophylaxis: H2B Lines: N/A Foley:  Yes, and it is still needed Code Status:  full code Last date of multidisciplinary goals of care discussion [case discussed with ER RN and RT 1/17]  Labs   CBC: Recent Labs  Lab 04/06/23 1225 04/06/23 1448  WBC 13.5*  --   HGB 17.9* 17.7*  HCT 54.0* 52.0*  MCV 95.2  --   PLT 270  --     Basic Metabolic Panel: Recent Labs  Lab 04/06/23 1352 04/06/23 1448  NA 131* 131*  K 4.3 3.9  CL 87*  --   CO2 32  --   GLUCOSE 216*  --   BUN 13  --   CREATININE 0.89  --   CALCIUM 8.3*  --    GFR: Estimated Creatinine Clearance: 82.3 mL/min (by C-G formula based on SCr of 0.89 mg/dL). Recent Labs  Lab 04/06/23 1225  WBC 13.5*    Liver Function Tests: No results for input(s): "AST", "ALT", "ALKPHOS", "BILITOT", "PROT", "ALBUMIN" in the last 168 hours. No results for input(s): "LIPASE", "AMYLASE" in the last 168 hours. No results for input(s): "AMMONIA" in the last 168 hours.  ABG    Component Value Date/Time   PHART 7.156 (LL) 04/06/2023 1448   PCO2ART 101.5 (HH) 04/06/2023 1448   PO2ART 563 (H) 04/06/2023 1448   HCO3 36.1 (H) 04/06/2023 1448   TCO2 39 (H) 04/06/2023 1448   O2SAT 100 04/06/2023 1448     Coagulation Profile: No results for input(s): "INR", "PROTIME" in the last 168 hours.  Cardiac Enzymes: No results for input(s): "CKTOTAL", "CKMB", "CKMBINDEX", "TROPONINI" in the last 168 hours.  HbA1C: Hgb A1c MFr Bld  Date/Time Value Ref Range Status  03/03/2021 07:00 PM 6.5 (H) 4.8 - 5.6 % Final    Comment:     (NOTE) Pre diabetes:          5.7%-6.4%  Diabetes:              >6.4%  Glycemic control for   <7.0% adults with diabetes     CBG: No results for input(s): "GLUCAP" in the last 168 hours.  Review of Systems:   Not possible due to intubated/sedated status  Past Medical History:  She,  has a past medical history of COPD (chronic obstructive pulmonary disease) (HCC) and Medical history non-contributory.   Surgical History:   Past Surgical History:  Procedure Laterality Date   BILATERAL SALPINGECTOMY  04/07/2015   Procedure: BILATERAL SALPINGECTOMY with left oophorectomy, left ovarian cystectomy and removal of pelvic mass;  Surgeon: Kathreen Cosier, MD;  Location: [facility];  Service: Gynecology;;   CESAREAN SECTION     WISDOM TOOTH EXTRACTION       Social History:   reports that she has been smoking cigarettes. She has never used smokeless tobacco. She reports current alcohol use. She reports that she does not use drugs.   Family History:  Her family history includes Cancer in her maternal aunt, maternal uncle, and mother; Cervical  cancer in her mother.   Allergies No Known Allergies   Home Medications  Prior to Admission medications   Medication Sig Start Date End Date Taking? Authorizing Provider  acetaminophen (TYLENOL) 500 MG tablet Take 500 mg by mouth every 6 (six) hours as needed for mild pain.    [provider]  albuterol (VENTOLIN HFA) 108 (90 Base) MCG/ACT inhaler Inhale 2 puffs into the lungs every 4 (four) hours as needed for wheezing or shortness of breath. 03/09/21   Celedonio Savage, MD  guaiFENesin (MUCINEX) 600 MG 12 hr tablet Take 1 tablet (600 mg total) by mouth 2 (two) times daily. 03/09/21   Celedonio Savage, MD  hydrOXYzine (ATARAX) 25 MG tablet Take 1 tablet (25 mg total) by mouth 3 (three) times daily as needed for anxiety. 03/09/21   Cresenzo, Cyndi Lennert, MD  nicotine (NICODERM CQ - DOSED IN MG/24 HR) 7 mg/24hr patch Place 1 patch (7 mg  total) onto the skin daily. 03/10/21   Celedonio Savage, MD  sertraline (ZOLOFT) 25 MG tablet Take 1 tablet (25 mg total) by mouth daily. 03/10/21   Celedonio Savage, MD  Tiotropium Bromide Monohydrate (SPIRIVA RESPIMAT) 2.5 MCG/ACT AERS Inhale into the lungs daily. 03/09/21 03/09/22  Celedonio Savage, MD     Critical care time: 60 min

## 2023-04-07 ENCOUNTER — Inpatient Hospital Stay (HOSPITAL_COMMUNITY): Payer: 59

## 2023-04-07 DIAGNOSIS — I959 Hypotension, unspecified: Secondary | ICD-10-CM | POA: Diagnosis not present

## 2023-04-07 DIAGNOSIS — J09X1 Influenza due to identified novel influenza A virus with pneumonia: Secondary | ICD-10-CM

## 2023-04-07 DIAGNOSIS — E871 Hypo-osmolality and hyponatremia: Secondary | ICD-10-CM

## 2023-04-07 DIAGNOSIS — J9602 Acute respiratory failure with hypercapnia: Secondary | ICD-10-CM

## 2023-04-07 DIAGNOSIS — J441 Chronic obstructive pulmonary disease with (acute) exacerbation: Secondary | ICD-10-CM | POA: Diagnosis not present

## 2023-04-07 LAB — POCT I-STAT 7, (LYTES, BLD GAS, ICA,H+H)
Acid-Base Excess: 4 mmol/L — ABNORMAL HIGH (ref 0.0–2.0)
Acid-Base Excess: 6 mmol/L — ABNORMAL HIGH (ref 0.0–2.0)
Acid-Base Excess: 6 mmol/L — ABNORMAL HIGH (ref 0.0–2.0)
Bicarbonate: 34.5 mmol/L — ABNORMAL HIGH (ref 20.0–28.0)
Bicarbonate: 36.2 mmol/L — ABNORMAL HIGH (ref 20.0–28.0)
Bicarbonate: 37.1 mmol/L — ABNORMAL HIGH (ref 20.0–28.0)
Calcium, Ion: 1.09 mmol/L — ABNORMAL LOW (ref 1.15–1.40)
Calcium, Ion: 1.12 mmol/L — ABNORMAL LOW (ref 1.15–1.40)
Calcium, Ion: 1.21 mmol/L (ref 1.15–1.40)
HCT: 49 % — ABNORMAL HIGH (ref 36.0–46.0)
HCT: 50 % — ABNORMAL HIGH (ref 36.0–46.0)
HCT: 52 % — ABNORMAL HIGH (ref 36.0–46.0)
Hemoglobin: 16.7 g/dL — ABNORMAL HIGH (ref 12.0–15.0)
Hemoglobin: 17 g/dL — ABNORMAL HIGH (ref 12.0–15.0)
Hemoglobin: 17.7 g/dL — ABNORMAL HIGH (ref 12.0–15.0)
O2 Saturation: 91 %
O2 Saturation: 94 %
O2 Saturation: 94 %
Patient temperature: 36.7
Patient temperature: 36.9
Potassium: 4 mmol/L (ref 3.5–5.1)
Potassium: 4.2 mmol/L (ref 3.5–5.1)
Potassium: 4.4 mmol/L (ref 3.5–5.1)
Sodium: 131 mmol/L — ABNORMAL LOW (ref 135–145)
Sodium: 131 mmol/L — ABNORMAL LOW (ref 135–145)
Sodium: 132 mmol/L — ABNORMAL LOW (ref 135–145)
TCO2: 36 mmol/L — ABNORMAL HIGH (ref 22–32)
TCO2: 39 mmol/L — ABNORMAL HIGH (ref 22–32)
TCO2: 40 mmol/L — ABNORMAL HIGH (ref 22–32)
pCO2 arterial: 64.7 mm[Hg] — ABNORMAL HIGH (ref 32–48)
pCO2 arterial: 85.9 mm[Hg] (ref 32–48)
pCO2 arterial: 88.2 mm[Hg] (ref 32–48)
pH, Arterial: 7.22 — ABNORMAL LOW (ref 7.35–7.45)
pH, Arterial: 7.243 — ABNORMAL LOW (ref 7.35–7.45)
pH, Arterial: 7.334 — ABNORMAL LOW (ref 7.35–7.45)
pO2, Arterial: 77 mm[Hg] — ABNORMAL LOW (ref 83–108)
pO2, Arterial: 77 mm[Hg] — ABNORMAL LOW (ref 83–108)
pO2, Arterial: 89 mm[Hg] (ref 83–108)

## 2023-04-07 LAB — CBC
HCT: 45.4 % (ref 36.0–46.0)
Hemoglobin: 15.1 g/dL — ABNORMAL HIGH (ref 12.0–15.0)
MCH: 31.3 pg (ref 26.0–34.0)
MCHC: 33.3 g/dL (ref 30.0–36.0)
MCV: 94.2 fL (ref 80.0–100.0)
Platelets: 213 10*3/uL (ref 150–400)
RBC: 4.82 MIL/uL (ref 3.87–5.11)
RDW: 13.8 % (ref 11.5–15.5)
WBC: 7.7 10*3/uL (ref 4.0–10.5)
nRBC: 0 % (ref 0.0–0.2)

## 2023-04-07 LAB — BASIC METABOLIC PANEL
Anion gap: 9 (ref 5–15)
BUN: 22 mg/dL — ABNORMAL HIGH (ref 6–20)
CO2: 30 mmol/L (ref 22–32)
Calcium: 8.1 mg/dL — ABNORMAL LOW (ref 8.9–10.3)
Chloride: 93 mmol/L — ABNORMAL LOW (ref 98–111)
Creatinine, Ser: 1.18 mg/dL — ABNORMAL HIGH (ref 0.44–1.00)
GFR, Estimated: 56 mL/min — ABNORMAL LOW (ref >60)
Glucose, Bld: 190 mg/dL — ABNORMAL HIGH (ref 70–99)
Potassium: 4.4 mmol/L (ref 3.5–5.1)
Sodium: 132 mmol/L — ABNORMAL LOW (ref 135–145)

## 2023-04-07 LAB — PHOSPHORUS
Phosphorus: 2.3 mg/dL — ABNORMAL LOW (ref 2.5–4.6)
Phosphorus: 2.7 mg/dL (ref 2.5–4.6)

## 2023-04-07 LAB — MAGNESIUM
Magnesium: 2.5 mg/dL — ABNORMAL HIGH (ref 1.7–2.4)
Magnesium: 2.9 mg/dL — ABNORMAL HIGH (ref 1.7–2.4)

## 2023-04-07 LAB — GLUCOSE, CAPILLARY
Glucose-Capillary: 195 mg/dL — ABNORMAL HIGH (ref 70–99)
Glucose-Capillary: 165 mg/dL — ABNORMAL HIGH (ref 70–99)
Glucose-Capillary: 105 mg/dL — ABNORMAL HIGH (ref 70–99)
Glucose-Capillary: 163 mg/dL — ABNORMAL HIGH (ref 70–99)
Glucose-Capillary: 248 mg/dL — ABNORMAL HIGH (ref 70–99)
Glucose-Capillary: 215 mg/dL — ABNORMAL HIGH (ref 70–99)

## 2023-04-07 LAB — HEMOGLOBIN A1C
Hgb A1c MFr Bld: 6.2 % — ABNORMAL HIGH (ref 4.8–5.6)
Mean Plasma Glucose: 131.24 mg/dL

## 2023-04-07 LAB — TRIGLYCERIDES: Triglycerides: 381 mg/dL — ABNORMAL HIGH (ref <150)

## 2023-04-07 MED ORDER — VITAL 1.5 CAL PO LIQD
1000.0000 mL | ORAL | Status: DC
  Administered 2023-04-07 – 2023-04-11 (×5): 1000 mL
  Filled 2023-04-07 (×3): qty 1000

## 2023-04-07 MED ORDER — IPRATROPIUM-ALBUTEROL 0.5-2.5 (3) MG/3ML IN SOLN
3.0000 mL | RESPIRATORY_TRACT | Status: DC
  Administered 2023-04-07 – 2023-04-09 (×15): 3 mL via RESPIRATORY_TRACT
  Filled 2023-04-07 (×14): qty 3

## 2023-04-07 MED ORDER — LACTATED RINGERS IV SOLN: INTRAVENOUS | Status: AC

## 2023-04-07 MED ORDER — INSULIN ASPART 100 UNIT/ML IJ SOLN
0.0000 [IU] | INTRAMUSCULAR | Status: DC
  Administered 2023-04-07 – 2023-04-08 (×4): 7 [IU] via SUBCUTANEOUS
  Administered 2023-04-08: 3 [IU] via SUBCUTANEOUS
  Administered 2023-04-08: 4 [IU] via SUBCUTANEOUS
  Administered 2023-04-08: 11 [IU] via SUBCUTANEOUS
  Administered 2023-04-08: 3 [IU] via SUBCUTANEOUS
  Administered 2023-04-09: 4 [IU] via SUBCUTANEOUS
  Administered 2023-04-09: 3 [IU] via SUBCUTANEOUS
  Administered 2023-04-09: 4 [IU] via SUBCUTANEOUS
  Administered 2023-04-09: 7 [IU] via SUBCUTANEOUS
  Administered 2023-04-09: 11 [IU] via SUBCUTANEOUS
  Administered 2023-04-10: 4 [IU] via SUBCUTANEOUS
  Administered 2023-04-10: 7 [IU] via SUBCUTANEOUS
  Administered 2023-04-10 (×4): 4 [IU] via SUBCUTANEOUS
  Administered 2023-04-11: 11 [IU] via SUBCUTANEOUS
  Administered 2023-04-11: 4 [IU] via SUBCUTANEOUS

## 2023-04-07 MED ORDER — DEXMEDETOMIDINE HCL IN NACL 400 MCG/100ML IV SOLN
0.0000 ug/kg/h | INTRAVENOUS | Status: DC
  Administered 2023-04-07 (×2): 0.4 ug/kg/h via INTRAVENOUS
  Administered 2023-04-08: 0.7 ug/kg/h via INTRAVENOUS
  Administered 2023-04-08: 0.8 ug/kg/h via INTRAVENOUS
  Administered 2023-04-08: 1.2 ug/kg/h via INTRAVENOUS
  Administered 2023-04-09: 0.6 ug/kg/h via INTRAVENOUS
  Administered 2023-04-09: 0.7 ug/kg/h via INTRAVENOUS
  Filled 2023-04-07 (×2): qty 100
  Filled 2023-04-07: qty 200
  Filled 2023-04-07 (×4): qty 100

## 2023-04-07 MED ORDER — METHYLPREDNISOLONE SODIUM SUCC 125 MG IJ SOLR
60.0000 mg | Freq: Four times a day (QID) | INTRAMUSCULAR | Status: DC
  Administered 2023-04-07 – 2023-04-08 (×3): 60 mg via INTRAVENOUS
  Filled 2023-04-07 (×4): qty 2

## 2023-04-07 NOTE — Progress Notes (Incomplete Revision)
NAME:  Michele [name], MRN:  [id number], DOB:  08-Mar-1971, LOS: 1 ADMISSION DATE:  04/06/2023, CONSULTATION DATE:  04/06/2023 REFERRING MD:  Dr. Margarita Grizzle, CHIEF COMPLAINT:  respiratory failure   History of Present Illness:  [name] presented to [facility] emergency department via [facility] with complaints of respiratory distress.  She apparently reported shortness of breath x 2 days prior to presentation.  Per EMS, the patient was able to only speak in short sentences and subsequently became more short of breath after being loaded into the ambulance, down to 1 word sentences.  EMS administered Solu-Medrol 125 mg IV, magnesium 2 g IV and DuoNeb aerosols.  She arrived in the emergency department with demonstrably labored breathing and tachypnea.  In the ER, the patient was supported with BiPAP; however, she failed to improve.  ABG drawn at 1430: 7.12/111/345.  She was also found to be positive for influenza A infection.  Ultimately, decision was made by the ER staff to endotracheally intubate the patient.  PCCM service was called to admit the patient to the ICU.  Case was discussed with the ER nurse at bedside, who reported that the patient did require multiple boluses of propofol for sedation and subsequent intubation.  This resulted in hypotension, which prompted initiation of peripheral norepinephrine infusion.  Significant Hospital Events: Including procedures, antibiotic start and stop dates in addition to other pertinent events   Intubated in ER (1/17).  Found to be FLU A +.  Started on aerosols, steroids and Tamiflu.  Interim History / Subjective:  Transferred to 18M ICU from ER in the interim.  Remains intubated and sedated.  Still on low-dose norepinephrine for blood pressure support.  Still wheezing on the ventilator.  RASS 0 to -1.  Complains of itchy nose (on fentanyl).  Objective   Blood pressure 105/63, pulse 75, temperature (!) 97.4 F (36.3  C), temperature source Axillary, resp. rate (!) 22, height 5\' 2"  (1.575 m), weight 94.4 kg, last menstrual period 03/20/2009, SpO2 94%.    Vent Mode: PRVC FiO2 (%):  [40 %-60 %] 50 % Set Rate:  [22 bmp-30 bmp] 30 bmp Vt Set:  [400 mL-450 mL] 450 mL PEEP:  [5 cmH20] 5 cmH20 Plateau Pressure:  [20 cmH20-30 cmH20] 23 cmH20   Intake/Output Summary (Last 24 hours) at 04/07/2023 1544 Last data filed at 04/07/2023 1300 Gross per 24 hour  Intake 3931.69 ml  Output 200 ml  Net 3731.69 ml   Filed Weights   04/06/23 1213 04/06/23 1700 04/07/23 0500  Weight: 101 kg 91.6 kg 94.4 kg    Examination: General: Intubated, heavily sedated.  No acute distress. HENT: Pupils are sluggish.  Corneal reflex intact.  Endotracheal tube in situ. Lungs: Symmetric in development and expansion.  Diminished breath sounds.  Wheezes are auscultated despite mechanical ventilatory support.  No crackles. Cardiovascular: Regular S1 and S2 without murmur, rub or gallop. Abdomen: Soft, flat, nontender, nondistended.  NG tube in situ. Extremities: No pitting edema.  No clubbing.  No cyanosis. Neuro: Heavily sedated.  Minimal response to noxious stimuli. GU: Foley in situ.  Resolved Hospital Problem list   N/A  Assessment & Plan:  ACUTE HYPERCAPNIC RESPIRATORY FAILURE Titrate vent settings based on ABG results. Lung protective ventilation.  Maintain plateau pressure <= 30.  Will try switching to [facility] mode ventilation today.  Follow up ABG. Tracheal aspirate for Gram stain, C/S. COPD EXACERBATION, life-threatening Continue DuoNeb unit scheduled every 6 hours. Albuterol unit dose nebulizer every  2 hours as needed for wheezing. Change Solu-Medrol to 60 mg IV every 6 hours. Optimization of COPD regimen prior to discharge. INFLUENZA A PNEUMONIA Continue Tamiflu. CXR personally reviewed.  RUL infiltrate.   Given the findings on the respiratory viral culture, will withhold empiric antibiotics at this time.  However, low  threshold to initiate antibiotic therapy.  Follow-up respiratory culture.  Monitor fever curve.  Leukocytosis is not particularly informative in this scenario due to high-dose steroid administration, stress demargination, and acute viral infection. HYPOTENSION Almost certainly medication-induced (sedatives). Will add Precedex gtt today.  Continue propofol gtt.  Try to wean fentanyl. Titrate sedation for RASS -2. Wean norepinephrine infusion as tolerated.  Keep MAP 65. HYPONATREMIA, mild Likely related to acute pneumonia. Monitor serum sodium. SECONDARY ERYTHROCYTOSIS/POLYCYTHEMIA Likely multifactorial: secondary to COPD/pulmonary disease, possible chronic hypoxia and long-term tobacco abuse. Tobacco abuse counseling when able.   Best Practice (right click and "Reselect all SmartList Selections" daily)   Diet/type: tubefeeds DVT prophylaxis LMWH Pressure ulcer(s): N/A GI prophylaxis: H2B Lines: N/A Foley:  Yes, and it is still needed Code Status:  full code Last date of multidisciplinary goals of care discussion [case discussed with ER RN and RT 1/17]  Labs   CBC: Recent Labs  Lab 04/06/23 1225 04/06/23 1448 04/06/23 2003 04/07/23 0010 04/07/23 0502 04/07/23 0716  WBC 13.5*  --   --   --   --  7.7  HGB 17.9* 17.7* 18.0* 17.7* 17.0* 15.1*  HCT 54.0* 52.0* 53.0* 52.0* 50.0* 45.4  MCV 95.2  --   --   --   --  94.2  PLT 270  --   --   --   --  213    Basic Metabolic Panel: Recent Labs  Lab 04/06/23 1352 04/06/23 1448 04/06/23 1748 04/06/23 2003 04/07/23 0010 04/07/23 0502 04/07/23 0716  NA 131* 131*  --  132* 132* 131* 132*  K 4.3 3.9  --  4.4 4.4 4.0 4.4  CL 87*  --   --   --   --   --  93*  CO2 32  --   --   --   --   --  30  GLUCOSE 216*  --   --   --   --   --  190*  BUN 13  --   --   --   --   --  22*  CREATININE 0.89  --  0.98  --   --   --  1.18*  CALCIUM 8.3*  --   --   --   --   --  8.1*  MG  --   --   --   --   --   --  2.5*  PHOS  --   --   --   --    --   --  2.3*   GFR: Estimated Creatinine Clearance: 59.7 mL/min (A) (by C-G formula based on SCr of 1.18 mg/dL (H)). Recent Labs  Lab 04/06/23 1225 04/07/23 0716  WBC 13.5* 7.7    Liver Function Tests: No results for input(s): "AST", "ALT", "ALKPHOS", "BILITOT", "PROT", "ALBUMIN" in the last 168 hours. No results for input(s): "LIPASE", "AMYLASE" in the last 168 hours. No results for input(s): "AMMONIA" in the last 168 hours.  ABG    Component Value Date/Time   PHART 7.334 (L) 04/07/2023 0502   PCO2ART 64.7 (H) 04/07/2023 0502   PO2ART 77 (L) 04/07/2023 0502   HCO3 34.5 (H) 04/07/2023  0502   TCO2 36 (H) 04/07/2023 0502   O2SAT 94 04/07/2023 0502     Coagulation Profile: No results for input(s): "INR", "PROTIME" in the last 168 hours.  Cardiac Enzymes: No results for input(s): "CKTOTAL", "CKMB", "CKMBINDEX", "TROPONINI" in the last 168 hours.  HbA1C: Hgb A1c MFr Bld  Date/Time Value Ref Range Status  03/03/2021 07:00 PM 6.5 (H) 4.8 - 5.6 % Final    Comment:    (NOTE) Pre diabetes:          5.7%-6.4%  Diabetes:              >6.4%  Glycemic control for   <7.0% adults with diabetes     CBG: Recent Labs  Lab 04/06/23 2334 04/07/23 0326 04/07/23 0812 04/07/23 1124 04/07/23 1519  GLUCAP 154* 195* 165* 105* 163*    Review of Systems:   Not possible due to intubated/sedated status  Past Medical History:  She,  has a past medical history of COPD (chronic obstructive pulmonary disease) (HCC) and Medical history non-contributory.   Surgical History:   Past Surgical History:  Procedure Laterality Date   BILATERAL SALPINGECTOMY  04/07/2015   Procedure: BILATERAL SALPINGECTOMY with left oophorectomy, left ovarian cystectomy and removal of pelvic mass;  Surgeon: Kathreen Cosier, MD;  Location: [facility];  Service: Gynecology;;   CESAREAN SECTION     WISDOM TOOTH EXTRACTION       Social History:   reports that she has been smoking cigarettes. She has never  used smokeless tobacco. She reports current alcohol use. She reports that she does not use drugs.   Family History:  Her family history includes Cancer in her maternal aunt, maternal uncle, and mother; Cervical cancer in her mother.   Allergies No Known Allergies   Home Medications  Prior to Admission medications   Medication Sig Start Date End Date Taking? Authorizing Provider  acetaminophen (TYLENOL) 500 MG tablet Take 500 mg by mouth every 6 (six) hours as needed for mild pain.    [provider]  albuterol (VENTOLIN HFA) 108 (90 Base) MCG/ACT inhaler Inhale 2 puffs into the lungs every 4 (four) hours as needed for wheezing or shortness of breath. 03/09/21   Celedonio Savage, MD  guaiFENesin (MUCINEX) 600 MG 12 hr tablet Take 1 tablet (600 mg total) by mouth 2 (two) times daily. 03/09/21   Celedonio Savage, MD  hydrOXYzine (ATARAX) 25 MG tablet Take 1 tablet (25 mg total) by mouth 3 (three) times daily as needed for anxiety. 03/09/21   Cresenzo, Cyndi Lennert, MD  nicotine (NICODERM CQ - DOSED IN MG/24 HR) 7 mg/24hr patch Place 1 patch (7 mg total) onto the skin daily. 03/10/21   Celedonio Savage, MD  sertraline (ZOLOFT) 25 MG tablet Take 1 tablet (25 mg total) by mouth daily. 03/10/21   Celedonio Savage, MD  Tiotropium Bromide Monohydrate (SPIRIVA RESPIMAT) 2.5 MCG/ACT AERS Inhale into the lungs daily. 03/09/21 03/09/22  Celedonio Savage, MD     Critical care time: 60 min    Critical care time: 60 minutes.  The treatment and management of the patient's condition was required based on the threat of imminent deterioration. This time reflects time spent by the physician evaluating, providing care and managing the critically ill patient's care. The time was spent at the immediate bedside (or on the same floor/unit and dedicated to this patient's care). Time involved in separately billable procedures is NOT included int he critical care time indicated above.  Family meeting and update time  may be included above if and only if the patient is unable/incompetent to participate in clinical interview and/or decision making, and the discussion was necessary to determining treatment decisions.  Marcelle Smiling, MD Board Certified by the ABIM, Pulmonary Diseases & Critical Care Medicine

## 2023-04-07 NOTE — Progress Notes (Signed)
MD Ardeth Perfect at bedside, patient complains of itching, specifically on their nose.  MD Ardeth Perfect advised this can be a side effect of Fentanyl and suggested RN increase propofol and decrease Fentanyl for patient comfort if able to keep patient comfortable.  RN and MD discussed patient's urinary retention.  Patient has not voided.  Bladder scans routinely completed with last bladder scan showing 474 mL of urine in bladder.  Patient states she does not feel any fullness or urge to void at this time.   MD Ardeth Perfect advised to place a foley catheter at this time verse perform in and out catheterizations.

## 2023-04-07 NOTE — Progress Notes (Signed)
Initial Nutrition Assessment  DOCUMENTATION CODES:   Obesity unspecified  INTERVENTION:   Continue tube feeding via OG tube: - Change to Vital 1.5 @ 40 mL/hr and advance rate by 10 mL every 6 hours to goal of 50 mL/hr (1200 mL/day) - PROSource TF20 60 mL daily  Tube feeding regimen at goal rate provides 1880 kcal, 101 grams of protein, and 917 ml of H2O.   NUTRITION DIAGNOSIS:   Inadequate oral intake related to acute illness, inability to eat as evidenced by NPO status.  GOAL:   Patient will meet greater than or equal to 90% of their needs  MONITOR:   Vent status, Labs, Weight trends, TF tolerance  REASON FOR ASSESSMENT:   Ventilator, Consult Enteral/tube feeding initiation and management  ASSESSMENT:   53 year old female who presented to the ED on 1/17 with respiratory distress. PMH of COPD, tobacco use, OSA on BiPAP, DM. Pt admitted with acute hypercapnic respiratory failure, COPD exacerbation, influenza A pneumonia.  01/17 - admission, intubated  Consult received for enteral nutrition initiation and management. Patient is currently intubated on ventilator support. Pt with OG tube coursing tip below the diaphragm but incompletely visualized per chest x-ray reading. Pt currently receiving Pivot 1.5 @ 30 mL/hr with PROSource TF20 60 mL daily per Adult Tube Feeding Protocol.  Unable to obtain diet and weight history at this time. Per weight history in chart, weight is down 6.6 kg (6.5% weight loss) over the last 2 years. This is not clinically significant for timeframe.  Admit weight: 101 kg (question accuracy) Current weight: 94.4 kg  Drips: Propofol: 20 mcg/kg/min (provides 320 kcal daily from lipid) Fentanyl Levophed: off this AM  Medications reviewed and include: pepcid 20 mg BID, IV solu-medrol, tamiflu  Labs reviewed: sodium 132, chloride 93, BUN 22, creatinine 1.18, ionized calcium 1.09, phosphorus 2.3, magnesium 2.5, TG 381 CBG's: 154-195  UOP: 2  unmeasured occurrences since admission I/O's: +3.3 L since admission  NUTRITION - FOCUSED PHYSICAL EXAM:  Unable to complete. RD working remotely.  Diet Order:   Diet Order     None       EDUCATION NEEDS:   Not appropriate for education at this time  Skin:  Skin Assessment: Reviewed RN Assessment  Last BM:  no documented BM  Height:   Ht Readings from Last 1 Encounters:  04/06/23 5\' 2"  (1.575 m)    Weight:   Wt Readings from Last 1 Encounters:  04/07/23 94.4 kg    Ideal Body Weight:  50 kg  BMI:  Body mass index is 38.06 kg/m.  Estimated Nutritional Needs:   Kcal:  1800-2000  Protein:  100-115 grams  Fluid:  1.8-2.0 L    Mertie Clause, MS, RD, LDN Registered Dietitian II Please see AMiON for contact information.

## 2023-04-07 NOTE — Progress Notes (Signed)
NAME:  Michele [name], MRN:  [id number], DOB:  08-Mar-1971, LOS: 1 ADMISSION DATE:  04/06/2023, CONSULTATION DATE:  04/06/2023 REFERRING MD:  Dr. Margarita Grizzle, CHIEF COMPLAINT:  respiratory failure   History of Present Illness:  [name] presented to [facility] emergency department via [facility] with complaints of respiratory distress.  She apparently reported shortness of breath x 2 days prior to presentation.  Per EMS, the patient was able to only speak in short sentences and subsequently became more short of breath after being loaded into the ambulance, down to 1 word sentences.  EMS administered Solu-Medrol 125 mg IV, magnesium 2 g IV and DuoNeb aerosols.  She arrived in the emergency department with demonstrably labored breathing and tachypnea.  In the ER, the patient was supported with BiPAP; however, she failed to improve.  ABG drawn at 1430: 7.12/111/345.  She was also found to be positive for influenza A infection.  Ultimately, decision was made by the ER staff to endotracheally intubate the patient.  PCCM service was called to admit the patient to the ICU.  Case was discussed with the ER nurse at bedside, who reported that the patient did require multiple boluses of propofol for sedation and subsequent intubation.  This resulted in hypotension, which prompted initiation of peripheral norepinephrine infusion.  Significant Hospital Events: Including procedures, antibiotic start and stop dates in addition to other pertinent events   Intubated in ER (1/17).  Found to be FLU A +.  Started on aerosols, steroids and Tamiflu.  Interim History / Subjective:  Transferred to 18M ICU from ER in the interim.  Remains intubated and sedated.  Still on low-dose norepinephrine for blood pressure support.  Still wheezing on the ventilator.  RASS 0 to -1.  Complains of itchy nose (on fentanyl).  Objective   Blood pressure 105/63, pulse 75, temperature (!) 97.4 F (36.3  C), temperature source Axillary, resp. rate (!) 22, height 5\' 2"  (1.575 m), weight 94.4 kg, last menstrual period 03/20/2009, SpO2 94%.    Vent Mode: PRVC FiO2 (%):  [40 %-60 %] 50 % Set Rate:  [22 bmp-30 bmp] 30 bmp Vt Set:  [400 mL-450 mL] 450 mL PEEP:  [5 cmH20] 5 cmH20 Plateau Pressure:  [20 cmH20-30 cmH20] 23 cmH20   Intake/Output Summary (Last 24 hours) at 04/07/2023 1544 Last data filed at 04/07/2023 1300 Gross per 24 hour  Intake 3931.69 ml  Output 200 ml  Net 3731.69 ml   Filed Weights   04/06/23 1213 04/06/23 1700 04/07/23 0500  Weight: 101 kg 91.6 kg 94.4 kg    Examination: General: Intubated, heavily sedated.  No acute distress. HENT: Pupils are sluggish.  Corneal reflex intact.  Endotracheal tube in situ. Lungs: Symmetric in development and expansion.  Diminished breath sounds.  Wheezes are auscultated despite mechanical ventilatory support.  No crackles. Cardiovascular: Regular S1 and S2 without murmur, rub or gallop. Abdomen: Soft, flat, nontender, nondistended.  NG tube in situ. Extremities: No pitting edema.  No clubbing.  No cyanosis. Neuro: Heavily sedated.  Minimal response to noxious stimuli. GU: Foley in situ.  Resolved Hospital Problem list   N/A  Assessment & Plan:  ACUTE HYPERCAPNIC RESPIRATORY FAILURE Titrate vent settings based on ABG results. Lung protective ventilation.  Maintain plateau pressure <= 30.  Will try switching to [facility] mode ventilation today.  Follow up ABG. Tracheal aspirate for Gram stain, C/S. COPD EXACERBATION, life-threatening Continue DuoNeb unit scheduled every 6 hours. Albuterol unit dose nebulizer every  2 hours as needed for wheezing. Change Solu-Medrol to 60 mg IV every 6 hours. Optimization of COPD regimen prior to discharge. INFLUENZA A PNEUMONIA Continue Tamiflu. CXR personally reviewed.  RUL infiltrate.   Given the findings on the respiratory viral culture, will withhold empiric antibiotics at this time.  However, low  threshold to initiate antibiotic therapy.  Follow-up respiratory culture.  Monitor fever curve.  Leukocytosis is not particularly informative in this scenario due to high-dose steroid administration, stress demargination, and acute viral infection. HYPOTENSION Almost certainly medication-induced (sedatives). Will add Precedex gtt today.  Continue propofol gtt.  Try to wean fentanyl. Titrate sedation for RASS -2. Wean norepinephrine infusion as tolerated.  Keep MAP 65. HYPONATREMIA, mild Likely related to acute pneumonia. Monitor serum sodium. SECONDARY ERYTHROCYTOSIS/POLYCYTHEMIA Likely multifactorial: secondary to COPD/pulmonary disease, possible chronic hypoxia and long-term tobacco abuse. Tobacco abuse counseling when able.   Best Practice (right click and "Reselect all SmartList Selections" daily)   Diet/type: tubefeeds DVT prophylaxis LMWH Pressure ulcer(s): N/A GI prophylaxis: H2B Lines: N/A Foley:  Yes, and it is still needed Code Status:  full code Last date of multidisciplinary goals of care discussion [case discussed with ER RN and RT 1/17]  Labs   CBC: Recent Labs  Lab 04/06/23 1225 04/06/23 1448 04/06/23 2003 04/07/23 0010 04/07/23 0502 04/07/23 0716  WBC 13.5*  --   --   --   --  7.7  HGB 17.9* 17.7* 18.0* 17.7* 17.0* 15.1*  HCT 54.0* 52.0* 53.0* 52.0* 50.0* 45.4  MCV 95.2  --   --   --   --  94.2  PLT 270  --   --   --   --  213    Basic Metabolic Panel: Recent Labs  Lab 04/06/23 1352 04/06/23 1448 04/06/23 1748 04/06/23 2003 04/07/23 0010 04/07/23 0502 04/07/23 0716  NA 131* 131*  --  132* 132* 131* 132*  K 4.3 3.9  --  4.4 4.4 4.0 4.4  CL 87*  --   --   --   --   --  93*  CO2 32  --   --   --   --   --  30  GLUCOSE 216*  --   --   --   --   --  190*  BUN 13  --   --   --   --   --  22*  CREATININE 0.89  --  0.98  --   --   --  1.18*  CALCIUM 8.3*  --   --   --   --   --  8.1*  MG  --   --   --   --   --   --  2.5*  PHOS  --   --   --   --    --   --  2.3*   GFR: Estimated Creatinine Clearance: 59.7 mL/min (A) (by C-G formula based on SCr of 1.18 mg/dL (H)). Recent Labs  Lab 04/06/23 1225 04/07/23 0716  WBC 13.5* 7.7    Liver Function Tests: No results for input(s): "AST", "ALT", "ALKPHOS", "BILITOT", "PROT", "ALBUMIN" in the last 168 hours. No results for input(s): "LIPASE", "AMYLASE" in the last 168 hours. No results for input(s): "AMMONIA" in the last 168 hours.  ABG    Component Value Date/Time   PHART 7.334 (L) 04/07/2023 0502   PCO2ART 64.7 (H) 04/07/2023 0502   PO2ART 77 (L) 04/07/2023 0502   HCO3 34.5 (H) 04/07/2023  0502   TCO2 36 (H) 04/07/2023 0502   O2SAT 94 04/07/2023 0502     Coagulation Profile: No results for input(s): "INR", "PROTIME" in the last 168 hours.  Cardiac Enzymes: No results for input(s): "CKTOTAL", "CKMB", "CKMBINDEX", "TROPONINI" in the last 168 hours.  HbA1C: Hgb A1c MFr Bld  Date/Time Value Ref Range Status  03/03/2021 07:00 PM 6.5 (H) 4.8 - 5.6 % Final    Comment:    (NOTE) Pre diabetes:          5.7%-6.4%  Diabetes:              >6.4%  Glycemic control for   <7.0% adults with diabetes     CBG: Recent Labs  Lab 04/06/23 2334 04/07/23 0326 04/07/23 0812 04/07/23 1124 04/07/23 1519  GLUCAP 154* 195* 165* 105* 163*    Review of Systems:   Not possible due to intubated/sedated status  Past Medical History:  She,  has a past medical history of COPD (chronic obstructive pulmonary disease) (HCC) and Medical history non-contributory.   Surgical History:   Past Surgical History:  Procedure Laterality Date   BILATERAL SALPINGECTOMY  04/07/2015   Procedure: BILATERAL SALPINGECTOMY with left oophorectomy, left ovarian cystectomy and removal of pelvic mass;  Surgeon: Kathreen Cosier, MD;  Location: [facility];  Service: Gynecology;;   CESAREAN SECTION     WISDOM TOOTH EXTRACTION       Social History:   reports that she has been smoking cigarettes. She has never  used smokeless tobacco. She reports current alcohol use. She reports that she does not use drugs.   Family History:  Her family history includes Cancer in her maternal aunt, maternal uncle, and mother; Cervical cancer in her mother.   Allergies No Known Allergies   Home Medications  Prior to Admission medications   Medication Sig Start Date End Date Taking? Authorizing Provider  acetaminophen (TYLENOL) 500 MG tablet Take 500 mg by mouth every 6 (six) hours as needed for mild pain.    [provider]  albuterol (VENTOLIN HFA) 108 (90 Base) MCG/ACT inhaler Inhale 2 puffs into the lungs every 4 (four) hours as needed for wheezing or shortness of breath. 03/09/21   Celedonio Savage, MD  guaiFENesin (MUCINEX) 600 MG 12 hr tablet Take 1 tablet (600 mg total) by mouth 2 (two) times daily. 03/09/21   Celedonio Savage, MD  hydrOXYzine (ATARAX) 25 MG tablet Take 1 tablet (25 mg total) by mouth 3 (three) times daily as needed for anxiety. 03/09/21   Cresenzo, Cyndi Lennert, MD  nicotine (NICODERM CQ - DOSED IN MG/24 HR) 7 mg/24hr patch Place 1 patch (7 mg total) onto the skin daily. 03/10/21   Celedonio Savage, MD  sertraline (ZOLOFT) 25 MG tablet Take 1 tablet (25 mg total) by mouth daily. 03/10/21   Celedonio Savage, MD  Tiotropium Bromide Monohydrate (SPIRIVA RESPIMAT) 2.5 MCG/ACT AERS Inhale into the lungs daily. 03/09/21 03/09/22  Celedonio Savage, MD     Critical care time: 60 min    Critical care time: 60 minutes.  The treatment and management of the patient's condition was required based on the threat of imminent deterioration. This time reflects time spent by the physician evaluating, providing care and managing the critically ill patient's care. The time was spent at the immediate bedside (or on the same floor/unit and dedicated to this patient's care). Time involved in separately billable procedures is NOT included int he critical care time indicated above.  Family meeting and update time  may be included above if and only if the patient is unable/incompetent to participate in clinical interview and/or decision making, and the discussion was necessary to determining treatment decisions.  Marcelle Smiling, MD Board Certified by the ABIM, Pulmonary Diseases & Critical Care Medicine

## 2023-04-07 NOTE — Progress Notes (Signed)
eLink Physician-Brief Progress Note Patient Name: Michele [name] DOB: [birth year] MRN: [id number]   Date of Service  04/07/2023  HPI/Events of Note  Hyperglycemia  eICU Interventions  SSI with coverage    Order placed    [facility] 04/07/2023, 8:59 PM

## 2023-04-07 NOTE — Progress Notes (Addendum)
NAME:  Michele Michele [name], MRN:  [id number], DOB:  June 15, 1970, LOS: 1 ADMISSION DATE:  04/06/2023, CONSULTATION DATE:  04/06/2023 REFERRING MD:  Dr. Margarita Grizzle, CHIEF COMPLAINT:  respiratory failure   History of Present Illness:  [name] presented to [facility] emergency department via [facility] with complaints of respiratory distress.  She apparently reported shortness of breath x 2 days prior to presentation.  Per EMS, the patient Michele [name] to only speak in short sentences and subsequently became more short of breath after being loaded into the ambulance, down to 1 word sentences.  EMS administered Solu-Medrol 125 mg IV, magnesium 2 g IV and DuoNeb aerosols.  She arrived in the emergency department with demonstrably labored breathing and tachypnea.  In the ER, the patient Michele [name] supported with BiPAP; however, she failed to improve.  ABG drawn at 1430: 7.12/111/345.  She Michele [name] also found to be positive for influenza A infection.  Ultimately, decision Michele [name] made by the ER staff to endotracheally intubate the patient.  PCCM service Michele [name] called to admit the patient to the ICU.  Case Michele [name] discussed with the ER nurse at bedside, who reported that the patient did require multiple boluses of propofol for sedation and subsequent intubation.  This resulted in hypotension, which prompted initiation of peripheral norepinephrine infusion.  Significant Hospital Events: Including procedures, antibiotic start and stop dates in addition to other pertinent events   1/17 Intubated in ER.  Found to be FLU A +.  Started on aerosols, steroids and Tamiflu. 1/18 pCO2 improved on a.m. ABG, no issues overnight.  Remains on deep sedation with continued faint bilateral wheezing on exam  Interim History / Subjective:  Sedated on ventilator  Objective   Blood pressure (!) 199/154, pulse 65, temperature 98 F (36.7 C), temperature source Oral, resp. rate (!) 30, height 5\' 2"  (1.575 m), weight  94.4 kg, last menstrual period 03/20/2009, SpO2 95%.    Vent Mode: PRVC FiO2 (%):  [40 %-100 %] 50 % Set Rate:  [18 bmp-30 bmp] 30 bmp Vt Set:  [400 mL-500 mL] 450 mL PEEP:  [5 cmH20] 5 cmH20 Pressure Support:  [10 cmH20] 10 cmH20 Plateau Pressure:  [20 cmH20-30 cmH20] 22 cmH20   Intake/Output Summary (Last 24 hours) at 04/07/2023 1154 Last data filed at 04/07/2023 1000 Gross per 24 hour  Intake 3630.18 ml  Output 200 ml  Net 3430.18 ml   Filed Weights   04/06/23 1213 04/06/23 1700 04/07/23 0500  Weight: 101 kg 91.6 kg 94.4 kg    Examination: General: Acute on chronic ill-appearing middle-aged female lying in bed on mechanical ventilation no acute distress HEENT: ETT, MM pink/moist, PERRL,  Neuro: Deeply sedated on ventilator CV: s1s2 regular rate and rhythm, no murmur, rubs, or gallops,  PULM: Faint bilateral wheeze, no increased work of breathing, tolerating ventilator GI: soft, bowel sounds active in all 4 quadrants, non-tender, non-distended, tolerating TF Extremities: warm/dry, no edema  Skin: no rashes or lesions  Resolved Hospital Problem list   Hypertension  Assessment & Plan:  Acute hypercapnic respiratory failure -pCO2 111 on admission. Continues with bilateral wheezing on exam 1/18 Acute COPD exacerbation, POA Influenza A pneumonia P: Continue ventilator support with lung protective strategies  Wean PEEP and FiO2 for sats greater than 90%. Head of bed elevated 30 degrees. Plateau pressures less than 30 cm H20.  Follow intermittent chest x-ray and ABG.   SAT/SBT as tolerated, mentation preclude extubation  Ensure adequate pulmonary hygiene  Follow cultures  VAP bundle in place  PAD protocol Continue scheduled bronchodilators IV steroids Continue Tamiflu Trend CBC and fever curve  Hyponatremia P: Trend Bment  Mild acute kidney injury -Creatinine on admission 0.89 with GFR greater than 60, creatinine 1.18 with GFR P: Follow renal function  Monitor  urine output Trend Bmet Avoid nephrotoxins Ensure adequate renal perfusion   Secondary erythrocytosis/polycythemia -Likely multifactorial: secondary to COPD/pulmonary disease, possible chronic hypoxia and long-term tobacco abuse. P: Trend CBC    Best Practice (right click and "Reselect all SmartList Selections" daily)   Diet/type: tubefeeds DVT prophylaxis LMWH Pressure ulcer(s): N/A GI prophylaxis: H2B Lines: N/A Foley:  Yes, and it is still needed Code Status:  full code Last date of multidisciplinary goals of care discussion [case discussed with ER RN and RT 1/17]  Critical care time:   CRITICAL CARE Performed by: Kaitelyn Jamison D. Harris   Total critical care time: 40 minutes  Critical care time Michele [name] exclusive of separately billable procedures and treating other patients.  Critical care Michele [name] necessary to treat or prevent imminent or life-threatening deterioration.  Critical care Michele [name] time spent personally by me on the following activities: development of treatment plan with patient and/or surrogate as well as nursing, discussions with consultants, evaluation of patient's response to treatment, examination of patient, obtaining history from patient or surrogate, ordering and performing treatments and interventions, ordering and review of laboratory studies, ordering and review of radiographic studies, pulse oximetry and re-evaluation of patient's condition.  Chauntel Windsor D. Harris, NP-C [facility] Personal contact information can be found on Amion  If no contact or response made please call [phone] 04/07/2023, 12:08 PM

## 2023-04-08 ENCOUNTER — Inpatient Hospital Stay (HOSPITAL_COMMUNITY): Payer: 59

## 2023-04-08 DIAGNOSIS — I959 Hypotension, unspecified: Secondary | ICD-10-CM | POA: Diagnosis not present

## 2023-04-08 DIAGNOSIS — J9602 Acute respiratory failure with hypercapnia: Secondary | ICD-10-CM | POA: Diagnosis not present

## 2023-04-08 DIAGNOSIS — J09X1 Influenza due to identified novel influenza A virus with pneumonia: Secondary | ICD-10-CM | POA: Diagnosis not present

## 2023-04-08 DIAGNOSIS — J441 Chronic obstructive pulmonary disease with (acute) exacerbation: Secondary | ICD-10-CM | POA: Diagnosis not present

## 2023-04-08 LAB — POCT I-STAT 7, (LYTES, BLD GAS, ICA,H+H)
Acid-Base Excess: 7 mmol/L — ABNORMAL HIGH (ref 0.0–2.0)
Acid-Base Excess: 7 mmol/L — ABNORMAL HIGH (ref 0.0–2.0)
Bicarbonate: 35.8 mmol/L — ABNORMAL HIGH (ref 20.0–28.0)
Bicarbonate: 37.4 mmol/L — ABNORMAL HIGH (ref 20.0–28.0)
Calcium, Ion: 1.22 mmol/L (ref 1.15–1.40)
Calcium, Ion: 1.17 mmol/L (ref 1.15–1.40)
HCT: 49 % — ABNORMAL HIGH (ref 36.0–46.0)
HCT: 48 % — ABNORMAL HIGH (ref 36.0–46.0)
Hemoglobin: 16.7 g/dL — ABNORMAL HIGH (ref 12.0–15.0)
Hemoglobin: 16.3 g/dL — ABNORMAL HIGH (ref 12.0–15.0)
O2 Saturation: 88 %
O2 Saturation: 88 %
Patient temperature: 98.5
Potassium: 3.7 mmol/L (ref 3.5–5.1)
Potassium: 4.9 mmol/L (ref 3.5–5.1)
Sodium: 136 mmol/L (ref 135–145)
Sodium: 136 mmol/L (ref 135–145)
TCO2: 38 mmol/L — ABNORMAL HIGH (ref 22–32)
TCO2: 40 mmol/L — ABNORMAL HIGH (ref 22–32)
pCO2 arterial: 63.7 mm[Hg] — ABNORMAL HIGH (ref 32–48)
pCO2 arterial: 77.6 mm[Hg] (ref 32–48)
pH, Arterial: 7.358 (ref 7.35–7.45)
pH, Arterial: 7.291 — ABNORMAL LOW (ref 7.35–7.45)
pO2, Arterial: 59 mm[Hg] — ABNORMAL LOW (ref 83–108)
pO2, Arterial: 64 mm[Hg] — ABNORMAL LOW (ref 83–108)

## 2023-04-08 LAB — GLUCOSE, CAPILLARY
Glucose-Capillary: 230 mg/dL — ABNORMAL HIGH (ref 70–99)
Glucose-Capillary: 300 mg/dL — ABNORMAL HIGH (ref 70–99)
Glucose-Capillary: 127 mg/dL — ABNORMAL HIGH (ref 70–99)
Glucose-Capillary: 163 mg/dL — ABNORMAL HIGH (ref 70–99)
Glucose-Capillary: 227 mg/dL — ABNORMAL HIGH (ref 70–99)
Glucose-Capillary: 184 mg/dL — ABNORMAL HIGH (ref 70–99)

## 2023-04-08 LAB — CBC
HCT: 46.2 % — ABNORMAL HIGH (ref 36.0–46.0)
Hemoglobin: 15.2 g/dL — ABNORMAL HIGH (ref 12.0–15.0)
MCH: 31.2 pg (ref 26.0–34.0)
MCHC: 32.9 g/dL (ref 30.0–36.0)
MCV: 94.9 fL (ref 80.0–100.0)
Platelets: 215 10*3/uL (ref 150–400)
RBC: 4.87 MIL/uL (ref 3.87–5.11)
RDW: 13.9 % (ref 11.5–15.5)
WBC: 11.3 10*3/uL — ABNORMAL HIGH (ref 4.0–10.5)
nRBC: 0 % (ref 0.0–0.2)

## 2023-04-08 LAB — BASIC METABOLIC PANEL
Anion gap: 13 (ref 5–15)
BUN: 26 mg/dL — ABNORMAL HIGH (ref 6–20)
CO2: 27 mmol/L (ref 22–32)
Calcium: 8.8 mg/dL — ABNORMAL LOW (ref 8.9–10.3)
Chloride: 93 mmol/L — ABNORMAL LOW (ref 98–111)
Creatinine, Ser: 0.66 mg/dL (ref 0.44–1.00)
GFR, Estimated: >60 mL/min (ref >60)
Glucose, Bld: 295 mg/dL — ABNORMAL HIGH (ref 70–99)
Potassium: 4.2 mmol/L (ref 3.5–5.1)
Sodium: 133 mmol/L — ABNORMAL LOW (ref 135–145)

## 2023-04-08 LAB — PHOSPHORUS
Phosphorus: 1.7 mg/dL — ABNORMAL LOW (ref 2.5–4.6)
Phosphorus: 5 mg/dL — ABNORMAL HIGH (ref 2.5–4.6)

## 2023-04-08 LAB — MAGNESIUM
Magnesium: 3 mg/dL — ABNORMAL HIGH (ref 1.7–2.4)
Magnesium: 3.3 mg/dL — ABNORMAL HIGH (ref 1.7–2.4)

## 2023-04-08 LAB — TRIGLYCERIDES: Triglycerides: 541 mg/dL — ABNORMAL HIGH (ref <150)

## 2023-04-08 MED ORDER — FUROSEMIDE 10 MG/ML IJ SOLN
20.0000 mg | Freq: Once | INTRAMUSCULAR | Status: AC
  Administered 2023-04-08: 20 mg via INTRAVENOUS
  Filled 2023-04-08: qty 2

## 2023-04-08 MED ORDER — POTASSIUM & SODIUM PHOSPHATES 280-160-250 MG PO PACK
2.0000 | PACK | ORAL | Status: AC
  Administered 2023-04-08 (×4): 2
  Filled 2023-04-08 (×3): qty 2

## 2023-04-08 MED ORDER — METHYLPREDNISOLONE SODIUM SUCC 40 MG IJ SOLR
40.0000 mg | Freq: Two times a day (BID) | INTRAMUSCULAR | Status: DC
  Administered 2023-04-08 – 2023-04-09 (×2): 40 mg via INTRAVENOUS
  Filled 2023-04-08 (×2): qty 1

## 2023-04-08 MED ORDER — FENTANYL CITRATE PF 50 MCG/ML IJ SOSY
PREFILLED_SYRINGE | INTRAMUSCULAR | Status: AC
  Filled 2023-04-08: qty 2

## 2023-04-08 MED ORDER — INSULIN GLARGINE-YFGN 100 UNIT/ML ~~LOC~~ SOLN
10.0000 [IU] | Freq: Every day | SUBCUTANEOUS | Status: DC
  Administered 2023-04-08 – 2023-04-10 (×3): 10 [IU] via SUBCUTANEOUS
  Filled 2023-04-08 (×4): qty 0.1

## 2023-04-08 MED ORDER — FENTANYL CITRATE PF 50 MCG/ML IJ SOSY
100.0000 ug | PREFILLED_SYRINGE | Freq: Once | INTRAMUSCULAR | Status: AC
Start: 2023-04-08 — End: 2023-04-08
  Administered 2023-04-08: 100 ug via INTRAVENOUS

## 2023-04-08 MED ORDER — POTASSIUM & SODIUM PHOSPHATES 280-160-250 MG PO PACK: 2.0000 | PACK | ORAL | Status: DC

## 2023-04-08 NOTE — Progress Notes (Signed)
Critical ABG results given to Dr Ardeth Perfect at 1616 pH 7.29 pCO2 77 pO2  64 HCO3  37.4

## 2023-04-08 NOTE — Progress Notes (Signed)
ABG attempted 2x unsuccessfully. I have asked for another RT to obtain specimen.

## 2023-04-08 NOTE — Progress Notes (Signed)
NAME:  Michele [name], MRN:  [id number], DOB:  1971-03-15, LOS: 2 ADMISSION DATE:  04/06/2023, CONSULTATION DATE:  04/06/2023 REFERRING MD:  Dr. Margarita Grizzle, CHIEF COMPLAINT:  respiratory failure   History of Present Illness:  [name] presented to [facility] emergency department via [facility] EMS with complaints of respiratory distress.  She apparently reported shortness of breath x 2 days prior to presentation.  Per EMS, the patient was able to only speak in short sentences and subsequently became more short of breath after being loaded into the ambulance, down to 1 word sentences.  EMS administered Solu-Medrol 125 mg IV, magnesium 2 g IV and DuoNeb aerosols.  She arrived in the emergency department with demonstrably labored breathing and tachypnea.  In the ER, the patient was supported with BiPAP; however, she failed to improve.  ABG drawn at 1430: 7.12/111/345.  She was also found to be positive for influenza A infection.  Ultimately, decision was made by the ER staff to endotracheally intubate the patient.  PCCM service was called to admit the patient to the ICU.  Case was discussed with the ER nurse at bedside, who reported that the patient did require multiple boluses of propofol for sedation and subsequent intubation.  This resulted in hypotension, which prompted initiation of peripheral norepinephrine infusion.  Significant Hospital Events: Including procedures, antibiotic start and stop dates in addition to other pertinent events   1/17 Intubated in ER.  Found to be FLU A +.  Started on aerosols, steroids and Tamiflu. 1/18 pCO2 improved on a.m. ABG, no issues overnight.  Remains on deep sedation with continued faint bilateral wheezing on exam 1/19 Tolerated SBT this am but became agitated when having a BM, sedated again   Interim History / Subjective:  Seen lying in bed stated she needed to have a BM on vent, became agitated   Objective   Blood  pressure 129/75, pulse 79, temperature 98 F (36.7 C), temperature source Axillary, resp. rate 16, height 5\' 2"  (1.575 m), weight 93.5 kg, last menstrual period 03/20/2009, SpO2 96%.    Vent Mode: PSV;CPAP FiO2 (%):  [50 %] 50 % Set Rate:  [20 bmp-30 bmp] 30 bmp Vt Set:  [450 mL] 450 mL PEEP:  [5 cmH20] 5 cmH20 Pressure Support:  [0 cmH20] 0 cmH20 Plateau Pressure:  [20 cmH20-25 cmH20] 25 cmH20   Intake/Output Summary (Last 24 hours) at 04/08/2023 0931 Last data filed at 04/08/2023 0800 Gross per 24 hour  Intake 2002.91 ml  Output 1250 ml  Net 752.91 ml   Filed Weights   04/06/23 1700 04/07/23 0500 04/08/23 0500  Weight: 91.6 kg 94.4 kg 93.5 kg    Examination: General: Acute on chronic ill-appearing middle-aged female lying in bed on mechanical ventilation in no acute distress but significantly agitated HEENT: ETT, MM pink/moist, PERRL,  Neuro: Following commands on ventilator, nonfocal CV: s1s2 regular rate and rhythm, no murmur, rubs, or gallops,  PULM: Clear to auscultation bilaterally, no increased work of breathing, no added breath sounds, tolerated SBT improved agitation GI: soft, bowel sounds active in all 4 quadrants, non-tender, non-distended, tolerating TF Extremities: warm/dry, no edema  Skin: no rashes or lesions  Resolved Hospital Problem list   Hypertension AKI  Assessment & Plan:  Acute hypercapnic respiratory failure -pCO2 111 on admission. Continues with bilateral wheezing on exam 1/18 Acute COPD exacerbation, POA Influenza A pneumonia P: Tolerating SBT this a.m. but quickly became agitated will try again later to extubate  Continue  ventilator support with lung protective strategies  Wean PEEP and FiO2 for sats greater than 90%. Head of bed elevated 30 degrees. Plateau pressures less than 30 cm H20.  Follow intermittent chest x-ray and ABG.   SAT/SBT as tolerated, mentation preclude extubation  Ensure adequate pulmonary hygiene  Follow cultures  VAP  bundle in place  PAD protocol Continue Tamiflu Wean steroids Continue to trend CBC and fever curve to monitor for potential development of superimposed bacterial infection  Hyponatremia P: Trend BMP  Secondary erythrocytosis/polycythemia -Likely multifactorial: secondary to COPD/pulmonary disease, possible chronic hypoxia and long-term tobacco abuse. P: Trend CBC Cessation education when appropriate  Prediabetes with hyperglycemia P: SSI CBG goal 140-180 CBG checks every 4 Monitor need for long acting insulin, hoping weaning steroids will help with hyperglycemia    Best Practice (right click and "Reselect all SmartList Selections" daily)   Diet/type: tubefeeds DVT prophylaxis LMWH Pressure ulcer(s): N/A GI prophylaxis: H2B Lines: N/A Foley:  Yes, and it is still needed Code Status:  full code Last date of multidisciplinary goals of care discussion [case discussed with ER RN and RT 1/17]  Critical care time:   CRITICAL CARE Performed by: [name]   Total critical care time: 37 minutes  Critical care time was exclusive of separately billable procedures and treating other patients.  Critical care was necessary to treat or prevent imminent or life-threatening deterioration.  Critical care was time spent personally by me on the following activities: development of treatment plan with patient and/or surrogate as well as nursing, discussions with consultants, evaluation of patient's response to treatment, examination of patient, obtaining history from patient or surrogate, ordering and performing treatments and interventions, ordering and review of laboratory studies, ordering and review of radiographic studies, pulse oximetry and re-evaluation of patient's condition.  Meia Emley D. Harris, NP-C [facility] Personal contact information can be found on Amion  If no contact or response made please call [phone] 04/08/2023, 9:31 AM

## 2023-04-09 DIAGNOSIS — J9622 Acute and chronic respiratory failure with hypercapnia: Secondary | ICD-10-CM

## 2023-04-09 DIAGNOSIS — J9621 Acute and chronic respiratory failure with hypoxia: Secondary | ICD-10-CM | POA: Diagnosis not present

## 2023-04-09 DIAGNOSIS — J09X1 Influenza due to identified novel influenza A virus with pneumonia: Secondary | ICD-10-CM | POA: Diagnosis not present

## 2023-04-09 DIAGNOSIS — R7303 Prediabetes: Secondary | ICD-10-CM

## 2023-04-09 DIAGNOSIS — J441 Chronic obstructive pulmonary disease with (acute) exacerbation: Secondary | ICD-10-CM | POA: Diagnosis not present

## 2023-04-09 LAB — COMPREHENSIVE METABOLIC PANEL
ALT: 19 U/L (ref 0–44)
AST: 19 U/L (ref 15–41)
Albumin: 3.4 g/dL — ABNORMAL LOW (ref 3.5–5.0)
Alkaline Phosphatase: 46 U/L (ref 38–126)
Anion gap: 8 (ref 5–15)
BUN: 27 mg/dL — ABNORMAL HIGH (ref 6–20)
CO2: 31 mmol/L (ref 22–32)
Calcium: 8.5 mg/dL — ABNORMAL LOW (ref 8.9–10.3)
Chloride: 98 mmol/L (ref 98–111)
Creatinine, Ser: 0.7 mg/dL (ref 0.44–1.00)
GFR, Estimated: >60 mL/min (ref >60)
Glucose, Bld: 266 mg/dL — ABNORMAL HIGH (ref 70–99)
Potassium: 4.8 mmol/L (ref 3.5–5.1)
Sodium: 137 mmol/L (ref 135–145)
Total Bilirubin: 0.4 mg/dL (ref 0.0–1.2)
Total Protein: 6.7 g/dL (ref 6.5–8.1)

## 2023-04-09 LAB — POCT I-STAT 7, (LYTES, BLD GAS, ICA,H+H)
Acid-Base Excess: 8 mmol/L — ABNORMAL HIGH (ref 0.0–2.0)
Bicarbonate: 38.2 mmol/L — ABNORMAL HIGH (ref 20.0–28.0)
Calcium, Ion: 1.19 mmol/L (ref 1.15–1.40)
HCT: 47 % — ABNORMAL HIGH (ref 36.0–46.0)
Hemoglobin: 16 g/dL — ABNORMAL HIGH (ref 12.0–15.0)
O2 Saturation: 93 %
Patient temperature: 97.5
Potassium: 4.6 mmol/L (ref 3.5–5.1)
Sodium: 138 mmol/L (ref 135–145)
TCO2: 40 mmol/L — ABNORMAL HIGH (ref 22–32)
pCO2 arterial: 74 mm[Hg] (ref 32–48)
pH, Arterial: 7.317 — ABNORMAL LOW (ref 7.35–7.45)
pO2, Arterial: 75 mm[Hg] — ABNORMAL LOW (ref 83–108)

## 2023-04-09 LAB — CBC
HCT: 47.8 % — ABNORMAL HIGH (ref 36.0–46.0)
Hemoglobin: 15.4 g/dL — ABNORMAL HIGH (ref 12.0–15.0)
MCH: 31 pg (ref 26.0–34.0)
MCHC: 32.2 g/dL (ref 30.0–36.0)
MCV: 96.4 fL (ref 80.0–100.0)
Platelets: 229 10*3/uL (ref 150–400)
RBC: 4.96 MIL/uL (ref 3.87–5.11)
RDW: 14.6 % (ref 11.5–15.5)
WBC: 13.9 10*3/uL — ABNORMAL HIGH (ref 4.0–10.5)
nRBC: 0 % (ref 0.0–0.2)

## 2023-04-09 LAB — GLUCOSE, CAPILLARY
Glucose-Capillary: 263 mg/dL — ABNORMAL HIGH (ref 70–99)
Glucose-Capillary: 229 mg/dL — ABNORMAL HIGH (ref 70–99)
Glucose-Capillary: 174 mg/dL — ABNORMAL HIGH (ref 70–99)
Glucose-Capillary: 131 mg/dL — ABNORMAL HIGH (ref 70–99)
Glucose-Capillary: 105 mg/dL — ABNORMAL HIGH (ref 70–99)
Glucose-Capillary: 164 mg/dL — ABNORMAL HIGH (ref 70–99)

## 2023-04-09 LAB — C DIFFICILE QUICK SCREEN W PCR REFLEX
C Diff antigen: NEGATIVE
C Diff interpretation: NOT DETECTED
C Diff toxin: NEGATIVE

## 2023-04-09 LAB — PHOSPHORUS: Phosphorus: 3.4 mg/dL (ref 2.5–4.6)

## 2023-04-09 LAB — TRIGLYCERIDES
Triglycerides: 445 mg/dL — ABNORMAL HIGH (ref <150)
Triglycerides: 295 mg/dL — ABNORMAL HIGH (ref <150)

## 2023-04-09 LAB — MAGNESIUM: Magnesium: 2.9 mg/dL — ABNORMAL HIGH (ref 1.7–2.4)

## 2023-04-09 LAB — PROCALCITONIN: Procalcitonin: <0.1 ng/mL

## 2023-04-09 MED ORDER — BUDESONIDE 0.25 MG/2ML IN SUSP
0.2500 mg | Freq: Two times a day (BID) | RESPIRATORY_TRACT | Status: DC
  Administered 2023-04-09 – 2023-04-10 (×3): 0.25 mg via RESPIRATORY_TRACT
  Filled 2023-04-09 (×3): qty 2

## 2023-04-09 MED ORDER — PROPOFOL 1000 MG/100ML IV EMUL
0.0000 ug/kg/min | INTRAVENOUS | Status: DC
Start: 2023-04-09 — End: 2023-04-11
  Administered 2023-04-09 – 2023-04-10 (×6): 50 ug/kg/min via INTRAVENOUS
  Administered 2023-04-10: 30 ug/kg/min via INTRAVENOUS
  Administered 2023-04-10: 50 ug/kg/min via INTRAVENOUS
  Administered 2023-04-10: 30 ug/kg/min via INTRAVENOUS
  Administered 2023-04-10 – 2023-04-11 (×3): 50 ug/kg/min via INTRAVENOUS
  Filled 2023-04-09 (×6): qty 100
  Filled 2023-04-09: qty 200
  Filled 2023-04-09 (×4): qty 100

## 2023-04-09 MED ORDER — ACETAZOLAMIDE 250 MG PO TABS: 500.0000 mg | ORAL_TABLET | Freq: Two times a day (BID) | ORAL | Status: DC

## 2023-04-09 MED ORDER — ARFORMOTEROL TARTRATE 15 MCG/2ML IN NEBU
15.0000 ug | INHALATION_SOLUTION | Freq: Two times a day (BID) | RESPIRATORY_TRACT | Status: DC
  Administered 2023-04-09 – 2023-04-10 (×3): 15 ug via RESPIRATORY_TRACT
  Filled 2023-04-09 (×3): qty 2

## 2023-04-09 MED ORDER — REVEFENACIN 175 MCG/3ML IN SOLN
175.0000 ug | Freq: Every day | RESPIRATORY_TRACT | Status: DC
  Administered 2023-04-09 – 2023-04-10 (×2): 175 ug via RESPIRATORY_TRACT
  Filled 2023-04-09 (×2): qty 3

## 2023-04-09 MED ORDER — METHYLPREDNISOLONE SODIUM SUCC 40 MG IJ SOLR
40.0000 mg | Freq: Two times a day (BID) | INTRAMUSCULAR | Status: AC
  Administered 2023-04-09: 40 mg via INTRAVENOUS
  Filled 2023-04-09: qty 1

## 2023-04-09 MED ORDER — DEXTROSE 50 % IV SOLN
INTRAVENOUS | Status: AC
  Administered 2023-04-09: 25 mL
  Filled 2023-04-09: qty 50

## 2023-04-09 MED ORDER — ACETAZOLAMIDE 250 MG PO TABS
500.0000 mg | ORAL_TABLET | Freq: Two times a day (BID) | ORAL | Status: AC
  Administered 2023-04-09 (×2): 500 mg via ORAL
  Filled 2023-04-09 (×2): qty 2

## 2023-04-09 MED ORDER — METHYLPREDNISOLONE SODIUM SUCC 40 MG IJ SOLR
40.0000 mg | Freq: Every day | INTRAMUSCULAR | Status: DC
  Administered 2023-04-10: 40 mg via INTRAVENOUS
  Filled 2023-04-09: qty 1

## 2023-04-09 NOTE — Progress Notes (Signed)
Transition of Care [facility]) - Inpatient Brief Assessment   Patient Details  Name: Michele [name] MRN: [id number] Date of Birth: 1971/01/09  Transition of [facility]) CM/SW Contact:    Ronny Bacon, RN Phone Number: 04/09/2023, 3:56 PM   Clinical Narrative: Patient from home with respiratory distress, positive for Flu and currently intubated.   Transition of Care Asessment: Insurance and Status: (P) Insurance coverage has been reviewed Patient has primary care physician: (P) No Home environment has been reviewed: (P) Home Prior level of function:: (P) Independent Prior/Current Home Services: (P) No current home services Social Drivers of Health Review: (P) SDOH reviewed no interventions necessary Readmission risk has been reviewed: (P) Yes Transition of care needs: (P) no transition of care needs at this time

## 2023-04-09 NOTE — Progress Notes (Signed)
Patient sitting upright in bed with labored respiratory effort her lips and fingers are cyanotic  . Dr Merrily Pew called to bedside . Patient placed on wean mode per RT and Fentanyl and propofol , which had previously been weaned for wakeup assessment ,turned off per Dr Merrily Pew and Precedex increased to 1.2 per Dr Diannia Ruder orders . Patient continued to to be very agitated and restless and cyanotic mouthing that she could not breathe. Dr Merrily Pew called to the bedside again. Propofol and fentanyl restarted and precedex stopped per Dr Diannia Ruder orders and placed back on previous ventilator settings . Patient continues to have accessory abdominal breathing pattern despite sedation .

## 2023-04-09 NOTE — Inpatient Diabetes Management (Signed)
Inpatient Diabetes Program Recommendations  AACE/ADA: New Consensus Statement on Inpatient Glycemic Control   Target Ranges:  Prepandial:   less than 140 mg/dL      Peak postprandial:   less than 180 mg/dL (1-2 hours)      Critically ill patients:  140 - 180 mg/dL    Latest Reference Range & Units 04/08/23 07:37 04/08/23 11:27 04/08/23 15:42 04/08/23 19:48 04/08/23 23:29 04/09/23 03:26 04/09/23 07:47  Glucose-Capillary 70 - 99 mg/dL 644 (H) 034 (H) 742 (H) 227 (H) 184 (H) 263 (H) 229 (H)   Review of Glycemic Control  Diabetes history: NO Outpatient Diabetes medications: NA Current orders for Inpatient glycemic control: Semglee 10 units at bedtime, Novolog 0-20 units Q4H; Solumedrol 40 mg Q12H, Vital @ 50 ml/hr  Inpatient Diabetes Program Recommendations:    Insulin: If steroids are continued as ordered, please consider increasing Semglee to 15 units at bedtime.  Please consider ordering Novolog 4 units Q4H for tube feeding coverage. If tube feeding is stopped or held then Novolog tube feeding coverage should also be stopped or held.  Thanks, Orlando Penner, RN, MSN, CDCES Diabetes Coordinator Inpatient Diabetes Program [phone] (Team Pager from 8am to 5pm)

## 2023-04-09 NOTE — Progress Notes (Signed)
NAME:  Michele [name], MRN:  [id number], DOB:  03/06/1971, LOS: 3 ADMISSION DATE:  04/06/2023, CONSULTATION DATE:  04/06/2023 REFERRING MD:  Dr. Margarita Grizzle, CHIEF COMPLAINT:  respiratory failure   History of Present Illness:  [name] presented to [facility] emergency department via [facility] EMS with complaints of respiratory distress.  She apparently reported shortness of breath x 2 days prior to presentation.  Per EMS, the patient was able to only speak in short sentences and subsequently became more short of breath after being loaded into the ambulance, down to 1 word sentences.  EMS administered Solu-Medrol 125 mg IV, magnesium 2 g IV and DuoNeb aerosols.  She arrived in the emergency department with demonstrably labored breathing and tachypnea.  In the ER, the patient was supported with BiPAP; however, she failed to improve.  ABG drawn at 1430: 7.12/111/345.  She was also found to be positive for influenza A infection.  Ultimately, decision was made by the ER staff to endotracheally intubate the patient.  PCCM service was called to admit the patient to the ICU.  Case was discussed with the ER nurse at bedside, who reported that the patient did require multiple boluses of propofol for sedation and subsequent intubation.  This resulted in hypotension, which prompted initiation of peripheral norepinephrine infusion.  Significant Hospital Events: Including procedures, antibiotic start and stop dates in addition to other pertinent events   1/17 Intubated in ER.  Found to be FLU A +.  Started on aerosols, steroids and Tamiflu. 1/18 pCO2 improved on a.m. ABG, no issues overnight.  Remains on deep sedation with continued faint bilateral wheezing on exam 1/19 Tolerated SBT this am but became agitated when having a BM, sedated again  1/20: again on SBT but then agitated, hypoxic, cyanotic. Placed back on rate and sedation.   Interim History / Subjective:  Vented,  moderate sedation. Laying in bed in no acute distress. Awakes to voice. Intermittently follows commands.   Objective   Blood pressure 132/71, pulse 94, temperature 98.9 F (37.2 C), temperature source Axillary, resp. rate 20, height 5\' 2"  (1.575 m), weight 97.3 kg, last menstrual period 03/20/2009, SpO2 94%.    Vent Mode: PRVC FiO2 (%):  [50 %-100 %] 60 % Set Rate:  [8 bmp-30 bmp] 24 bmp Vt Set:  [400 mL-420 mL] 420 mL PEEP:  [5 cmH20] 5 cmH20 Pressure Support:  [0 cmH20-15 cmH20] 15 cmH20 Plateau Pressure:  [15 cmH20-24 cmH20] 21 cmH20   Intake/Output Summary (Last 24 hours) at 04/09/2023 0815 Last data filed at 04/09/2023 0800 Gross per 24 hour  Intake 2345.15 ml  Output 1601 ml  Net 744.15 ml   Filed Weights   04/07/23 0500 04/08/23 0500 04/09/23 0449  Weight: 94.4 kg 93.5 kg 97.3 kg    Examination: General: acute on chronically ill appearing middle-aged female. Laying in bed. No acute distress. Vented  HEENT: [place]/AT, anicteric sclera, mucous membranes moist, PERRLA, ETT Neuro: sedated. Wakes to voice. Follows commands intermittently. Non focal exam CV: s1s2, rrr, sinus, no murmur, rub, gallop  PULM: vented, clear bilaterally without wheezing, rhonchi, rales. Strong cough and gag GI: rounded, soft, non-tender, non-distended, tube feeds Extremities: warm/dry, no edema  Skin: no rashes or lesions  Resolved Hospital Problem list   Hypertension AKI  Assessment & Plan:  Influenza A pneumonia  Acute on chronic hypoxic hypercapnic respiratory failure 2/2 flu A pneumonia  Acute on chronic COPD exacerbation, POA Obstructive sleep apnea on CPAP at night  Resp panel + influenza A. Presenting ABG pH 7.12, pCO2 111. Unclear if she wears home O2. CPAP at night.  P: - attempted SBT this AM with subsequent agitation, cyanosis, tachypnea. Placed back on rate  - tamiflu 75mg  BID for 5 days - con't solumedrol 40mg  q12  - add triple therapy for underlying COPD - prn duoneb - full  mechanical vent support - lung protective ventilation 6-8cc/kg Vt - VAP and PAD bundle in place  - titrate FiO2 to sat goal >92  - maintain peak/plats <30, driving pressures <96   - daily SAT/SBT  Secondary erythrocytosis/polycythemia -Likely multifactorial: secondary to COPD/pulmonary disease, possible chronic hypoxia and long-term tobacco abuse. P: - trend CBC - cessation education when appropriate  Prediabetes with hyperglycemia P: - cbg q4h  - SSI - semglee 10 U daily   Best Practice (right click and "Reselect all SmartList Selections" daily)   Diet/type: tubefeeds DVT prophylaxis LMWH Pressure ulcer(s): N/A GI prophylaxis: H2B Lines: N/A Foley:  Yes, and it is still needed Code Status:  full code Last date of multidisciplinary goals of care discussion [case discussed with ER RN and RT 1/17]  Critical care time:   CRITICAL CARE Performed by: Cristopher Peru   Total critical care time: 37 minutes  Critical care time was exclusive of separately billable procedures and treating other patients.  Critical care was necessary to treat or prevent imminent or life-threatening deterioration.  Critical care was time spent personally by me on the following activities: development of treatment plan with patient and/or surrogate as well as nursing, discussions with consultants, evaluation of patient's response to treatment, examination of patient, obtaining history from patient or surrogate, ordering and performing treatments and interventions, ordering and review of laboratory studies, ordering and review of radiographic studies, pulse oximetry and re-evaluation of patient's condition.  Whitney D. Harris, NP-C [facility] Personal contact information can be found on Amion  If no contact or response made please call [phone] 04/09/2023, 8:15 AM

## 2023-04-09 NOTE — Progress Notes (Signed)
RT with RN assistance bag lavaged patient due to think mucus plugs causing low MVe alarms. Patient's VTs, MVe, and PIP recovered. HME changed.

## 2023-04-10 ENCOUNTER — Inpatient Hospital Stay (HOSPITAL_COMMUNITY): Payer: 59

## 2023-04-10 DIAGNOSIS — J189 Pneumonia, unspecified organism: Secondary | ICD-10-CM | POA: Diagnosis not present

## 2023-04-10 DIAGNOSIS — J9621 Acute and chronic respiratory failure with hypoxia: Secondary | ICD-10-CM | POA: Diagnosis not present

## 2023-04-10 DIAGNOSIS — G4733 Obstructive sleep apnea (adult) (pediatric): Secondary | ICD-10-CM

## 2023-04-10 DIAGNOSIS — J441 Chronic obstructive pulmonary disease with (acute) exacerbation: Secondary | ICD-10-CM | POA: Diagnosis not present

## 2023-04-10 DIAGNOSIS — J9622 Acute and chronic respiratory failure with hypercapnia: Secondary | ICD-10-CM | POA: Diagnosis not present

## 2023-04-10 LAB — POCT I-STAT 7, (LYTES, BLD GAS, ICA,H+H)
Acid-Base Excess: 10 mmol/L — ABNORMAL HIGH (ref 0.0–2.0)
Acid-Base Excess: 10 mmol/L — ABNORMAL HIGH (ref 0.0–2.0)
Acid-Base Excess: 9 mmol/L — ABNORMAL HIGH (ref 0.0–2.0)
Bicarbonate: 41.8 mmol/L — ABNORMAL HIGH (ref 20.0–28.0)
Bicarbonate: 41.4 mmol/L — ABNORMAL HIGH (ref 20.0–28.0)
Bicarbonate: 39.2 mmol/L — ABNORMAL HIGH (ref 20.0–28.0)
Calcium, Ion: 1.23 mmol/L (ref 1.15–1.40)
Calcium, Ion: 1.25 mmol/L (ref 1.15–1.40)
Calcium, Ion: 1.23 mmol/L (ref 1.15–1.40)
HCT: 47 % — ABNORMAL HIGH (ref 36.0–46.0)
HCT: 46 % (ref 36.0–46.0)
HCT: 46 % (ref 36.0–46.0)
Hemoglobin: 16 g/dL — ABNORMAL HIGH (ref 12.0–15.0)
Hemoglobin: 15.6 g/dL — ABNORMAL HIGH (ref 12.0–15.0)
Hemoglobin: 15.6 g/dL — ABNORMAL HIGH (ref 12.0–15.0)
O2 Saturation: 88 %
O2 Saturation: 88 %
O2 Saturation: 90 %
Patient temperature: 37.1
Patient temperature: 37
Patient temperature: 37.6
Potassium: 4.6 mmol/L (ref 3.5–5.1)
Potassium: 4.5 mmol/L (ref 3.5–5.1)
Potassium: 4.7 mmol/L (ref 3.5–5.1)
Sodium: 140 mmol/L (ref 135–145)
Sodium: 139 mmol/L (ref 135–145)
Sodium: 139 mmol/L (ref 135–145)
TCO2: 45 mmol/L — ABNORMAL HIGH (ref 22–32)
TCO2: 44 mmol/L — ABNORMAL HIGH (ref 22–32)
TCO2: 42 mmol/L — ABNORMAL HIGH (ref 22–32)
pCO2 arterial: 97 mm[Hg] (ref 32–48)
pCO2 arterial: 92.8 mm[Hg] (ref 32–48)
pCO2 arterial: 83 mm[Hg] (ref 32–48)
pH, Arterial: 7.243 — ABNORMAL LOW (ref 7.35–7.45)
pH, Arterial: 7.257 — ABNORMAL LOW (ref 7.35–7.45)
pH, Arterial: 7.285 — ABNORMAL LOW (ref 7.35–7.45)
pO2, Arterial: 69 mm[Hg] — ABNORMAL LOW (ref 83–108)
pO2, Arterial: 68 mm[Hg] — ABNORMAL LOW (ref 83–108)
pO2, Arterial: 71 mm[Hg] — ABNORMAL LOW (ref 83–108)

## 2023-04-10 LAB — BASIC METABOLIC PANEL
Anion gap: 6 (ref 5–15)
Anion gap: 9 (ref 5–15)
BUN: 18 mg/dL (ref 6–20)
BUN: 21 mg/dL — ABNORMAL HIGH (ref 6–20)
CO2: 37 mmol/L — ABNORMAL HIGH (ref 22–32)
CO2: 35 mmol/L — ABNORMAL HIGH (ref 22–32)
Calcium: 8.7 mg/dL — ABNORMAL LOW (ref 8.9–10.3)
Calcium: 9.1 mg/dL (ref 8.9–10.3)
Chloride: 97 mmol/L — ABNORMAL LOW (ref 98–111)
Chloride: 96 mmol/L — ABNORMAL LOW (ref 98–111)
Creatinine, Ser: 0.49 mg/dL (ref 0.44–1.00)
Creatinine, Ser: 0.55 mg/dL (ref 0.44–1.00)
GFR, Estimated: >60 mL/min (ref >60)
GFR, Estimated: >60 mL/min (ref >60)
Glucose, Bld: 150 mg/dL — ABNORMAL HIGH (ref 70–99)
Glucose, Bld: 237 mg/dL — ABNORMAL HIGH (ref 70–99)
Potassium: 4.8 mmol/L (ref 3.5–5.1)
Potassium: 4.5 mmol/L (ref 3.5–5.1)
Sodium: 140 mmol/L (ref 135–145)
Sodium: 140 mmol/L (ref 135–145)

## 2023-04-10 LAB — CBC
HCT: 48.4 % — ABNORMAL HIGH (ref 36.0–46.0)
Hemoglobin: 15.1 g/dL — ABNORMAL HIGH (ref 12.0–15.0)
MCH: 31.5 pg (ref 26.0–34.0)
MCHC: 31.2 g/dL (ref 30.0–36.0)
MCV: 101 fL — ABNORMAL HIGH (ref 80.0–100.0)
Platelets: 253 10*3/uL (ref 150–400)
RBC: 4.79 MIL/uL (ref 3.87–5.11)
RDW: 15 % (ref 11.5–15.5)
WBC: 18.5 10*3/uL — ABNORMAL HIGH (ref 4.0–10.5)
nRBC: 0 % (ref 0.0–0.2)

## 2023-04-10 LAB — MAGNESIUM: Magnesium: 2.5 mg/dL — ABNORMAL HIGH (ref 1.7–2.4)

## 2023-04-10 LAB — GLUCOSE, CAPILLARY
Glucose-Capillary: 172 mg/dL — ABNORMAL HIGH (ref 70–99)
Glucose-Capillary: 176 mg/dL — ABNORMAL HIGH (ref 70–99)
Glucose-Capillary: 178 mg/dL — ABNORMAL HIGH (ref 70–99)
Glucose-Capillary: 181 mg/dL — ABNORMAL HIGH (ref 70–99)
Glucose-Capillary: 199 mg/dL — ABNORMAL HIGH (ref 70–99)
Glucose-Capillary: 199 mg/dL — ABNORMAL HIGH (ref 70–99)
Glucose-Capillary: 243 mg/dL — ABNORMAL HIGH (ref 70–99)

## 2023-04-10 LAB — TRIGLYCERIDES: Triglycerides: 374 mg/dL — ABNORMAL HIGH (ref <150)

## 2023-04-10 MED ORDER — STERILE WATER FOR INJECTION IJ SOLN
INTRAMUSCULAR | Status: AC
  Administered 2023-04-10: 5 mL
  Filled 2023-04-10: qty 10

## 2023-04-10 MED ORDER — STERILE WATER FOR INJECTION IJ SOLN
INTRAMUSCULAR | Status: AC
  Administered 2023-04-10: 10 mL
  Filled 2023-04-10: qty 10

## 2023-04-10 MED ORDER — NICOTINE 7 MG/24HR TD PT24
7.0000 mg | MEDICATED_PATCH | Freq: Every day | TRANSDERMAL | Status: DC
Start: 2023-04-10 — End: 2023-04-19
  Administered 2023-04-10 – 2023-04-19 (×10): 7 mg via TRANSDERMAL
  Filled 2023-04-10 (×10): qty 1

## 2023-04-10 MED ORDER — SERTRALINE HCL 50 MG PO TABS
25.0000 mg | ORAL_TABLET | Freq: Every day | ORAL | Status: DC
Start: 2023-04-10 — End: 2023-04-12
  Administered 2023-04-10 – 2023-04-11 (×2): 25 mg
  Filled 2023-04-10 (×2): qty 1

## 2023-04-10 MED ORDER — MIDAZOLAM HCL 2 MG/2ML IJ SOLN
2.0000 mg | INTRAMUSCULAR | Status: DC | PRN
  Administered 2023-04-10 (×2): 2 mg via INTRAVENOUS
  Filled 2023-04-10 (×2): qty 2

## 2023-04-10 MED ORDER — IPRATROPIUM-ALBUTEROL 0.5-2.5 (3) MG/3ML IN SOLN
RESPIRATORY_TRACT | Status: AC
  Filled 2023-04-10: qty 3

## 2023-04-10 MED ORDER — SODIUM CHLORIDE 0.9 % IV SOLN
2.0000 g | Freq: Three times a day (TID) | INTRAVENOUS | Status: DC
  Administered 2023-04-10 – 2023-04-11 (×3): 2 g via INTRAVENOUS
  Filled 2023-04-10 (×3): qty 12.5

## 2023-04-10 MED ORDER — SODIUM CHLORIDE 3 % IN NEBU
4.0000 mL | INHALATION_SOLUTION | Freq: Four times a day (QID) | RESPIRATORY_TRACT | Status: DC
Start: 2023-04-10 — End: 2023-04-10
  Administered 2023-04-10 (×3): 4 mL via RESPIRATORY_TRACT
  Filled 2023-04-10 (×3): qty 15

## 2023-04-10 MED ORDER — IPRATROPIUM-ALBUTEROL 0.5-2.5 (3) MG/3ML IN SOLN
3.0000 mL | RESPIRATORY_TRACT | Status: DC
Start: 2023-04-10 — End: 2023-04-12
  Administered 2023-04-10 – 2023-04-12 (×13): 3 mL via RESPIRATORY_TRACT
  Filled 2023-04-10 (×13): qty 3

## 2023-04-10 MED ORDER — GUAIFENESIN 100 MG/5ML PO LIQD
10.0000 mL | Freq: Four times a day (QID) | ORAL | Status: DC
  Administered 2023-04-10 – 2023-04-11 (×6): 10 mL
  Filled 2023-04-10 (×6): qty 10

## 2023-04-10 MED ORDER — METHYLPREDNISOLONE SODIUM SUCC 40 MG IJ SOLR
40.0000 mg | Freq: Two times a day (BID) | INTRAMUSCULAR | Status: DC
  Administered 2023-04-10 – 2023-04-11 (×3): 40 mg via INTRAVENOUS
  Filled 2023-04-10 (×3): qty 1

## 2023-04-10 MED ORDER — SODIUM CHLORIDE 3 % IN NEBU
4.0000 mL | INHALATION_SOLUTION | Freq: Four times a day (QID) | RESPIRATORY_TRACT | Status: AC
  Administered 2023-04-11 – 2023-04-13 (×9): 4 mL via RESPIRATORY_TRACT
  Filled 2023-04-10: qty 15
  Filled 2023-04-10: qty 4
  Filled 2023-04-10 (×7): qty 15

## 2023-04-10 MED ORDER — ACETAZOLAMIDE SODIUM 500 MG IJ SOLR
500.0000 mg | Freq: Two times a day (BID) | INTRAMUSCULAR | Status: AC
  Administered 2023-04-10 (×2): 500 mg via INTRAVENOUS
  Filled 2023-04-10 (×2): qty 500

## 2023-04-10 NOTE — Progress Notes (Signed)
Critical ABG values given to CCM and vent changes made per instruction.

## 2023-04-10 NOTE — Progress Notes (Addendum)
eLink Physician-Brief Progress Note Patient Name: Michele [name] DOB: Mar 18, 1971 MRN: [id number]   Date of Service  04/10/2023  HPI/Events of Note  Notified of ABG. DW RN and RT. Both say she has not been synchronous with the vent and is tight. Last neb was at midnight. Is on PRVC VT 450, RR 24, peep 5, Fio2 50. O2 sat is 93 . Wakes up quickly while on propofol and fentnayl drips but has room to go up on fentanyl. Versed push given earlier last night. Elevated peak pressure noted especially while waking up. RT notes she has needed lot of suctioning and gets mucus plugs out frequently.   eICU Interventions  Changed versed to hourly PRN as needed pushes Increase fentanyl Needs to be better sedated to be in synch with the vent  Is too awake to consider a paralytic push at this time DW RN and RT Give nebs now as well Recheck abg at 8 am CXR pending     Intervention Category Major Interventions: Respiratory failure - evaluation and management  Quinzell Malcomb G Luba Matzen 04/10/2023, 5:44 AM  Addendum at 625 am - Patient much better after sedation, peak pressure down to 26 on vent. Synchronous at this time. Keep goal o2 sat > 90.

## 2023-04-10 NOTE — Progress Notes (Signed)
eLink Physician-Brief Progress Note Patient Name: Michele [name] DOB: 29-Mar-1970 MRN: [id number]   Date of Service  04/10/2023  HPI/Events of Note  Asking for AM labs  eICU Interventions  ABG, cXr ordered        Ranee Gosselin 04/10/2023, 12:36 AM

## 2023-04-10 NOTE — Progress Notes (Signed)
NAME:  Michele [name], MRN:  [id number], DOB:  11-May-1970, LOS: 4 ADMISSION DATE:  04/06/2023, CONSULTATION DATE:  04/06/2023 REFERRING MD:  Dr. Margarita Grizzle, CHIEF COMPLAINT:  respiratory failure   History of Present Illness:  [name] presented to [facility] emergency department via [facility] with complaints of respiratory distress.  She apparently reported shortness of breath x 2 days prior to presentation.  Per EMS, the patient was able to only speak in short sentences and subsequently became more short of breath after being loaded into the ambulance, down to 1 word sentences.  EMS administered Solu-Medrol 125 mg IV, magnesium 2 g IV and DuoNeb aerosols.  She arrived in the emergency department with demonstrably labored breathing and tachypnea.  In the ER, the patient was supported with BiPAP; however, she failed to improve.  ABG drawn at 1430: 7.12/111/345.  She was also found to be positive for influenza A infection.  Ultimately, decision was made by the ER staff to endotracheally intubate the patient.  PCCM service was called to admit the patient to the ICU.  Case was discussed with the ER nurse at bedside, who reported that the patient did require multiple boluses of propofol for sedation and subsequent intubation.  This resulted in hypotension, which prompted initiation of peripheral norepinephrine infusion.   Significant Hospital Events: Including procedures, antibiotic start and stop dates in addition to other pertinent events   1/17 Intubated in ER.  Found to be FLU A +.  Started on aerosols, steroids and Tamiflu. 1/18 pCO2 improved on a.m. ABG, no issues overnight.  Remains on deep sedation with continued faint bilateral wheezing on exam 1/19 Tolerated SBT this am but became agitated when having a BM, sedated again  1/20: again on SBT but then agitated, hypoxic, cyanotic. Placed back on rate and sedation.   Interim History / Subjective:  Sedate  on prop/fent On mech vent 50% 8 peep  Objective   Blood pressure (!) 125/90, pulse 75, temperature 98.6 F (37 C), resp. rate 18, height 5\' 2"  (1.575 m), weight 95.5 kg, last menstrual period 03/20/2009, SpO2 94%.    Vent Mode: PRVC FiO2 (%):  [40 %-50 %] 50 % Set Rate:  [24 bmp-28 bmp] 28 bmp Vt Set:  [420 mL-450 mL] 450 mL PEEP:  [5 cmH20-8 cmH20] 8 cmH20 Pressure Support:  [8 cmH20] 8 cmH20 Plateau Pressure:  [17 cmH20-28 cmH20] 28 cmH20   Intake/Output Summary (Last 24 hours) at 04/10/2023 8416 Last data filed at 04/10/2023 0800 Gross per 24 hour  Intake 2202.09 ml  Output 1600 ml  Net 602.09 ml   Filed Weights   04/08/23 0500 04/09/23 0449 04/10/23 0500  Weight: 93.5 kg 97.3 kg 95.5 kg    Examination: General:  critically ill appearing on mech vent HEENT: MM pink/moist; ETT in place Neuro: sedate; opens eyes to voice; moves ext spontaneously CV: s1s2, RRR , no m/r/g PULM:  dim wheezing BS bilaterally; on mech vent PRVC GI: soft, bsx4 active  Extremities: warm/dry, no edema  Skin: no rashes or lesions   Resolved Hospital Problem list   Hypertension AKI  Assessment & Plan:  Influenza A pneumonia  Acute on chronic hypoxic hypercapnic respiratory failure 2/2 flu A pneumonia  Acute on chronic COPD exacerbation, POA Obstructive sleep apnea on CPAP at night  Resp panel + influenza A. Presenting ABG pH 7.12, pCO2 111. Unclear if she wears home O2. CPAP at night.  P: -abg still w/ resp acidosis; RR  increased to 28; repeat abg later today -LTVV strategy with tidal volumes of 6-8 cc/kg ideal body weight -Goal plateau pressures less than 30 and driving pressures less than 15 -Wean PEEP/FiO2 for SpO2 >92% -VAP bundle in place -Daily SAT and SBT -PAD protocol in place -wean sedation for RASS goal 0 to -1 -cont tamiflu -pct <0.1 hold on abx -steroids q12 -cont triple therapy nebs -adding hypertonic saline nebs and cpt w/ thick secretions  Secondary  erythrocytosis/polycythemia -Likely multifactorial: secondary to COPD/pulmonary disease, possible chronic hypoxia and long-term tobacco abuse. P: -trend cbc  Prediabetes with hyperglycemia P: -ssi and cbg monitoring -cont basal  Tobacco abuse Plan: -nicotine patch  Anxiety Plan: -resume sertraline   Best Practice (right click and "Reselect all SmartList Selections" daily)   Diet/type: tubefeeds DVT prophylaxis LMWH Pressure ulcer(s): N/A GI prophylaxis: H2B Lines: N/A Foley:  Yes, and it is still needed Code Status:  full code Last date of multidisciplinary goals of care discussion [1/21 fiance updated at bedside]  Critical care time: 35 minutes    JD Anselm Lis [place] [facility] 04/10/2023, 9:45 AM  Please see [email or web address] for pager details.  From 7A-7P if no response, please call [phone]. After hours, please call ELink [phone].  made please call [phone] 04/10/2023, 9:24 AM

## 2023-04-11 DIAGNOSIS — J189 Pneumonia, unspecified organism: Secondary | ICD-10-CM | POA: Diagnosis not present

## 2023-04-11 DIAGNOSIS — J441 Chronic obstructive pulmonary disease with (acute) exacerbation: Secondary | ICD-10-CM | POA: Diagnosis not present

## 2023-04-11 DIAGNOSIS — J9622 Acute and chronic respiratory failure with hypercapnia: Secondary | ICD-10-CM | POA: Diagnosis not present

## 2023-04-11 DIAGNOSIS — J9621 Acute and chronic respiratory failure with hypoxia: Secondary | ICD-10-CM | POA: Diagnosis not present

## 2023-04-11 LAB — BASIC METABOLIC PANEL
Anion gap: 13 (ref 5–15)
BUN: 19 mg/dL (ref 6–20)
CO2: 31 mmol/L (ref 22–32)
Calcium: 9.1 mg/dL (ref 8.9–10.3)
Chloride: 95 mmol/L — ABNORMAL LOW (ref 98–111)
Creatinine, Ser: 0.53 mg/dL (ref 0.44–1.00)
GFR, Estimated: >60 mL/min (ref >60)
Glucose, Bld: 183 mg/dL — ABNORMAL HIGH (ref 70–99)
Potassium: 3.7 mmol/L (ref 3.5–5.1)
Sodium: 139 mmol/L (ref 135–145)

## 2023-04-11 LAB — POCT I-STAT 7, (LYTES, BLD GAS, ICA,H+H)
Acid-Base Excess: 8 mmol/L — ABNORMAL HIGH (ref 0.0–2.0)
Acid-Base Excess: 10 mmol/L — ABNORMAL HIGH (ref 0.0–2.0)
Bicarbonate: 36.1 mmol/L — ABNORMAL HIGH (ref 20.0–28.0)
Bicarbonate: 38.2 mmol/L — ABNORMAL HIGH (ref 20.0–28.0)
Calcium, Ion: 1.24 mmol/L (ref 1.15–1.40)
Calcium, Ion: 1.27 mmol/L (ref 1.15–1.40)
HCT: 43 % (ref 36.0–46.0)
HCT: 43 % (ref 36.0–46.0)
Hemoglobin: 14.6 g/dL (ref 12.0–15.0)
Hemoglobin: 14.6 g/dL (ref 12.0–15.0)
O2 Saturation: 91 %
O2 Saturation: 93 %
Patient temperature: 37.4
Patient temperature: 36.8
Potassium: 3.9 mmol/L (ref 3.5–5.1)
Potassium: 3.7 mmol/L (ref 3.5–5.1)
Sodium: 140 mmol/L (ref 135–145)
Sodium: 140 mmol/L (ref 135–145)
TCO2: 38 mmol/L — ABNORMAL HIGH (ref 22–32)
TCO2: 40 mmol/L — ABNORMAL HIGH (ref 22–32)
pCO2 arterial: 66.6 mm[Hg] (ref 32–48)
pCO2 arterial: 67.8 mm[Hg] (ref 32–48)
pH, Arterial: 7.344 — ABNORMAL LOW (ref 7.35–7.45)
pH, Arterial: 7.358 (ref 7.35–7.45)
pO2, Arterial: 69 mm[Hg] — ABNORMAL LOW (ref 83–108)
pO2, Arterial: 73 mm[Hg] — ABNORMAL LOW (ref 83–108)

## 2023-04-11 LAB — RESPIRATORY PANEL BY PCR

## 2023-04-11 LAB — CULTURE, RESPIRATORY W GRAM STAIN

## 2023-04-11 LAB — CBC
HCT: 44.5 % (ref 36.0–46.0)
Hemoglobin: 14.1 g/dL (ref 12.0–15.0)
MCH: 31.3 pg (ref 26.0–34.0)
MCHC: 31.7 g/dL (ref 30.0–36.0)
MCV: 98.7 fL (ref 80.0–100.0)
Platelets: 260 10*3/uL (ref 150–400)
RBC: 4.51 MIL/uL (ref 3.87–5.11)
RDW: 14.8 % (ref 11.5–15.5)
WBC: 16.9 10*3/uL — ABNORMAL HIGH (ref 4.0–10.5)
nRBC: 0 % (ref 0.0–0.2)

## 2023-04-11 LAB — GLUCOSE, CAPILLARY
Glucose-Capillary: 121 mg/dL — ABNORMAL HIGH (ref 70–99)
Glucose-Capillary: 135 mg/dL — ABNORMAL HIGH (ref 70–99)
Glucose-Capillary: 198 mg/dL — ABNORMAL HIGH (ref 70–99)
Glucose-Capillary: 217 mg/dL — ABNORMAL HIGH (ref 70–99)
Glucose-Capillary: 255 mg/dL — ABNORMAL HIGH (ref 70–99)
Glucose-Capillary: 94 mg/dL (ref 70–99)

## 2023-04-11 LAB — TRIGLYCERIDES: Triglycerides: 317 mg/dL — ABNORMAL HIGH (ref <150)

## 2023-04-11 MED ORDER — INSULIN ASPART 100 UNIT/ML IJ SOLN
0.0000 [IU] | INTRAMUSCULAR | Status: DC
Start: 2023-04-11 — End: 2023-04-16
  Administered 2023-04-11: 2 [IU] via SUBCUTANEOUS
  Administered 2023-04-11: 5 [IU] via SUBCUTANEOUS
  Administered 2023-04-11 – 2023-04-12 (×2): 2 [IU] via SUBCUTANEOUS
  Administered 2023-04-12 (×2): 3 [IU] via SUBCUTANEOUS
  Administered 2023-04-13: 5 [IU] via SUBCUTANEOUS
  Administered 2023-04-13 – 2023-04-14 (×2): 2 [IU] via SUBCUTANEOUS
  Administered 2023-04-14: 5 [IU] via SUBCUTANEOUS
  Administered 2023-04-14 (×2): 2 [IU] via SUBCUTANEOUS
  Administered 2023-04-15: 8 [IU] via SUBCUTANEOUS
  Administered 2023-04-16: 2 [IU] via SUBCUTANEOUS

## 2023-04-11 MED ORDER — SODIUM CHLORIDE 0.9 % IV SOLN
2.0000 g | INTRAVENOUS | Status: AC
  Administered 2023-04-11 – 2023-04-16 (×6): 2 g via INTRAVENOUS
  Filled 2023-04-11 (×6): qty 20

## 2023-04-11 MED ORDER — POTASSIUM CHLORIDE 10 MEQ/100ML IV SOLN
10.0000 meq | INTRAVENOUS | Status: AC
  Administered 2023-04-11 (×4): 10 meq via INTRAVENOUS
  Filled 2023-04-11 (×4): qty 100

## 2023-04-11 MED ORDER — ORAL CARE MOUTH RINSE: 15.0000 mL | OROMUCOSAL | Status: DC | PRN

## 2023-04-11 MED ORDER — DEXMEDETOMIDINE HCL IN NACL 400 MCG/100ML IV SOLN
0.0000 ug/kg/h | INTRAVENOUS | Status: DC
  Administered 2023-04-11: 0.04 ug/kg/h via INTRAVENOUS
  Administered 2023-04-11: 0.4 ug/kg/h via INTRAVENOUS
  Administered 2023-04-12: 0.6 ug/kg/h via INTRAVENOUS
  Filled 2023-04-11 (×3): qty 100

## 2023-04-11 MED ORDER — INSULIN GLARGINE-YFGN 100 UNIT/ML ~~LOC~~ SOLN
10.0000 [IU] | Freq: Two times a day (BID) | SUBCUTANEOUS | Status: DC
  Administered 2023-04-11 – 2023-04-12 (×4): 10 [IU] via SUBCUTANEOUS
  Filled 2023-04-11 (×6): qty 0.1

## 2023-04-11 MED ORDER — ACETAZOLAMIDE SODIUM 500 MG IJ SOLR
500.0000 mg | Freq: Two times a day (BID) | INTRAMUSCULAR | Status: AC
  Administered 2023-04-11 (×2): 500 mg via INTRAVENOUS
  Filled 2023-04-11 (×2): qty 500

## 2023-04-11 MED ORDER — ORAL CARE MOUTH RINSE
15.0000 mL | OROMUCOSAL | Status: DC
  Administered 2023-04-11 – 2023-04-16 (×17): 15 mL via OROMUCOSAL

## 2023-04-11 MED ORDER — STERILE WATER FOR INJECTION IJ SOLN
INTRAMUSCULAR | Status: AC
  Administered 2023-04-11: 10 mL
  Filled 2023-04-11: qty 10

## 2023-04-11 NOTE — Procedures (Signed)
Extubation Procedure Note  Patient Details:   Name: [name] DOB: Jan 20, 1971 MRN: [id number]   Airway Documentation:    Vent end date: 04/11/23 Vent end time: 0944   Evaluation  O2 sats: stable throughout Complications: No apparent complications Patient did tolerate procedure well. Bilateral Breath Sounds: Inspiratory wheezes, Diminished   Yes  Pt extubated per MD order, placed on BiPAP. Positive cuff leak noted, no stridor heard. Pt tolerating BiPAP well at this time. RT will continue to monitor.  Vicente Masson 04/11/2023, 9:48 AM

## 2023-04-11 NOTE — Progress Notes (Addendum)
NAME:  Michele [name], MRN:  [id number], DOB:  February 19, 1971, LOS: 5 ADMISSION DATE:  04/06/2023, CONSULTATION DATE:  04/06/2023 REFERRING MD:  Dr. Margarita Grizzle, CHIEF COMPLAINT:  respiratory failure   History of Present Illness:  [name] presented to [facility] emergency department via [facility] with complaints of respiratory distress.  She apparently reported shortness of breath x 2 days prior to presentation.  Per EMS, the patient was able to only speak in short sentences and subsequently became more short of breath after being loaded into the ambulance, down to 1 word sentences.  EMS administered Solu-Medrol 125 mg IV, magnesium 2 g IV and DuoNeb aerosols.  She arrived in the emergency department with demonstrably labored breathing and tachypnea.  In the ER, the patient was supported with BiPAP; however, she failed to improve.  ABG drawn at 1430: 7.12/111/345.  She was also found to be positive for influenza A infection.  Ultimately, decision was made by the ER staff to endotracheally intubate the patient.  PCCM service was called to admit the patient to the ICU.  Case was discussed with the ER nurse at bedside, who reported that the patient did require multiple boluses of propofol for sedation and subsequent intubation.  This resulted in hypotension, which prompted initiation of peripheral norepinephrine infusion.   Significant Hospital Events: Including procedures, antibiotic start and stop dates in addition to other pertinent events   1/17 Intubated in ER.  Found to be FLU A +.  Started on aerosols, steroids and Tamiflu. 1/18 pCO2 improved on a.m. ABG, no issues overnight.  Remains on deep sedation with continued faint bilateral wheezing on exam 1/19 Tolerated SBT this am but became agitated when having a BM, sedated again  1/20: again on SBT but then agitated, hypoxic, cyanotic. Placed back on rate and sedation.   Interim History / Subjective:  Awake  weaning prop/fent Started on SBT  Objective   Blood pressure 108/66, pulse 72, temperature 99.1 F (37.3 C), resp. rate (!) 23, height 5\' 2"  (1.575 m), weight 97.3 kg, last menstrual period 03/20/2009, SpO2 93%.    Vent Mode: PRVC FiO2 (%):  [50 %-60 %] 60 % Set Rate:  [24 bmp-32 bmp] 32 bmp Vt Set:  [450 mL] 450 mL PEEP:  [5 cmH20-8 cmH20] 8 cmH20 Plateau Pressure:  [25 cmH20-31 cmH20] 25 cmH20   Intake/Output Summary (Last 24 hours) at 04/11/2023 0710 Last data filed at 04/11/2023 0600 Gross per 24 hour  Intake 2478.11 ml  Output 1500 ml  Net 978.11 ml   Filed Weights   04/09/23 0449 04/10/23 0500 04/11/23 0500  Weight: 97.3 kg 95.5 kg 97.3 kg    Examination: General:  critically ill appearing on mech vent HEENT: MM pink/moist; ETT in place Neuro: on sedation; opens eyes to voice; follows some commands CV: s1s2, RRR , no m/r/g PULM:  dim wheezing BS bilaterally; on mech vent PRVC GI: soft, bsx4 active  Extremities: warm/dry, no edema  Skin: no rashes or lesions   Resolved Hospital Problem list   Hypertension AKI  Assessment & Plan:  Influenza A and H. flu pneumonia  Acute on chronic hypoxic hypercapnic respiratory failure 2/2 flu A pneumonia  Acute on chronic COPD exacerbation, POA Obstructive sleep apnea on CPAP at night  Resp panel + influenza A. Presenting ABG pH 7.12, pCO2 111. Unclear if she wears home O2. CPAP at night.  P: -currently on sbt; if passes consider extubation -LTVV strategy with tidal volumes of  6-8 cc/kg ideal body weight -Goal plateau pressures less than 30 and driving pressures less than 15 -Wean PEEP/FiO2 for SpO2 >92% -VAP bundle in place -Daily SAT and SBT -PAD protocol in place -wean sedation for RASS goal 0 to -1 -cont tamiflu -w/ h. Flu on resp culture switch cefepime to rocephin -pct <0.1 hold on abx -steroids q12 -cont triple therapy nebs -cont hypertonic saline nebs and cpt  Secondary erythrocytosis/polycythemia -Likely  multifactorial: secondary to COPD/pulmonary disease, possible chronic hypoxia and long-term tobacco abuse. P: -trend cbc  Prediabetes with hyperglycemia P: -ssi and cbg monitoring -increase basal -if unable to extubate consider adding TF coverage  Tobacco abuse Plan: -nicotine patch  Anxiety Plan: -resume sertraline   Best Practice (right click and "Reselect all SmartList Selections" daily)   Diet/type: tubefeeds DVT prophylaxis LMWH Pressure ulcer(s): N/A GI prophylaxis: H2B Lines: N/A Foley:  Yes, and it is still needed Code Status:  full code Last date of multidisciplinary goals of care discussion [1/21 fiance updated at bedside]  Critical care time: 35 minutes    JD Anselm Lis [place] [facility] 04/11/2023, 7:10 AM  Please see [email or web address] for pager details.  From 7A-7P if no response, please call [phone]. After hours, please call ELink [phone].  made please call [phone] 04/11/2023, 7:10 AM

## 2023-04-11 NOTE — Progress Notes (Signed)
Nutrition Follow-up  DOCUMENTATION CODES:  Obesity unspecified  INTERVENTION:  Continue tube feeding via OG tube: Vital 1.5 @ 50 mL/hr (1200 mL/day) PROSource TF20 60 mL daily Tube feeding regimen at goal rate provides 1880 kcal, 101 grams of protein, and 917 ml of H2O.   NUTRITION DIAGNOSIS:   Inadequate oral intake related to acute illness, inability to eat as evidenced by NPO status. - remains applicable  GOAL:   Patient will meet greater than or equal to 90% of their needs - progressing  MONITOR:   Vent status, Labs, Weight trends, TF tolerance  REASON FOR ASSESSMENT:   Ventilator, Consult Enteral/tube feeding initiation and management  ASSESSMENT:   53 year old female who presented to the ED on 1/17 with respiratory distress. PMH of COPD, tobacco use, OSA on BiPAP, DM. Pt admitted with acute hypercapnic respiratory failure, COPD exacerbation, influenza A pneumonia.  01/17 - admission, intubated  Patient is currently intubated on ventilator support. No family in room at the time of assessment. Discussed with RN. Pt doing well on current TF regimen. No concerns voiced. Pt has attempted to be weaned from vent several times but develops agitation.  Will leave TF regimen as ordered at this time.   MV: 11.6 L/min Temp (24hrs), Avg:99.1 F (37.3 C), Min:97.2 F (36.2 C), Max:99.7 F (37.6 C)  Propofol: 17.51 ml/hr (462kcal/d at current rate)  Admit weight: 101 kg (question accuracy)  Current weight: 97.3 kg   Intake/Output Summary (Last 24 hours) at 04/11/2023 0818 Last data filed at 04/11/2023 0800 Gross per 24 hour  Intake 2551.97 ml  Output 1550 ml  Net 1001.97 ml  Net IO Since Admission: 6,610.69 mL [04/11/23 0818]  Drains/Lines: OGT 16 Fr UOP x 24 hours  Nutritionally Relevant Medications: Scheduled Meds:  famotidine  20 mg Per Tube BID   PROSource TF20  60 mL Per Tube Daily   insulin aspart  0-20 Units Subcutaneous Q4H   insulin  glargine-yfgn  12 Units Subcutaneous QHS   methylPREDNISolone (SOLU-MEDROL) injection  40 mg Intravenous Q12H   Continuous Infusions:  ceFEPime (MAXIPIME) IV Stopped (04/11/23 0417)   feeding supplement (VITAL 1.5 CAL) 50 mL/hr at 04/11/23 0800   propofol (DIPRIVAN) infusion 30 mcg/kg/min (04/11/23 0800)   PRN Meds: docusate sodium, polyethylene glycol  Labs Reviewed: Chloride 96 BUN 21 CBG ranges from 172-255 mg/dL over the last 24 hours HgbA1c 6.2%  NUTRITION - FOCUSED PHYSICAL EXAM: Flowsheet Row Most Recent Value  Orbital Region No depletion  Upper Arm Region No depletion  Thoracic and Lumbar Region No depletion  Buccal Region No depletion  Temple Region Mild depletion  Clavicle Bone Region No depletion  Clavicle and Acromion Bone Region No depletion  Scapular Bone Region No depletion  Dorsal Hand No depletion  Patellar Region No depletion  Anterior Thigh Region No depletion  Posterior Calf Region No depletion  Edema (RD Assessment) Mild  Hair Reviewed  Eyes Reviewed  Mouth Reviewed  Skin Reviewed  Nails Reviewed   Diet Order:   Diet Order     None       EDUCATION NEEDS:  Not appropriate for education at this time  Skin:  Skin Assessment: Reviewed RN Assessment  Last BM:  1/20 - type 6  Height:  Ht Readings from Last 1 Encounters:  04/06/23 5\' 2"  (1.575 m)    Weight:  Wt Readings from Last 1 Encounters:  04/11/23 97.3 kg    Ideal Body Weight:  50 kg  BMI:  Body mass  index is 39.23 kg/m.  Estimated Nutritional Needs:  Kcal:  1800-2000 Protein:  100-115 grams Fluid:  1.8-2.0 L   Greig Castilla, RD, LDN Registered Dietitian II Please reach out via secure chat Weekend on-call pager # available in [facility]

## 2023-04-11 NOTE — Plan of Care (Signed)
  Problem: Clinical Measurements: Goal: Ability to maintain clinical measurements within normal limits will improve Outcome: Progressing Goal: Respiratory complications will improve Outcome: Progressing   Problem: Activity: Goal: Risk for activity intolerance will decrease Outcome: Progressing   Problem: Nutrition: Goal: Adequate nutrition will be maintained Outcome: Progressing   Problem: Coping: Goal: Level of anxiety will decrease Outcome: Progressing   Problem: Pain Managment: Goal: General experience of comfort will improve and/or be controlled Outcome: Progressing

## 2023-04-11 NOTE — Inpatient Diabetes Management (Signed)
Inpatient Diabetes Program Recommendations  AACE/ADA: New Consensus Statement on Inpatient Glycemic Control   Target Ranges:  Prepandial:   less than 140 mg/dL      Peak postprandial:   less than 180 mg/dL (1-2 hours)      Critically ill patients:  140 - 180 mg/dL   Review of Glycemic Control  Diabetes history: NO Outpatient Diabetes medications: NA Current orders for Inpatient glycemic control:  Semglee 10 units qhs Novolog 0-20 units Q4H Solumedrol 40 mg Q12H  Vital @ 50 ml/hr  Inpatient Diabetes Program Recommendations:    -   consider adding Novolog 4 units Q4H for tube feeding coverage.  If tube feeding is stopped or held then Novolog tube feeding coverage should also be stopped or held.  Thanks, Christena Deem RN, MSN, BC-ADM Inpatient Diabetes Coordinator Team Pager [phone] (8a-5p)

## 2023-04-11 NOTE — Progress Notes (Signed)
   04/11/23 1048  Therapy Vitals  Pulse Rate 87  Resp (!) 24  Patient Position (if appropriate) Lying  MEWS Score/Color  MEWS Score 2  MEWS Score Color Yellow  Oxygen Therapy/Pulse Ox  O2 Device (S)  Venturi Mask;HFNC  O2 Therapy Oxygen humidified  O2 Flow Rate (L/min)  (15L [place] 14L Venturi Mask)  FiO2 (%) 55 %  SpO2 (!) 88 %   Per MD ok with pt on HFNC and Venturi mask as long as she's not working due to vomiting in BiPAP mask.

## 2023-04-12 ENCOUNTER — Inpatient Hospital Stay (HOSPITAL_COMMUNITY): Payer: 59

## 2023-04-12 DIAGNOSIS — J189 Pneumonia, unspecified organism: Secondary | ICD-10-CM | POA: Diagnosis not present

## 2023-04-12 DIAGNOSIS — J9622 Acute and chronic respiratory failure with hypercapnia: Secondary | ICD-10-CM | POA: Diagnosis not present

## 2023-04-12 DIAGNOSIS — J441 Chronic obstructive pulmonary disease with (acute) exacerbation: Secondary | ICD-10-CM | POA: Diagnosis not present

## 2023-04-12 DIAGNOSIS — J9621 Acute and chronic respiratory failure with hypoxia: Secondary | ICD-10-CM | POA: Diagnosis not present

## 2023-04-12 LAB — BASIC METABOLIC PANEL
Anion gap: 9 (ref 5–15)
BUN: 19 mg/dL (ref 6–20)
CO2: 29 mmol/L (ref 22–32)
Calcium: 9.5 mg/dL (ref 8.9–10.3)
Chloride: 101 mmol/L (ref 98–111)
Creatinine, Ser: 0.43 mg/dL — ABNORMAL LOW (ref 0.44–1.00)
GFR, Estimated: >60 mL/min (ref >60)
Glucose, Bld: 185 mg/dL — ABNORMAL HIGH (ref 70–99)
Potassium: 4.5 mmol/L (ref 3.5–5.1)
Sodium: 139 mmol/L (ref 135–145)

## 2023-04-12 LAB — CBC
HCT: 48.5 % — ABNORMAL HIGH (ref 36.0–46.0)
Hemoglobin: 15.8 g/dL — ABNORMAL HIGH (ref 12.0–15.0)
MCH: 31.1 pg (ref 26.0–34.0)
MCHC: 32.6 g/dL (ref 30.0–36.0)
MCV: 95.5 fL (ref 80.0–100.0)
Platelets: 269 10*3/uL (ref 150–400)
RBC: 5.08 MIL/uL (ref 3.87–5.11)
RDW: 14.4 % (ref 11.5–15.5)
WBC: 17.8 10*3/uL — ABNORMAL HIGH (ref 4.0–10.5)
nRBC: 0 % (ref 0.0–0.2)

## 2023-04-12 LAB — GLUCOSE, CAPILLARY
Glucose-Capillary: 110 mg/dL — ABNORMAL HIGH (ref 70–99)
Glucose-Capillary: 120 mg/dL — ABNORMAL HIGH (ref 70–99)
Glucose-Capillary: 134 mg/dL — ABNORMAL HIGH (ref 70–99)
Glucose-Capillary: 170 mg/dL — ABNORMAL HIGH (ref 70–99)
Glucose-Capillary: 184 mg/dL — ABNORMAL HIGH (ref 70–99)
Glucose-Capillary: 96 mg/dL (ref 70–99)

## 2023-04-12 MED ORDER — IPRATROPIUM-ALBUTEROL 0.5-2.5 (3) MG/3ML IN SOLN
3.0000 mL | RESPIRATORY_TRACT | Status: DC | PRN
  Administered 2023-04-12 – 2023-04-15 (×4): 3 mL via RESPIRATORY_TRACT
  Filled 2023-04-12 (×4): qty 3

## 2023-04-12 MED ORDER — GUAIFENESIN 100 MG/5ML PO LIQD
10.0000 mL | Freq: Four times a day (QID) | ORAL | Status: DC
  Administered 2023-04-12 – 2023-04-15 (×13): 10 mL via ORAL
  Filled 2023-04-12 (×13): qty 10

## 2023-04-12 MED ORDER — PREDNISONE 20 MG PO TABS
40.0000 mg | ORAL_TABLET | Freq: Every day | ORAL | Status: AC
  Administered 2023-04-12 – 2023-04-13 (×2): 40 mg via ORAL
  Filled 2023-04-12 (×2): qty 2

## 2023-04-12 MED ORDER — FUROSEMIDE 10 MG/ML IJ SOLN
60.0000 mg | Freq: Once | INTRAMUSCULAR | Status: AC
  Administered 2023-04-12: 60 mg via INTRAVENOUS
  Filled 2023-04-12: qty 6

## 2023-04-12 MED ORDER — PREDNISONE 20 MG PO TABS
20.0000 mg | ORAL_TABLET | Freq: Every day | ORAL | Status: DC
  Administered 2023-04-16: 20 mg via ORAL
  Filled 2023-04-12: qty 1

## 2023-04-12 MED ORDER — HYDRALAZINE HCL 20 MG/ML IJ SOLN
10.0000 mg | INTRAMUSCULAR | Status: DC | PRN
  Administered 2023-04-12: 10 mg via INTRAVENOUS
  Filled 2023-04-12: qty 1

## 2023-04-12 MED ORDER — ADULT MULTIVITAMIN W/MINERALS CH
1.0000 | ORAL_TABLET | Freq: Every day | ORAL | Status: DC
  Administered 2023-04-12: 1
  Filled 2023-04-12: qty 1

## 2023-04-12 MED ORDER — ADULT MULTIVITAMIN W/MINERALS CH
1.0000 | ORAL_TABLET | Freq: Every day | ORAL | Status: DC
  Administered 2023-04-13 – 2023-04-19 (×7): 1 via ORAL
  Filled 2023-04-12 (×7): qty 1

## 2023-04-12 MED ORDER — ARFORMOTEROL TARTRATE 15 MCG/2ML IN NEBU
15.0000 ug | INHALATION_SOLUTION | Freq: Two times a day (BID) | RESPIRATORY_TRACT | Status: DC
  Administered 2023-04-12 – 2023-04-19 (×15): 15 ug via RESPIRATORY_TRACT
  Filled 2023-04-12 (×16): qty 2

## 2023-04-12 MED ORDER — SERTRALINE HCL 50 MG PO TABS
25.0000 mg | ORAL_TABLET | Freq: Every day | ORAL | Status: DC
  Administered 2023-04-12 – 2023-04-19 (×8): 25 mg via ORAL
  Filled 2023-04-12 (×8): qty 1

## 2023-04-12 MED ORDER — PREDNISONE 10 MG PO TABS: 10.0000 mg | ORAL_TABLET | Freq: Every day | ORAL | Status: DC

## 2023-04-12 MED ORDER — BUDESONIDE 0.5 MG/2ML IN SUSP
0.5000 mg | Freq: Two times a day (BID) | RESPIRATORY_TRACT | Status: DC
Start: 2023-04-12 — End: 2023-04-19
  Administered 2023-04-12 – 2023-04-19 (×14): 0.5 mg via RESPIRATORY_TRACT
  Filled 2023-04-12 (×16): qty 2

## 2023-04-12 MED ORDER — ENSURE ENLIVE PO LIQD
237.0000 mL | Freq: Two times a day (BID) | ORAL | Status: DC
  Administered 2023-04-12 – 2023-04-18 (×13): 237 mL via ORAL

## 2023-04-12 MED ORDER — PREDNISONE 20 MG PO TABS
30.0000 mg | ORAL_TABLET | Freq: Every day | ORAL | Status: AC
  Administered 2023-04-14 – 2023-04-15 (×2): 30 mg via ORAL
  Filled 2023-04-12 (×2): qty 1

## 2023-04-12 MED ORDER — REVEFENACIN 175 MCG/3ML IN SOLN
175.0000 ug | Freq: Every day | RESPIRATORY_TRACT | Status: DC
  Administered 2023-04-12 – 2023-04-19 (×8): 175 ug via RESPIRATORY_TRACT
  Filled 2023-04-12 (×8): qty 3

## 2023-04-12 NOTE — Plan of Care (Signed)
  Problem: Education: Goal: Knowledge of General Education information will improve Description: Including pain rating scale, medication(s)/side effects and non-pharmacologic comfort measures Outcome: Progressing   Problem: Health Behavior/Discharge Planning: Goal: Ability to manage health-related needs will improve Outcome: Progressing   Problem: Clinical Measurements: Goal: Ability to maintain clinical measurements within normal limits will improve Outcome: Progressing Goal: Will remain free from infection Outcome: Progressing Goal: Diagnostic test results will improve Outcome: Progressing Goal: Respiratory complications will improve Outcome: Progressing Goal: Cardiovascular complication will be avoided Outcome: Progressing   Problem: Activity: Goal: Risk for activity intolerance will decrease Outcome: Progressing   Problem: Nutrition: Goal: Adequate nutrition will be maintained Outcome: Progressing   Problem: Coping: Goal: Level of anxiety will decrease Outcome: Progressing   Problem: Elimination: Goal: Will not experience complications related to bowel motility Outcome: Progressing Goal: Will not experience complications related to urinary retention Outcome: Progressing   Problem: Pain Managment: Goal: General experience of comfort will improve and/or be controlled Outcome: Progressing   Problem: Safety: Goal: Ability to remain free from injury will improve Outcome: Progressing   Problem: Skin Integrity: Goal: Risk for impaired skin integrity will decrease Outcome: Progressing   Problem: Activity: Goal: Ability to tolerate increased activity will improve Outcome: Progressing   Problem: Respiratory: Goal: Ability to maintain a clear airway and adequate ventilation will improve Outcome: Progressing   Problem: Role Relationship: Goal: Method of communication will improve Outcome: Progressing   Problem: Education: Goal: Ability to describe self-care  measures that may prevent or decrease complications (Diabetes Survival Skills Education) will improve Outcome: Progressing Goal: Individualized Educational Video(s) Outcome: Progressing   Problem: Coping: Goal: Ability to adjust to condition or change in health will improve Outcome: Progressing   Problem: Fluid Volume: Goal: Ability to maintain a balanced intake and output will improve Outcome: Progressing   Problem: Health Behavior/Discharge Planning: Goal: Ability to identify and utilize available resources and services will improve Outcome: Progressing Goal: Ability to manage health-related needs will improve Outcome: Progressing   Problem: Metabolic: Goal: Ability to maintain appropriate glucose levels will improve Outcome: Progressing   Problem: Nutritional: Goal: Maintenance of adequate nutrition will improve Outcome: Progressing Goal: Progress toward achieving an optimal weight will improve Outcome: Progressing   Problem: Skin Integrity: Goal: Risk for impaired skin integrity will decrease Outcome: Progressing   Problem: Tissue Perfusion: Goal: Adequacy of tissue perfusion will improve Outcome: Progressing   Problem: Education: Goal: Ability to describe self-care measures that may prevent or decrease complications (Diabetes Survival Skills Education) will improve Outcome: Progressing Goal: Individualized Educational Video(s) Outcome: Progressing   Problem: Coping: Goal: Ability to adjust to condition or change in health will improve Outcome: Progressing   Problem: Fluid Volume: Goal: Ability to maintain a balanced intake and output will improve Outcome: Progressing   Problem: Health Behavior/Discharge Planning: Goal: Ability to identify and utilize available resources and services will improve Outcome: Progressing Goal: Ability to manage health-related needs will improve Outcome: Progressing   Problem: Metabolic: Goal: Ability to maintain appropriate  glucose levels will improve Outcome: Progressing   Problem: Nutritional: Goal: Maintenance of adequate nutrition will improve Outcome: Progressing Goal: Progress toward achieving an optimal weight will improve Outcome: Progressing   Problem: Skin Integrity: Goal: Risk for impaired skin integrity will decrease Outcome: Progressing   Problem: Tissue Perfusion: Goal: Adequacy of tissue perfusion will improve Outcome: Progressing

## 2023-04-12 NOTE — Progress Notes (Signed)
NAME:  Michele [name], MRN:  [id number], DOB:  01/25/1971, LOS: 6 ADMISSION DATE:  04/06/2023, CONSULTATION DATE:  04/06/2023 REFERRING MD:  Dr. Margarita Grizzle, CHIEF COMPLAINT:  respiratory failure   History of Present Illness:  [name] presented to [facility] emergency department via [facility] with complaints of respiratory distress.  She apparently reported shortness of breath x 2 days prior to presentation.  Per EMS, the patient was able to only speak in short sentences and subsequently became more short of breath after being loaded into the ambulance, down to 1 word sentences.  EMS administered Solu-Medrol 125 mg IV, magnesium 2 g IV and DuoNeb aerosols.  She arrived in the emergency department with demonstrably labored breathing and tachypnea.  In the ER, the patient was supported with BiPAP; however, she failed to improve.  ABG drawn at 1430: 7.12/111/345.  She was also found to be positive for influenza A infection.  Ultimately, decision was made by the ER staff to endotracheally intubate the patient.  PCCM service was called to admit the patient to the ICU.  Case was discussed with the ER nurse at bedside, who reported that the patient did require multiple boluses of propofol for sedation and subsequent intubation.  This resulted in hypotension, which prompted initiation of peripheral norepinephrine infusion.   Significant Hospital Events: Including procedures, antibiotic start and stop dates in addition to other pertinent events   1/17 Intubated in ER.  Found to be FLU A +.  Started on aerosols, steroids and Tamiflu. 1/18 pCO2 improved on a.m. ABG, no issues overnight.  Remains on deep sedation with continued faint bilateral wheezing on exam 1/19 Tolerated SBT this am but became agitated when having a BM, sedated again  1/20: again on SBT but then agitated, hypoxic, cyanotic. Placed back on rate and sedation.  1/22 extubated; placed on bipap after and  had episode on vomiting; now on optiflow and precedex for anxiety  Interim History / Subjective:  Extubated yesterday; placed on bipap after and had episode on vomiting; now on HHFNC and precedex for anxiety  This am on HHFNC 50% and 40 l/m sats mid 90s Wob improved On 0.4 precedex   Objective   Blood pressure (!) 141/80, pulse 65, temperature 98.1 F (36.7 C), temperature source Oral, resp. rate (!) 24, height 5\' 2"  (1.575 m), weight 97.3 kg, last menstrual period 03/20/2009, SpO2 96%.    Vent Mode: PCV;BIPAP FiO2 (%):  [50 %-100 %] 60 % Set Rate:  [15 bmp] 15 bmp PEEP:  [8 cmH20] 8 cmH20 Pressure Support:  [10 cmH20] 10 cmH20   Intake/Output Summary (Last 24 hours) at 04/12/2023 4098 Last data filed at 04/12/2023 0700 Gross per 24 hour  Intake 1298.63 ml  Output 2475 ml  Net -1176.37 ml   Filed Weights   04/10/23 0500 04/11/23 0500 04/12/23 0254  Weight: 95.5 kg 97.3 kg 97.3 kg    Examination: General:  ill appearing female in nad HEENT: MM pink/moist; HHFNC in place Neuro: Aox3; mae CV: s1s2, RRR , no m/r/g PULM:  dim crackles BS bilaterally; on HHFNC 50% 40l/m GI: soft, bsx4 active  Extremities: warm/dry, no edema  Skin: no rashes or lesions   Resolved Hospital Problem list   Hypertension AKI  Assessment & Plan:  Influenza A and H. flu pneumonia  Acute on chronic hypoxic hypercapnic respiratory failure 2/2 flu A pneumonia  Acute on chronic COPD exacerbation, POA Obstructive sleep apnea on CPAP at night  Resp  panel + influenza A. Presenting ABG pH 7.12, pCO2 111. Unclear if she wears home O2. CPAP at night.  P: -wean HHFNC for sats >92%; consider switching to [place] later today -pulm toiletry -triple therapy nebs -hypertonic saline nebs and cpt; flutter/IS; may need to consider starting metaneb if not improving -precedex added yesterday for anxiety/wob; will turn off today -cont rocephin; follow culture sensitivities -cont steroids -cont  robitussin  Secondary erythrocytosis/polycythemia -Likely multifactorial: secondary to COPD/pulmonary disease, possible chronic hypoxia and long-term tobacco abuse. P: -trend cbc  Prediabetes with hyperglycemia P: -decreased ssi while not eating; cont cbg monitoring -cont basal -will get SLP eval and try to advance diet if able  Tobacco abuse Plan: -nicotine patch -cessation counseling  Anxiety Plan: -resume sertraline   Best Practice (right click and "Reselect all SmartList Selections" daily)   Diet/type: NPO DVT prophylaxis LMWH Pressure ulcer(s): N/A GI prophylaxis: N/A Lines: N/A Foley:  removal ordered  Code Status:  full code Last date of multidisciplinary goals of care discussion [1/22 fiance and son updated at bedside]  Critical care time: 35 minutes    JD Anselm Lis [facility] 04/12/2023, 7:12 AM  Please see [email or web address] for pager details.  From 7A-7P if no response, please call [phone]. After hours, please call ELink [phone].  made please call [phone] 04/12/2023, 7:12 AM

## 2023-04-12 NOTE — Evaluation (Signed)
Occupational Therapy Evaluation Patient Details Name: Michele [name] MRN: [id number] DOB: 17-Nov-1970 Today's Date: 04/12/2023   History of Present Illness Pt is a 53 year old woman admitted on 1/17 with acute on chronic respiratory failure in the setting influenza A PNA. Intubated 1/17-1/22 with agitation complicating hospital course. PMH: COPD, tobacco use, OSA on bipap, DM.   Clinical Impression   Pt was independent prior to admission. Lives with her significant other who can provide 24 hour care when she returns home. Pt presents with generalized pain, weakness, impaired cognition, decreased standing balance and poor activity tolerance. Pt is currently on HHFNC. Pt is able to transfer to and from [facility] with steadying (min) assist. She, overall, requires set up to min assist for ADLs with increased time and intermittent rest breaks. Recommending HHOT, pt may progress and not require post acute OT.       If plan is discharge home, recommend the following: A little help with walking and/or transfers;A little help with bathing/dressing/bathroom;Assistance with cooking/housework;Assistance with feeding;Direct supervision/assist for medications management;Direct supervision/assist for financial management;Assist for transportation;Help with stairs or ramp for entrance    Functional Status Assessment  Patient has had a recent decline in their functional status and demonstrates the ability to make significant improvements in function in a reasonable and predictable amount of time.  Equipment Recommendations  Tub/shower seat    Recommendations for Other Services       Precautions / Restrictions Precautions Precautions: Fall Precaution Comments: HHFNC Restrictions Weight Bearing Restrictions Per Provider Order: No      Mobility Bed Mobility               General bed mobility comments: in chair    Transfers Overall transfer level: Needs assistance Equipment used:  None Transfers: Sit to/from Stand, Bed to chair/wheelchair/BSC Sit to Stand: Min assist     Step pivot transfers: Min assist     General transfer comment: steadying assist and for lines      Balance Overall balance assessment: Needs assistance   Sitting balance-Leahy Scale: Good       Standing balance-Leahy Scale: Poor Standing balance comment: reaches for arms of BSC or therapist's hand to steady                           ADL either performed or assessed with clinical judgement   ADL Overall ADL's : Needs assistance/impaired Eating/Feeding: Set up;Sitting   Grooming: Wash/dry hands;Sitting;Set up   Upper Body Bathing: Minimal assistance;Sitting   Lower Body Bathing: Minimal assistance;Sit to/from stand   Upper Body Dressing : Set up;Sitting   Lower Body Dressing: Minimal assistance;Sit to/from stand   Toilet Transfer: Minimal assistance;Stand-pivot;BSC/3in1   Toileting- Clothing Manipulation and Hygiene: Minimal assistance;Sit to/from stand               Vision Ability to See in Adequate Light: 0 Adequate Patient Visual Report: No change from baseline       Perception         Praxis         Pertinent Vitals/Pain Pain Assessment Pain Assessment: Faces Faces Pain Scale: Hurts little more Pain Location: generalized Pain Descriptors / Indicators: Moaning, Sore, Discomfort Pain Intervention(s): Repositioned     Extremity/Trunk Assessment Upper Extremity Assessment Upper Extremity Assessment: Overall WFL for tasks assessed   Lower Extremity Assessment Lower Extremity Assessment: Defer to PT evaluation   Cervical / Trunk Assessment Cervical / Trunk Assessment: Normal  Communication Communication Communication: No apparent difficulties   Cognition Arousal: Alert Behavior During Therapy: Flat affect, Anxious, Restless Overall Cognitive Status: Impaired/Different from baseline Area of Impairment: Safety/judgement, Problem  solving                         Safety/Judgement: Decreased awareness of safety   Problem Solving: Slow processing, Requires verbal cues General Comments: decreased awareness of lines     General Comments       Exercises     Shoulder Instructions      Home Living Family/patient expects to be discharged to:: Private residence Living Arrangements: Spouse/significant other Available Help at Discharge: Friend(s);Available 24 hours/day Type of Home: House Home Access: Stairs to enter [employer] of Steps: 5 Entrance Stairs-Rails: None Home Layout: One level     Bathroom Shower/Tub: Chief Strategy Officer: Standard     Home Equipment: Cane - single point          Prior Functioning/Environment Prior Level of Function : Independent/Modified Independent;Driving;Working/employed               ADLs Comments: works at [employer] List: Decreased strength;Decreased activity tolerance;Impaired balance (sitting and/or standing);Decreased cognition;Decreased safety awareness;Cardiopulmonary status limiting activity;Pain      OT Treatment/Interventions: Self-care/ADL training;Energy conservation;DME and/or AE instruction;Patient/family education;Balance training;Therapeutic activities;Cognitive remediation/compensation    OT Goals(Current goals can be found in the care plan section) Acute Rehab OT Goals OT Goal Formulation: With patient Time For Goal Achievement: 04/26/23 Potential to Achieve Goals: Good ADL Goals Pt Will Perform Grooming: with modified independence;standing Pt Will Perform Lower Body Bathing: with modified independence;sit to/from stand Pt Will Perform Lower Body Dressing: with modified independence;sit to/from stand Pt Will Transfer to Toilet: with modified independence;ambulating Pt Will Perform Toileting - Clothing Manipulation and hygiene: with modified independence;sit to/from stand Pt Will Perform  Tub/Shower Transfer: Tub transfer;with supervision;ambulating;shower seat Additional ADL Goal #1: Pt will tolerate 15 minutes of ADL training with 3 rest breaks.  OT Frequency: Min 1X/week    Co-evaluation              AM-PAC OT "6 Clicks" Daily Activity     Outcome Measure Help from another person eating meals?: None Help from another person taking care of personal grooming?: A Little Help from another person toileting, which includes using toliet, bedpan, or urinal?: A Little Help from another person bathing (including washing, rinsing, drying)?: A Little Help from another person to put on and taking off regular upper body clothing?: A Little Help from another person to put on and taking off regular lower body clothing?: A Little 6 Click Score: 19   End of Session Equipment Utilized During Treatment: Oxygen  Activity Tolerance: Patient tolerated treatment well Patient left: in chair;with call bell/phone within reach;with family/visitor present  OT Visit Diagnosis: Unsteadiness on feet (R26.81);Muscle weakness (generalized) (M62.81);Other (comment);Other symptoms and signs involving cognitive function                Time: 1610-9604 OT Time Calculation (min): 13 min Charges:  OT General Charges $OT Visit: 1 Visit OT Evaluation $OT Eval Moderate Complexity: 1 Mod  Berna Spare, OTR/L Acute Rehabilitation Services Office: [phone]   Evern Bio 04/12/2023, 11:34 AM

## 2023-04-12 NOTE — Progress Notes (Signed)
PT Cancellation Note  Patient Details Name: Michele [name] MRN: [id number] DOB: Feb 21, 1971   Cancelled Treatment:    Reason Eval/Treat Not Completed: Other (comment)  Patient has just finished OT evaluation, OOB in chair, having some increased WOB.  Will give her a while to rest and check back this afternoon for PT evaluation.  Kathlyn Sacramento, PT, DPT [facility]  Rehabilitation Services Physical Therapist Office: [phone] Website: [email or web address]   Berton Mount 04/12/2023, 10:55 AM

## 2023-04-12 NOTE — Evaluation (Cosign Needed)
Clinical/Bedside Swallow Evaluation Patient Details  Name: Michele [name] MRN: [id number] Date of Birth: [birth year]  Today's Date: 04/12/2023 Time: SLP Start Time (ACUTE ONLY): 1015 SLP Stop Time (ACUTE ONLY): 1030 SLP Time Calculation (min) (ACUTE ONLY): 15 min  Past Medical History:  Past Medical History:  Diagnosis Date   COPD (chronic obstructive pulmonary disease) (HCC)    Medical history non-contributory    Past Surgical History:  Past Surgical History:  Procedure Laterality Date   BILATERAL SALPINGECTOMY  04/07/2015   Procedure: BILATERAL SALPINGECTOMY with left oophorectomy, left ovarian cystectomy and removal of pelvic mass;  Surgeon: Kathreen Cosier, MD;  Location: [facility];  Service: Gynecology;;   CESAREAN SECTION     WISDOM TOOTH EXTRACTION     HPI:  Pt is 53 y.o female who presented to ED on 01/17 with c/o respiratory distress. PPer EMS, the patient was able to only speak in short sentences and subsequently became more short of breath after being loaded into the ambulance, down to 1 word sentences. Intubated on 01/17 and extubated on 01/22. Tested positive for Influenza A on 01/17. Placed on BIPAP followed by vomiting episode and subsequently put on Optiflow on 01/22. PMHx includes OSA and COPD.    Assessment / Plan / Recommendation  Clinical Impression  Pt does not present with signs of aspiration or dysphagia. Pt noted that her voice is currently more hoarse than prior to hospital visit. Along with typical PO trials, pt tolerated Yale 3oz water test. SLP will f/u with pt at least once to observe pt eating a full meal to confirm swallow safety post extubation.      Aspiration Risk  No limitations    Diet Recommendation Regular;Thin liquid    Liquid Administration via: Cup;Straw Medication Administration: Whole meds with liquid Supervision: Patient able to self feed    Other  Recommendations Oral Care Recommendations: Oral care BID    Recommendations for  follow up therapy are one component of a multi-disciplinary discharge planning process, led by the attending physician.  Recommendations may be updated based on patient status, additional functional criteria and insurance authorization.  Follow up Recommendations Follow physician's recommendations for discharge plan and follow up therapies      Assistance Recommended at Discharge    Functional Status Assessment Patient has had a recent decline in their functional status and demonstrates the ability to make significant improvements in function in a reasonable and predictable amount of time.  Frequency and Duration min 1 x/week  2 weeks       Prognosis        Swallow Study   General Date of Onset: 04/06/23 HPI: Pt is 53 y.o female who presented to ED on 01/17 with c/o respiratory distress. PPer EMS, the patient was able to only speak in short sentences and subsequently became more short of breath after being loaded into the ambulance, down to 1 word sentences. Intubated on 01/17 and extubated on 01/22. Tested positive for Influenza A on 01/17. Placed on BIPAP followed by vomiting episode and subsequently put on Optiflow on 01/22. PMHx includes OSA and COPD. Type of Study: Bedside Swallow Evaluation Diet Prior to this Study: Information not available Respiratory Status: Nasal cannula;Venti-mask Oral Care Completed by SLP: No Oral Cavity - Dentition: Adequate natural dentition Vision: Functional for self-feeding Self-Feeding Abilities: Able to feed self Patient Positioning: Upright in chair Baseline Vocal Quality: Hoarse Volitional Cough: Congested;Strong Volitional Swallow: Able to elicit    Oral/Motor/Sensory Function Overall Oral Motor/Sensory Function:  Within functional limits   Ice Chips Ice chips: Within functional limits Presentation: Spoon   Thin Liquid Thin Liquid: Within functional limits Presentation: Cup;Self Fed;Straw    Nectar Thick Nectar Thick Liquid: Not tested    Honey Thick Honey Thick Liquid: Not tested   Puree Puree: Within functional limits Presentation: Spoon   Solid     Solid: Within functional limits Presentation: Self Fed      Rowe Robert 04/12/2023,11:19 AM

## 2023-04-12 NOTE — Progress Notes (Signed)
Nutrition Follow-up  DOCUMENTATION CODES:  Obesity unspecified  INTERVENTION:  Continue regular diet Encourage PO intake Ensure Enlive po BID, each supplement provides 350 kcal and 20 grams of protein. MVI with minerals daily  NUTRITION DIAGNOSIS:  Inadequate oral intake related to acute illness, inability to eat as evidenced by NPO status. - remains applicable  GOAL:  Patient will meet greater than or equal to 90% of their needs - progressing  MONITOR:  Diet advancement, Labs, Weight trends  REASON FOR ASSESSMENT:  Ventilator, Consult Enteral/tube feeding initiation and management  ASSESSMENT:  53 year old female who presented to the ED on 1/17 with respiratory distress. PMH of COPD, tobacco use, OSA on BiPAP, DM. Pt admitted with acute hypercapnic respiratory failure, COPD exacerbation, influenza A pneumonia.  1/17 - admission, intubated 1/22 - extubated, placed on bipap  Pt resting in bedside chair at the time of assessment. On HFNC at this time. Discussed with RN, able to pass a bedside swallow this AM and diet has just been entered. Will add nutrition supplements to augment intake.  Admit weight: 101 kg (question accuracy)  Current weight: 97.3 kg   Intake/Output Summary (Last 24 hours) at 04/12/2023 1349 Last data filed at 04/12/2023 1000 Gross per 24 hour  Intake 742.73 ml  Output 2775 ml  Net -2032.27 ml  Net IO Since Admission: 4,761.81 mL [04/12/23 1349]  Drains/Lines: UOP x 24 hours  Nutritionally Relevant Medications: Scheduled Meds:  insulin aspart  0-15 Units Subcutaneous Q4H   insulin glargine-yfgn  10 Units Subcutaneous BID   predniSONE  40 mg Oral Q breakfast   Continuous Infusions:  cefTRIAXone (ROCEPHIN)  IV Stopped (04/11/23 1139)   PRN Meds:.docusate sodium, polyethylene glycol  Labs Reviewed: Chloride 96 BUN 21 CBG ranges from 172-255 mg/dL over the last 24 hours HgbA1c 6.2%  NUTRITION - FOCUSED PHYSICAL EXAM: Flowsheet Row  Most Recent Value  Orbital Region No depletion  Upper Arm Region No depletion  Thoracic and Lumbar Region No depletion  Buccal Region No depletion  Temple Region Mild depletion  Clavicle Bone Region No depletion  Clavicle and Acromion Bone Region No depletion  Scapular Bone Region No depletion  Dorsal Hand No depletion  Patellar Region No depletion  Anterior Thigh Region No depletion  Posterior Calf Region No depletion  Edema (RD Assessment) Mild  Hair Reviewed  Eyes Reviewed  Mouth Reviewed  Skin Reviewed  Nails Reviewed   Diet Order:   Diet Order             Diet regular Fluid consistency: Thin  Diet effective now                   EDUCATION NEEDS:  Not appropriate for education at this time  Skin:  Skin Assessment: Reviewed RN Assessment  Last BM:  1/20 - type 6  Height:  Ht Readings from Last 1 Encounters:  04/06/23 5\' 2"  (1.575 m)    Weight:  Wt Readings from Last 1 Encounters:  04/12/23 97.3 kg    Ideal Body Weight:  50 kg  BMI:  Body mass index is 39.23 kg/m.  Estimated Nutritional Needs:  Kcal:  1800-2000 Protein:  100-115 grams Fluid:  1.8-2.0 L   Greig Castilla, RD, LDN Registered Dietitian II Please reach out via secure chat Weekend on-call pager # available in [facility]

## 2023-04-12 NOTE — Evaluation (Signed)
Physical Therapy Evaluation Patient Details Name: Michele [name] MRN: [id number] DOB: December 17, 1970 Today's Date: 04/12/2023  History of Present Illness  Pt is a 53 year old woman admitted on 1/17 with acute on chronic respiratory failure in the setting influenza A PNA. Intubated 1/17-1/22 with agitation complicating hospital course. PMH: COPD, tobacco use, OSA on bipap, DM.  Clinical Impression  Pt admitted with above diagnosis. Previously independent, working for [employer]. Required CGA for transfer and short distance gait in room today. On HHFNC at 50% FiO2 with sats ranging between 88-90% throughout session. Easily fatigued but took several steps in room before she became too fatigued to continue. Will continue to follow and progress as tolerated. Pt currently with functional limitations due to the deficits listed below (see PT Problem List). Pt will benefit from acute skilled PT to increase their independence and safety with mobility to allow discharge.           If plan is discharge home, recommend the following: A little help with walking and/or transfers;A little help with bathing/dressing/bathroom;Assistance with cooking/housework;Direct supervision/assist for medications management;Direct supervision/assist for financial management;Assist for transportation;Help with stairs or ramp for entrance;Supervision due to cognitive status   Can travel by private vehicle        Equipment Recommendations Rolling walker (2 wheels) (May progress to no needs.)  Recommendations for Other Services       Functional Status Assessment Patient has had a recent decline in their functional status and demonstrates the ability to make significant improvements in function in a reasonable and predictable amount of time.     Precautions / Restrictions Precautions Precautions: Fall Precaution Comments: HHFNC Restrictions Weight Bearing Restrictions Per Provider Order: No       Mobility  Bed Mobility               General bed mobility comments: in chair    Transfers Overall transfer level: Needs assistance Equipment used: Rolling walker (2 wheels) Transfers: Sit to/from Stand Sit to Stand: Contact guard assist           General transfer comment: VC for hand placement to push up from recliner with hands on arm rests for leverage. CGA for safety, RW for support upon standing. Performed x2 with good recall on second trial.    Ambulation/Gait Ambulation/Gait assistance: Contact guard assist Gait Distance (Feet): 4 Feet Assistive device: Rolling walker (2 wheels) Gait Pattern/deviations: Step-through pattern, Decreased stride length, Drifts right/left Gait velocity: dec     General Gait Details: Mildly unsteady, using RW for support with close CGA for safety. Cues for walker placement and sequencing. Took several steps forward and backwards back to chair. Too fatigued to try a second time after seated rest break. SpO2 88% on HHFNC 50% FiO2.  Stairs            Wheelchair Mobility     Tilt Bed    Modified Rankin (Stroke Patients Only)       Balance Overall balance assessment: Needs assistance Sitting-balance support: No upper extremity supported, Feet supported Sitting balance-Leahy Scale: Fair       Standing balance-Leahy Scale: Poor Standing balance comment: RW for support. feels more secure                             Pertinent Vitals/Pain Pain Assessment Pain Assessment: Faces Faces Pain Scale: Hurts little more Pain Location: generalized Pain Descriptors / Indicators: Moaning, Sore, Discomfort Pain Intervention(s):  Monitored during session, Repositioned    Home Living Family/patient expects to be discharged to:: Private residence Living Arrangements: Spouse/significant other Available Help at Discharge: Friend(s);Available 24 hours/day Type of Home: House Home Access: Stairs to enter Entrance  Stairs-Rails: None Entrance Stairs-Number of Steps: 5   Home Layout: One level Home Equipment: Cane - single point      Prior Function Prior Level of Function : Independent/Modified Independent;Driving;Working/employed             Mobility Comments: ind ADLs Comments: works at [employer]   Upper Extremity Assessment Upper Extremity Assessment: Defer to OT evaluation    Lower Extremity Assessment Lower Extremity Assessment: Generalized weakness    Cervical / Trunk Assessment Cervical / Trunk Assessment: Normal  Communication   Communication Communication: No apparent difficulties Cueing Techniques: Verbal cues;Gestural cues  Cognition Arousal: Alert Behavior During Therapy: Flat affect, Anxious, Restless Overall Cognitive Status: Impaired/Different from baseline Area of Impairment: Safety/judgement, Problem solving                         Safety/Judgement: Decreased awareness of safety   Problem Solving: Slow processing, Requires verbal cues General Comments: decreased awareness, delayed processing.        General Comments      Exercises General Exercises - Lower Extremity Ankle Circles/Pumps: AROM, Both, 10 reps, Seated   Assessment/Plan    PT Assessment Patient needs continued PT services  PT Problem List Decreased strength;Decreased activity tolerance;Decreased balance;Decreased mobility;Decreased cognition;Decreased knowledge of use of DME;Decreased safety awareness;Decreased knowledge of precautions;Cardiopulmonary status limiting activity;Obesity       PT Treatment Interventions DME instruction;Gait training;Stair training;Functional mobility training;Therapeutic activities;Therapeutic exercise;Balance training;Neuromuscular re-education;Cognitive remediation;Patient/family education    PT Goals (Current goals can be found in the Care Plan section)  Acute Rehab PT Goals Patient Stated Goal: get well PT Goal  Formulation: With patient Time For Goal Achievement: 04/26/23 Potential to Achieve Goals: Good    Frequency Min 1X/week     Co-evaluation               AM-PAC PT "6 Clicks" Mobility  Outcome Measure Help needed turning from your back to your side while in a flat bed without using bedrails?: A Little Help needed moving from lying on your back to sitting on the side of a flat bed without using bedrails?: A Little Help needed moving to and from a bed to a chair (including a wheelchair)?: A Little Help needed standing up from a chair using your arms (e.g., wheelchair or bedside chair)?: A Little Help needed to walk in hospital room?: A Lot Help needed climbing 3-5 steps with a railing? : Total 6 Click Score: 15    End of Session Equipment Utilized During Treatment: Gait belt;Oxygen Activity Tolerance: Patient limited by fatigue Patient left: in chair;with call bell/phone within reach Nurse Communication: Mobility status PT Visit Diagnosis: Unsteadiness on feet (R26.81);Other abnormalities of gait and mobility (R26.89);Muscle weakness (generalized) (M62.81)    Time: 7628-3151 PT Time Calculation (min) (ACUTE ONLY): 12 min   Charges:   PT Evaluation $PT Eval Low Complexity: 1 Low   PT General Charges $$ ACUTE PT VISIT: 1 Visit         Kathlyn Sacramento, PT, DPT [facility]  Rehabilitation Services Physical Therapist Office: [phone] Website: [email or web address]   Berton Mount 04/12/2023, 3:11 PM

## 2023-04-13 DIAGNOSIS — J441 Chronic obstructive pulmonary disease with (acute) exacerbation: Secondary | ICD-10-CM | POA: Diagnosis not present

## 2023-04-13 LAB — GLUCOSE, CAPILLARY
Glucose-Capillary: 107 mg/dL — ABNORMAL HIGH (ref 70–99)
Glucose-Capillary: 111 mg/dL — ABNORMAL HIGH (ref 70–99)
Glucose-Capillary: 145 mg/dL — ABNORMAL HIGH (ref 70–99)
Glucose-Capillary: 201 mg/dL — ABNORMAL HIGH (ref 70–99)
Glucose-Capillary: 96 mg/dL (ref 70–99)
Glucose-Capillary: 98 mg/dL (ref 70–99)

## 2023-04-13 LAB — BASIC METABOLIC PANEL
Anion gap: 11 (ref 5–15)
BUN: 21 mg/dL — ABNORMAL HIGH (ref 6–20)
CO2: 32 mmol/L (ref 22–32)
Calcium: 9.4 mg/dL (ref 8.9–10.3)
Chloride: 95 mmol/L — ABNORMAL LOW (ref 98–111)
Creatinine, Ser: 0.52 mg/dL (ref 0.44–1.00)
GFR, Estimated: >60 mL/min (ref >60)
Glucose, Bld: 107 mg/dL — ABNORMAL HIGH (ref 70–99)
Potassium: 3.1 mmol/L — ABNORMAL LOW (ref 3.5–5.1)
Sodium: 138 mmol/L (ref 135–145)

## 2023-04-13 LAB — CBC
HCT: 49.1 % — ABNORMAL HIGH (ref 36.0–46.0)
Hemoglobin: 16.3 g/dL — ABNORMAL HIGH (ref 12.0–15.0)
MCH: 30.8 pg (ref 26.0–34.0)
MCHC: 33.2 g/dL (ref 30.0–36.0)
MCV: 92.6 fL (ref 80.0–100.0)
Platelets: 308 10*3/uL (ref 150–400)
RBC: 5.3 MIL/uL — ABNORMAL HIGH (ref 3.87–5.11)
RDW: 14.1 % (ref 11.5–15.5)
WBC: 15.1 10*3/uL — ABNORMAL HIGH (ref 4.0–10.5)
nRBC: 0 % (ref 0.0–0.2)

## 2023-04-13 MED ORDER — POTASSIUM CHLORIDE CRYS ER 20 MEQ PO TBCR
40.0000 meq | EXTENDED_RELEASE_TABLET | Freq: Once | ORAL | Status: AC
  Administered 2023-04-13: 40 meq via ORAL
  Filled 2023-04-13: qty 2

## 2023-04-13 MED ORDER — INSULIN GLARGINE-YFGN 100 UNIT/ML ~~LOC~~ SOLN
10.0000 [IU] | Freq: Every day | SUBCUTANEOUS | Status: DC
  Administered 2023-04-13 – 2023-04-15 (×3): 10 [IU] via SUBCUTANEOUS
  Filled 2023-04-13 (×4): qty 0.1

## 2023-04-13 NOTE — Progress Notes (Signed)
Physical Therapy Treatment Patient Details Name: Michele [name] MRN: [id number] DOB: 1971-02-04 Today's Date: 04/13/2023   History of Present Illness Pt is a 53 year old woman admitted on 1/17 with acute on chronic respiratory failure in the setting influenza A PNA. Intubated 1/17-1/22 with agitation complicating hospital course. PMH: COPD, tobacco use, OSA on bipap, DM.    PT Comments  Good progress towards acute functional goals today. Supervision for bed mobility and transfer with RW for support. CGA to ambulate up to 60 feet with RW, minor instability, slow and guarded but without overt LOB. Patient maintained 90% SpO2 on 10L supplemental O2 via HFNC. VSS throughout. Reviewed LE exercises. Encouraged OOB frequently with staff. Patient will continue to benefit from skilled physical therapy services to further improve independence with functional mobility.     If plan is discharge home, recommend the following: A little help with walking and/or transfers;A little help with bathing/dressing/bathroom;Assistance with cooking/housework;Direct supervision/assist for medications management;Direct supervision/assist for financial management;Assist for transportation;Help with stairs or ramp for entrance   Can travel by private vehicle        Equipment Recommendations  Rolling walker (2 wheels) (May progress to no needs.)    Recommendations for Other Services       Precautions / Restrictions Precautions Precautions: Fall Precaution Comments: Monitor O2 Restrictions Weight Bearing Restrictions Per Provider Order: No     Mobility  Bed Mobility Overal bed mobility: Needs Assistance Bed Mobility: Supine to Sit     Supine to sit: Supervision     General bed mobility comments: Supervision for safety, assist to manage lines/leads, awareness.    Transfers Overall transfer level: Needs assistance Equipment used: Rolling walker (2 wheels) Transfers: Sit to/from Stand Sit to Stand:  Supervision           General transfer comment: Supervision for safety, minor weakness and sway noted with standing, RW for support to stabilize.    Ambulation/Gait Ambulation/Gait assistance: Contact guard assist Gait Distance (Feet): 60 Feet Assistive device: Rolling walker (2 wheels) Gait Pattern/deviations: Step-through pattern, Decreased stride length, Drifts right/left Gait velocity: dec Gait velocity interpretation: <1.31 ft/sec, indicative of household ambulator   General Gait Details: Mild instability, slow and guarded but without overt LOB, appears to have mild trendelenburg on Rt. SpO2 maintained 90% and greater on 10L; initially 92% on 15L (O2 tank does not have an option bettwen 10-15L). RW for support. No buckling. Cues for awareness, pacing, RW placement.   Stairs             Wheelchair Mobility     Tilt Bed    Modified Rankin (Stroke Patients Only)       Balance Overall balance assessment: Needs assistance Sitting-balance support: No upper extremity supported, Feet supported Sitting balance-Leahy Scale: Fair       Standing balance-Leahy Scale: Poor Standing balance comment: RW for support. feels more secure                            Cognition Arousal: Alert Behavior During Therapy: [facility] for tasks assessed/performed                                            Exercises General Exercises - Lower Extremity Ankle Circles/Pumps: AROM, Both, 10 reps, Seated Quad Sets: Strengthening, Both, 10 reps, Seated Gluteal Sets: Strengthening, Both,  10 reps, Seated Long Arc Quad: Strengthening, Both, 10 reps, Seated    General Comments        Pertinent Vitals/Pain Pain Assessment Pain Assessment: No/denies pain    Home Living                          Prior Function            PT Goals (current goals can now be found in the care plan section) Acute Rehab PT Goals Patient Stated Goal: get well PT Goal  Formulation: With patient Time For Goal Achievement: 04/26/23 Potential to Achieve Goals: Good Progress towards PT goals: Progressing toward goals    Frequency    Min 1X/week      PT Plan      Co-evaluation              AM-PAC PT "6 Clicks" Mobility   Outcome Measure  Help needed turning from your back to your side while in a flat bed without using bedrails?: A Little Help needed moving from lying on your back to sitting on the side of a flat bed without using bedrails?: A Little Help needed moving to and from a bed to a chair (including a wheelchair)?: A Little Help needed standing up from a chair using your arms (e.g., wheelchair or bedside chair)?: A Little Help needed to walk in hospital room?: A Little Help needed climbing 3-5 steps with a railing? : A Lot 6 Click Score: 17    End of Session Equipment Utilized During Treatment: Gait belt;Oxygen Activity Tolerance: Patient tolerated treatment well Patient left: in chair;with call bell/phone within reach;with SCD's reapplied Nurse Communication: Mobility status (sats) PT Visit Diagnosis: Unsteadiness on feet (R26.81);Other abnormalities of gait and mobility (R26.89);Muscle weakness (generalized) (M62.81)     Time: 0981-1914 PT Time Calculation (min) (ACUTE ONLY): 29 min  Charges:    $Gait Training: 8-22 mins $Therapeutic Activity: 8-22 mins PT General Charges $$ ACUTE PT VISIT: 1 Visit                     Kathlyn Sacramento, PT, DPT [facility]  Rehabilitation Services Physical Therapist Office: [phone] Website: [email or web address]    Berton Mount 04/13/2023, 4:27 PM

## 2023-04-13 NOTE — Progress Notes (Signed)
  Progress Note   Patient: Michele [name] UJW:[id number] DOB: 02-11-1971 DOA: 04/06/2023     7 DOS: the patient was seen and examined on 04/13/2023   Brief hospital course: 53 year old female here with acute on chronic hypoxic/hypercapnic respiratory failure in the setting of influenza pneumonia and haemophilus influenza pneumonia. Patient was extubated 04/11/2023. Pt remain on high flow nasal cannula oxygen.   Assessment and Plan: Acute on chronic hypoxic/hypercapnic resp failure due to COPD exacerbation  - Pulmicort/brovana bid  - IV ceftriaxone 2 g daily  - Robitussin 10 mL PO q6 hr  - Duoneb q4 hr PRN  - Yupelri 175 mcg daily  - Tapering doses of prednisone  - Taper high flow as tolerated   Influenza PNA  - As above   H. Influenza PNA  - As above   Prediabetes  - A1c 6.2 on 04/07/2023  - Novolog SS q4  - Semglee 10 units sq daily   Morbid obesity  - BMI 35.48 kg/m2  - Complicating care        Subjective: Pt seen and examined at the bedside. She is stable on the high flow and we are trying to taper this off as tolerated. Pt continues w/ IV antibx and breathing tx's.   Physical Exam: Vitals:   04/13/23 0400 04/13/23 0500 04/13/23 0739 04/13/23 1124  BP: 139/84 123/76    Pulse: 77 61    Resp: 18 (!) 21    Temp: 98.1 F (36.7 C)  98.2 F (36.8 C) 98.1 F (36.7 C)  TempSrc: Oral  Oral Oral  SpO2: 92% 96%    Weight:  88 kg    Height:       Physical Exam HENT:     Head: Normocephalic.     Mouth/Throat:     Mouth: Mucous membranes are moist.  Cardiovascular:     Rate and Rhythm: Normal rate and regular rhythm.  Pulmonary:     Comments: Coarse breath sounds b/l Abdominal:     Palpations: Abdomen is soft.  Musculoskeletal:     Cervical back: Neck supple.  Skin:    General: Skin is warm.  Neurological:     Mental Status: She is alert. Mental status is at baseline.  Psychiatric:        Mood and Affect: Mood normal.        Disposition: Status is:  Inpatient Remains inpatient appropriate because: pt is on high flow and IV antibx/breathing tx  Planned Discharge Destination:  Dispo per pt clinical progress     Time spent: 35 minutes  Author: Baron Hamper , MD 04/13/2023 11:51 AM  For on call review [email or web address].

## 2023-04-13 NOTE — Plan of Care (Signed)
Problem: Education: Goal: Knowledge of General Education information will improve Description: Including pain rating scale, medication(s)/side effects and non-pharmacologic comfort measures Outcome: Progressing   Problem: Clinical Measurements: Goal: Ability to maintain clinical measurements within normal limits will improve Outcome: Progressing Goal: Respiratory complications will improve Outcome: Progressing Goal: Cardiovascular complication will be avoided Outcome: Progressing   Problem: Activity: Goal: Risk for activity intolerance will decrease Outcome: Progressing   Problem: Nutrition: Goal: Adequate nutrition will be maintained Outcome: Progressing

## 2023-04-14 DIAGNOSIS — J441 Chronic obstructive pulmonary disease with (acute) exacerbation: Secondary | ICD-10-CM | POA: Diagnosis not present

## 2023-04-14 LAB — BASIC METABOLIC PANEL
Anion gap: 10 (ref 5–15)
BUN: 17 mg/dL (ref 6–20)
CO2: 34 mmol/L — ABNORMAL HIGH (ref 22–32)
Calcium: 9.1 mg/dL (ref 8.9–10.3)
Chloride: 96 mmol/L — ABNORMAL LOW (ref 98–111)
Creatinine, Ser: 0.56 mg/dL (ref 0.44–1.00)
GFR, Estimated: >60 mL/min (ref >60)
Glucose, Bld: 86 mg/dL (ref 70–99)
Potassium: 3.9 mmol/L (ref 3.5–5.1)
Sodium: 140 mmol/L (ref 135–145)

## 2023-04-14 LAB — GLUCOSE, CAPILLARY
Glucose-Capillary: 121 mg/dL — ABNORMAL HIGH (ref 70–99)
Glucose-Capillary: 126 mg/dL — ABNORMAL HIGH (ref 70–99)
Glucose-Capillary: 128 mg/dL — ABNORMAL HIGH (ref 70–99)
Glucose-Capillary: 219 mg/dL — ABNORMAL HIGH (ref 70–99)
Glucose-Capillary: 97 mg/dL (ref 70–99)
Glucose-Capillary: 98 mg/dL (ref 70–99)

## 2023-04-14 LAB — MAGNESIUM: Magnesium: 2.1 mg/dL (ref 1.7–2.4)

## 2023-04-14 NOTE — Progress Notes (Addendum)
Progress Note   Patient: Michele [name] ZOX:[id number] DOB: 03/31/1970 DOA: 04/06/2023     8 DOS: the patient was seen and examined on 04/14/2023    Brief hospital course: 53 year old female here with acute on chronic hypoxic/hypercapnic respiratory failure in the setting of influenza pneumonia and haemophilus influenza pneumonia. Patient was extubated 04/11/2023.  Currently on 12 L of intranasal oxygen   Assessment and Plan: Acute on chronic hypoxic/hypercapnic resp failure due to COPD exacerbation  Continue Pulmicort/brovana bid  Continue IV ceftriaxone 2 g daily  Continue Robitussin 10 mL PO q6 hr  Continue Duoneb q4 hr PRN  Continue Yupelri 175 mcg daily  Continue tapering off prednisone Have been weaned off high flow nasal oxygen to intranasal oxygen   Influenza PNA  Continue management as above    Prediabetes  - A1c 6.2 on 04/07/2023  Continue Semglee as well as sliding scale   Morbid obesity  - BMI 35.48 kg/m2  - Complicating care  Will counsel on weight loss with diet and exercise when medically stable   Tobacco abuse Continue nicotine patch   Anxiety disorder-continue sertraline     Subjective:  Seen and examined at bedside this morning She tells me she continues to improve Currently on 12 L of intranasal oxygen Denies chest pain worsening shortness of breath nausea vomiting   Physical Exam: General: Sitting up in bed in mild respiratory distress on intranasal oxygen    Head: Normocephalic.     Mouth/Throat:     Mouth: Mucous membranes are moist.  Cardiovascular:     Rate and Rhythm: Normal rate and regular rhythm.  Pulmonary:     Comments: Creased air entry especially at the bases Abdominal:     Palpations: Abdomen is soft.  Musculoskeletal:     Cervical back: Neck supple.  Skin:    General: Skin is warm.  Neurological:     Mental Status: She is alert. Mental status is at baseline.  Psychiatric:        Mood and Affect: Mood normal.  Vitals:    04/14/23 1025 04/14/23 1104 04/14/23 1122 04/14/23 1140  BP: (!) 148/112   113/70  Pulse: (!) 103 79  68  Resp: 19 (!) 22  19  Temp:   98.8 F (37.1 C)   TempSrc:   Oral   SpO2: (!) 89% 91%  92%  Weight:      Height:        Data Reviewed:    Latest Ref Rng & Units 04/14/2023    4:29 AM 04/13/2023    5:06 AM 04/12/2023    4:23 AM  BMP  Glucose 70 - 99 mg/dL 86  811  914   BUN 6 - 20 mg/dL 17  21  19    Creatinine 0.44 - 1.00 mg/dL 7.82  9.56  2.13   Sodium 135 - 145 mmol/L 140  138  139   Potassium 3.5 - 5.1 mmol/L 3.9  3.1  4.5   Chloride 98 - 111 mmol/L 96  95  101   CO2 22 - 32 mmol/L 34  32  29   Calcium 8.9 - 10.3 mg/dL 9.1  9.4  9.5        Latest Ref Rng & Units 04/13/2023    5:06 AM 04/12/2023    4:23 AM 04/11/2023    9:00 AM  CBC  WBC 4.0 - 10.5 K/uL 15.1  17.8    Hemoglobin 12.0 - 15.0 g/dL 08.6  57.8  14.6  Hematocrit 36.0 - 46.0 % 49.1  48.5  43.0   Platelets 150 - 400 K/uL 308  269       Time spent: 55 minutes  Author: Loyce Dys, MD 04/14/2023 3:01 PM  For on call review [email or web address].

## 2023-04-14 NOTE — Plan of Care (Signed)
  Problem: Education: Goal: Knowledge of General Education information will improve Description: Including pain rating scale, medication(s)/side effects and non-pharmacologic comfort measures Outcome: Progressing   Problem: Health Behavior/Discharge Planning: Goal: Ability to manage health-related needs will improve Outcome: Progressing   Problem: Activity: Goal: Risk for activity intolerance will decrease Outcome: Progressing   Problem: Nutrition: Goal: Adequate nutrition will be maintained Outcome: Progressing   Problem: Coping: Goal: Level of anxiety will decrease Outcome: Progressing   Problem: Elimination: Goal: Will not experience complications related to bowel motility Outcome: Progressing Goal: Will not experience complications related to urinary retention Outcome: Progressing   Problem: Pain Managment: Goal: General experience of comfort will improve and/or be controlled Outcome: Progressing   Problem: Safety: Goal: Ability to remain free from injury will improve Outcome: Progressing

## 2023-04-14 NOTE — Progress Notes (Signed)
   04/13/23 2145  BiPAP/CPAP/SIPAP  Reason BIPAP/CPAP not in use Non-compliant   Patient condition improved, patient does not want to wear tonight. Bipap is on standby.

## 2023-04-14 NOTE — Plan of Care (Signed)
  Problem: Education: Goal: Knowledge of General Education information will improve Description: Including pain rating scale, medication(s)/side effects and non-pharmacologic comfort measures Outcome: Progressing   Problem: Health Behavior/Discharge Planning: Goal: Ability to manage health-related needs will improve Outcome: Progressing   Problem: Clinical Measurements: Goal: Ability to maintain clinical measurements within normal limits will improve Outcome: Progressing Goal: Will remain free from infection Outcome: Progressing Goal: Diagnostic test results will improve Outcome: Progressing Goal: Respiratory complications will improve Outcome: Progressing Goal: Cardiovascular complication will be avoided Outcome: Progressing   Problem: Activity: Goal: Risk for activity intolerance will decrease Outcome: Progressing   Problem: Nutrition: Goal: Adequate nutrition will be maintained Outcome: Progressing   Problem: Coping: Goal: Level of anxiety will decrease Outcome: Progressing   Problem: Elimination: Goal: Will not experience complications related to bowel motility Outcome: Progressing Goal: Will not experience complications related to urinary retention Outcome: Progressing   Problem: Pain Managment: Goal: General experience of comfort will improve and/or be controlled Outcome: Progressing   Problem: Safety: Goal: Ability to remain free from injury will improve Outcome: Progressing   Problem: Skin Integrity: Goal: Risk for impaired skin integrity will decrease Outcome: Progressing   Problem: Coping: Goal: Ability to adjust to condition or change in health will improve Outcome: Progressing   Problem: Fluid Volume: Goal: Ability to maintain a balanced intake and output will improve Outcome: Progressing   Problem: Health Behavior/Discharge Planning: Goal: Ability to identify and utilize available resources and services will improve Outcome: Progressing Goal:  Ability to manage health-related needs will improve Outcome: Progressing   Problem: Metabolic: Goal: Ability to maintain appropriate glucose levels will improve Outcome: Progressing   Problem: Nutritional: Goal: Maintenance of adequate nutrition will improve Outcome: Progressing   Problem: Skin Integrity: Goal: Risk for impaired skin integrity will decrease Outcome: Progressing   Problem: Tissue Perfusion: Goal: Adequacy of tissue perfusion will improve Outcome: Progressing

## 2023-04-15 DIAGNOSIS — J441 Chronic obstructive pulmonary disease with (acute) exacerbation: Secondary | ICD-10-CM | POA: Diagnosis not present

## 2023-04-15 LAB — GLUCOSE, CAPILLARY
Glucose-Capillary: 100 mg/dL — ABNORMAL HIGH (ref 70–99)
Glucose-Capillary: 110 mg/dL — ABNORMAL HIGH (ref 70–99)
Glucose-Capillary: 135 mg/dL — ABNORMAL HIGH (ref 70–99)
Glucose-Capillary: 281 mg/dL — ABNORMAL HIGH (ref 70–99)
Glucose-Capillary: 87 mg/dL (ref 70–99)
Glucose-Capillary: 93 mg/dL (ref 70–99)

## 2023-04-15 LAB — CBC
HCT: 48 % — ABNORMAL HIGH (ref 36.0–46.0)
Hemoglobin: 15.3 g/dL — ABNORMAL HIGH (ref 12.0–15.0)
MCH: 30.9 pg (ref 26.0–34.0)
MCHC: 31.9 g/dL (ref 30.0–36.0)
MCV: 97 fL (ref 80.0–100.0)
Platelets: 273 10*3/uL (ref 150–400)
RBC: 4.95 MIL/uL (ref 3.87–5.11)
RDW: 13.5 % (ref 11.5–15.5)
WBC: 15.9 10*3/uL — ABNORMAL HIGH (ref 4.0–10.5)
nRBC: 0 % (ref 0.0–0.2)

## 2023-04-15 LAB — BASIC METABOLIC PANEL
Anion gap: 7 (ref 5–15)
BUN: 11 mg/dL (ref 6–20)
CO2: 38 mmol/L — ABNORMAL HIGH (ref 22–32)
Calcium: 8.7 mg/dL — ABNORMAL LOW (ref 8.9–10.3)
Chloride: 94 mmol/L — ABNORMAL LOW (ref 98–111)
Creatinine, Ser: 0.5 mg/dL (ref 0.44–1.00)
GFR, Estimated: >60 mL/min (ref >60)
Glucose, Bld: 90 mg/dL (ref 70–99)
Potassium: 3.5 mmol/L (ref 3.5–5.1)
Sodium: 139 mmol/L (ref 135–145)

## 2023-04-15 MED ORDER — SODIUM CHLORIDE 0.9 % IN NEBU
3.0000 mL | INHALATION_SOLUTION | Freq: Three times a day (TID) | RESPIRATORY_TRACT | Status: DC | PRN
Start: 2023-04-15 — End: 2023-04-19

## 2023-04-15 MED ORDER — HYDROCODONE BIT-HOMATROP MBR 5-1.5 MG/5ML PO SOLN: 5.0000 mL | ORAL | Status: DC | PRN

## 2023-04-15 MED ORDER — GUAIFENESIN ER 600 MG PO TB12
1200.0000 mg | ORAL_TABLET | Freq: Two times a day (BID) | ORAL | Status: DC
  Administered 2023-04-15 – 2023-04-19 (×9): 1200 mg via ORAL
  Filled 2023-04-15 (×9): qty 2

## 2023-04-15 MED ORDER — BENZONATATE 100 MG PO CAPS
100.0000 mg | ORAL_CAPSULE | Freq: Three times a day (TID) | ORAL | Status: DC
  Administered 2023-04-15 – 2023-04-19 (×12): 100 mg via ORAL
  Filled 2023-04-15 (×15): qty 1

## 2023-04-15 MED ORDER — SODIUM CHLORIDE 3 % IN NEBU
4.0000 mL | INHALATION_SOLUTION | Freq: Every day | RESPIRATORY_TRACT | Status: AC
  Administered 2023-04-15 – 2023-04-17 (×3): 4 mL via RESPIRATORY_TRACT
  Filled 2023-04-15 (×3): qty 15

## 2023-04-15 MED ORDER — ACETAMINOPHEN 325 MG PO TABS
650.0000 mg | ORAL_TABLET | Freq: Four times a day (QID) | ORAL | Status: DC | PRN
  Administered 2023-04-15: 650 mg via ORAL
  Filled 2023-04-15: qty 2

## 2023-04-15 MED ORDER — HYDROCORTISONE (PERIANAL) 2.5 % EX CREA
TOPICAL_CREAM | Freq: Two times a day (BID) | CUTANEOUS | Status: DC
  Administered 2023-04-15: 1 via RECTAL
  Filled 2023-04-15: qty 28.35

## 2023-04-15 MED ORDER — GERHARDT'S BUTT CREAM: TOPICAL_CREAM | Freq: Two times a day (BID) | CUTANEOUS | Status: DC | PRN

## 2023-04-15 MED ORDER — DEXTROMETHORPHAN POLISTIREX ER 30 MG/5ML PO SUER
30.0000 mg | Freq: Two times a day (BID) | ORAL | Status: DC
  Administered 2023-04-15 – 2023-04-19 (×8): 30 mg via ORAL
  Filled 2023-04-15 (×10): qty 5

## 2023-04-15 NOTE — Plan of Care (Signed)
Problem: Education: Goal: Knowledge of General Education information will improve Description: Including pain rating scale, medication(s)/side effects and non-pharmacologic comfort measures Outcome: Progressing   Problem: Health Behavior/Discharge Planning: Goal: Ability to manage health-related needs will improve Outcome: Progressing   Problem: Clinical Measurements: Goal: Ability to maintain clinical measurements within normal limits will improve Outcome: Progressing Goal: Will remain free from infection Outcome: Progressing Goal: Diagnostic test results will improve Outcome: Progressing Goal: Respiratory complications will improve Outcome: Progressing Goal: Cardiovascular complication will be avoided Outcome: Progressing   Problem: Activity: Goal: Risk for activity intolerance will decrease Outcome: Progressing   Problem: Nutrition: Goal: Adequate nutrition will be maintained Outcome: Progressing   Problem: Coping: Goal: Level of anxiety will decrease Outcome: Progressing   Problem: Elimination: Goal: Will not experience complications related to bowel motility Outcome: Progressing Goal: Will not experience complications related to urinary retention Outcome: Progressing   Problem: Pain Managment: Goal: General experience of comfort will improve and/or be controlled Outcome: Progressing   Problem: Safety: Goal: Ability to remain free from injury will improve Outcome: Progressing   Problem: Skin Integrity: Goal: Risk for impaired skin integrity will decrease Outcome: Progressing   Problem: Activity: Goal: Ability to tolerate increased activity will improve Outcome: Progressing   Problem: Respiratory: Goal: Ability to maintain a clear airway and adequate ventilation will improve Outcome: Progressing   Problem: Role Relationship: Goal: Method of communication will improve Outcome: Progressing   Problem: Education: Goal: Ability to describe self-care  measures that may prevent or decrease complications (Diabetes Survival Skills Education) will improve Outcome: Progressing Goal: Individualized Educational Video(s) Outcome: Progressing   Problem: Coping: Goal: Ability to adjust to condition or change in health will improve Outcome: Progressing   Problem: Fluid Volume: Goal: Ability to maintain a balanced intake and output will improve Outcome: Progressing   Problem: Health Behavior/Discharge Planning: Goal: Ability to identify and utilize available resources and services will improve Outcome: Progressing Goal: Ability to manage health-related needs will improve Outcome: Progressing   Problem: Metabolic: Goal: Ability to maintain appropriate glucose levels will improve Outcome: Progressing   Problem: Nutritional: Goal: Maintenance of adequate nutrition will improve Outcome: Progressing Goal: Progress toward achieving an optimal weight will improve Outcome: Progressing   Problem: Skin Integrity: Goal: Risk for impaired skin integrity will decrease Outcome: Progressing   Problem: Tissue Perfusion: Goal: Adequacy of tissue perfusion will improve Outcome: Progressing   Problem: Education: Goal: Ability to describe self-care measures that may prevent or decrease complications (Diabetes Survival Skills Education) will improve Outcome: Progressing Goal: Individualized Educational Video(s) Outcome: Progressing   Problem: Coping: Goal: Ability to adjust to condition or change in health will improve Outcome: Progressing   Problem: Fluid Volume: Goal: Ability to maintain a balanced intake and output will improve Outcome: Progressing   Problem: Health Behavior/Discharge Planning: Goal: Ability to identify and utilize available resources and services will improve Outcome: Progressing Goal: Ability to manage health-related needs will improve Outcome: Progressing   Problem: Metabolic: Goal: Ability to maintain appropriate  glucose levels will improve Outcome: Progressing   Problem: Nutritional: Goal: Maintenance of adequate nutrition will improve Outcome: Progressing Goal: Progress toward achieving an optimal weight will improve Outcome: Progressing   Problem: Skin Integrity: Goal: Risk for impaired skin integrity will decrease Outcome: Progressing   Problem: Tissue Perfusion: Goal: Adequacy of tissue perfusion will improve Outcome: Progressing

## 2023-04-15 NOTE — Progress Notes (Signed)
   04/14/23 2212  BiPAP/CPAP/SIPAP  BiPAP/CPAP/SIPAP Pt Type Adult  Reason BIPAP/CPAP not in use Non-compliant (pt, does not tolerate bipap, not indicated at this time)  BiPAP/CPAP /SiPAP Vitals  Pulse Rate 75  Resp (!) 22  SpO2 94 %  MEWS Score/Color  MEWS Score 1  MEWS Score Color Green

## 2023-04-15 NOTE — Progress Notes (Signed)
[facility] Progress Note Patient: Michele [name] MWU:[id number] DOB: [birth year] DOA: 04/06/2023  DOS: the patient was seen and examined on 04/15/2023  Brief Hospital Course: PMH of obesity, COPD, mood disorder, active smoker, presented to the hospital with complaints of cough and shortness of breath. Found to have acute hypoxic and hypercapnic respiratory failure secondary to influenza A pneumonia with COPD exacerbation. 1/17 Intubated in ER.  Found to be FLU A +. 1/22 extubated; placed on bipap after and had episode on vomiting; was on optiflow and precedex for anxiety 1/23 transfer to hospitalist service.  Was on heated high flow. 1/26 able to come down on 9 L with saturation 92%. Assessment and Plan: Influenza A and H. flu pneumonia  Acute on chronic hypoxic hypercapnic respiratory failure 2/2 flu A pneumonia  Acute on chronic COPD exacerbation, POA Presented with shortness of breath. Resp panel + influenza A.  Required intubation in the ED. Tracheal aspirate Also positive for H. influenzae.  Beta-lactamase negative. Blood cultures negative. Presenting ABG pH 7.12, pCO2 111.  Current episode of vomiting after she was extubated to BiPAP. Currently gradually improving. On 9 LPM with saturation at 92% on 1/26. Appears to be suffering from significant decreased air entry still, steroids are in the process of being weaned off. I will add saline nebulizer again. Adjust her cough suppression medication. Continue flutter valve. Continue current Brovana, Pulmicort, Yupelri. If no improvement may change in nebulizer therapy. Repeat chest x-ray tomorrow if no improvement.  Will Consider resumption of higher dose steroids again if no improvement.  Concern for obstructive sleep apnea Patient most likely has OSA. Patient told me on 1/26 that she has never had sleep study and does not wear CPAP. Would highly recommend her to consider sleep study outpatient and she is currently  agreeable.  Type 2 diabetes mellitus, currently uncontrolled secondary steroid-induced hyperglycemia without any long-term insulin use without any complication. Hemoglobin A1c currently 6.2, in the past was 6.5. Current hyperglycemia secondary to steroid use. Continue sliding scale insulin.   Tobacco abuse nicotine patch.  Recommended patient to quit.  Patient currently agreeable to quit.  Anxiety resume sertraline  Obesity Class 1 Body mass index is 35.93 kg/m.  Placing the pt at higher risk of poor outcomes.  Secondary erythrocytosis/polycythemia   Noted  Subjective: Feeling better.  Breathing better.  Still has significant cough.  Able to come down from 12 L to 9 L at the time of my evaluation.  Saturating 92%.  Physical Exam: General: in Mild distress, No Rash Cardiovascular: S1 and S2 Present, No Murmur Respiratory: Increased respiratory effort, Bilateral Air entry present.  Significantly diminished bilaterally. Bilateral crackles, anterior wheezes Abdomen: Bowel Sound present, No tenderness Extremities: No edema Neuro: Alert and oriented x3, no new focal deficit  Data Reviewed: I have Reviewed nursing notes, Vitals, and Lab results. Since last encounter, pertinent lab results CBC and BMP   . I have ordered test including CBC and BMP  .   Disposition: Status is: Inpatient Remains inpatient appropriate because: Monitor for improvement in oxygenation  enoxaparin (LOVENOX) injection 40 mg Start: 04/06/23 1515 SCDs Start: 04/06/23 1503   Family Communication: No one at bedside Level of care: Progressive   Vitals:   04/15/23 1128 04/15/23 1200 04/15/23 1514 04/15/23 1600  BP: (!) 138/94  129/75   Pulse: 81 80 80   Resp: (!) 25 17 15    Temp:   98.7 F (37.1 C)   TempSrc:   Oral   SpO2: 92%  93% 90% 90%  Weight:      Height:         Author: Lynden Oxford, MD 04/15/2023 4:55 PM  Please look on [email or web address] to find out who is on call.

## 2023-04-15 NOTE — Plan of Care (Signed)
Problem: Clinical Measurements: Goal: Ability to maintain clinical measurements within normal limits will improve Outcome: Progressing Goal: Respiratory complications will improve Outcome: Progressing   Problem: Activity: Goal: Risk for activity intolerance will decrease Outcome: Progressing   Problem: Coping: Goal: Level of anxiety will decrease Outcome: Progressing   Problem: Elimination: Goal: Will not experience complications related to urinary retention Outcome: Progressing

## 2023-04-16 ENCOUNTER — Inpatient Hospital Stay (HOSPITAL_COMMUNITY): Payer: 59

## 2023-04-16 DIAGNOSIS — J441 Chronic obstructive pulmonary disease with (acute) exacerbation: Secondary | ICD-10-CM | POA: Diagnosis not present

## 2023-04-16 LAB — GLUCOSE, CAPILLARY
Glucose-Capillary: 92 mg/dL (ref 70–99)
Glucose-Capillary: 101 mg/dL — ABNORMAL HIGH (ref 70–99)
Glucose-Capillary: 136 mg/dL — ABNORMAL HIGH (ref 70–99)
Glucose-Capillary: 196 mg/dL — ABNORMAL HIGH (ref 70–99)
Glucose-Capillary: 187 mg/dL — ABNORMAL HIGH (ref 70–99)

## 2023-04-16 MED ORDER — PREDNISONE 20 MG PO TABS
20.0000 mg | ORAL_TABLET | Freq: Every day | ORAL | Status: DC
  Administered 2023-04-17: 20 mg via ORAL
  Filled 2023-04-16: qty 1

## 2023-04-16 MED ORDER — CHLORHEXIDINE GLUCONATE CLOTH 2 % EX PADS
6.0000 | MEDICATED_PAD | Freq: Every day | CUTANEOUS | Status: DC
  Administered 2023-04-18: 6 via TOPICAL

## 2023-04-16 MED ORDER — INSULIN ASPART 100 UNIT/ML IJ SOLN
0.0000 [IU] | Freq: Three times a day (TID) | INTRAMUSCULAR | Status: DC
  Administered 2023-04-16: 3 [IU] via SUBCUTANEOUS
  Administered 2023-04-16 – 2023-04-17 (×2): 2 [IU] via SUBCUTANEOUS
  Administered 2023-04-17: 5 [IU] via SUBCUTANEOUS
  Administered 2023-04-17: 2 [IU] via SUBCUTANEOUS
  Administered 2023-04-18: 3 [IU] via SUBCUTANEOUS
  Administered 2023-04-18: 11 [IU] via SUBCUTANEOUS
  Administered 2023-04-19: 8 [IU] via SUBCUTANEOUS

## 2023-04-16 MED ORDER — INSULIN ASPART 100 UNIT/ML IJ SOLN: 0.0000 [IU] | Freq: Every day | INTRAMUSCULAR | Status: DC

## 2023-04-16 MED ORDER — HYDROXYZINE HCL 25 MG PO TABS
25.0000 mg | ORAL_TABLET | Freq: Three times a day (TID) | ORAL | Status: DC | PRN
  Administered 2023-04-16: 25 mg via ORAL
  Filled 2023-04-16: qty 1

## 2023-04-16 NOTE — Progress Notes (Signed)
[facility] Progress Note Patient: Michele [name] ZOX:[id number] DOB: [birth year] DOA: 04/06/2023  DOS: the patient was seen and examined on 04/16/2023  Brief Hospital Course: PMH of obesity, COPD, mood disorder, active smoker, presented to the hospital with complaints of cough and shortness of breath. Found to have acute hypoxic and hypercapnic respiratory failure secondary to influenza A pneumonia with COPD exacerbation. 1/17 Intubated in ER.  Found to be FLU A +. 1/22 extubated; placed on bipap after and had episode on vomiting; was on optiflow and precedex for anxiety 1/23 transfer to hospitalist service.  Was on heated high flow. 1/26 able to come down on 9 L with saturation 92%. Assessment and Plan: Influenza A and H. flu pneumonia  Acute on chronic hypoxic hypercapnic respiratory failure 2/2 flu A pneumonia  Acute on chronic COPD exacerbation, POA Presented with shortness of breath. Resp panel + influenza A.  Required intubation in the ED. Tracheal aspirate Also positive for H. influenzae.  Beta-lactamase negative. Blood cultures negative. Presenting ABG pH 7.12, pCO2 111.  Current episode of vomiting after she was extubated to BiPAP. Currently gradually improving. On 9 LPM with saturation at 92% on 1/26. Appears to be suffering from significant decreased air entry still, steroids are in the process of being weaned off. I will add saline nebulizer again. Adjust her cough suppression medication. Continue flutter valve. Continue current Brovana, Pulmicort, Yupelri. Repeat chest x-ray shows improvement.  Will continue prednisone for longer duration.  Concern for obstructive sleep apnea Patient most likely has OSA. Patient told me on 1/26 that she has never had sleep study and does not wear CPAP. Would highly recommend her to consider sleep study outpatient and she is currently agreeable.  Type 2 diabetes mellitus, currently uncontrolled secondary steroid-induced  hyperglycemia without any long-term insulin use without any complication. Hemoglobin A1c currently 6.2, in the past was 6.5. Current hyperglycemia secondary to steroid use. Continue sliding scale insulin.   Tobacco abuse nicotine patch.  Recommended patient to quit.  Patient currently agreeable to quit.  Anxiety resume sertraline  Obesity Class 1 Body mass index is 35.93 kg/m.  Placing the pt at higher risk of poor outcomes.  Secondary erythrocytosis/polycythemia   Noted  Subjective: Continues to feel better.  Now on 8 L.  No chest pain.  Physical Exam: In moderate distress. Normal respiratory effort. No significant wheezing heard today. Improvement in crackles as well. Bowel sound present.  Data Reviewed: I have Reviewed nursing notes, Vitals, and Lab results. Reviewed CBC and CMP.  Reordered CBC and CMP.  Disposition: Status is: Inpatient Remains inpatient appropriate because: Monitor for improvement in oxygenation  enoxaparin (LOVENOX) injection 40 mg Start: 04/06/23 1515 SCDs Start: 04/06/23 1503   Family Communication: No one at bedside Level of care: Telemetry Medical   Vitals:   04/16/23 0917 04/16/23 1155 04/16/23 1200 04/16/23 1548  BP: (!) 111/94  (!) 129/112   Pulse: 100  99   Resp: (!) 27  15   Temp:  97.8 F (36.6 C)  98.3 F (36.8 C)  TempSrc:  Axillary  Oral  SpO2: 92%  91%   Weight:      Height:         Author: Lynden Oxford, MD 04/16/2023 6:06 PM  Please look on [email or web address] to find out who is on call.

## 2023-04-16 NOTE — Progress Notes (Signed)
Physical Therapy Treatment Patient Details Name: Michele [name] MRN: [id number] DOB: [birth year] Today's Date: 04/16/2023   History of Present Illness Pt is a 53 year old woman admitted on 1/17 with acute on chronic respiratory failure in the setting influenza A PNA. Intubated 1/17-1/22 with agitation complicating hospital course. PMH: COPD, tobacco use, OSA on bipap, DM.    PT Comments  Great progress towards functional goals. Increased ambulatory ability to 300 feet today with RW for support at a close supervision level for safety. Weaned to 6L HFNC and maintained 92% SpO2 while mobilizing. Prior to activity, SpO2 was 91% on 9L HFNC at rest. Patient will continue to benefit from skilled physical therapy services to further improve independence with functional mobility.  Rest: HR 86, SpO2 91% on 9L HFNC, RR 20, BP 111/99    If plan is discharge home, recommend the following: A little help with walking and/or transfers;A little help with bathing/dressing/bathroom;Assistance with cooking/housework;Direct supervision/assist for medications management;Direct supervision/assist for financial management;Assist for transportation;Help with stairs or ramp for entrance   Can travel by private vehicle        Equipment Recommendations  Rolling walker (2 wheels) (May progress to no needs.)    Recommendations for Other Services       Precautions / Restrictions Precautions Precautions: Fall Precaution Comments: Monitor O2 Restrictions Weight Bearing Restrictions Per Provider Order: No     Mobility  Bed Mobility Overal bed mobility: Modified Independent Bed Mobility: Supine to Sit           General bed mobility comments: no physical assist needed    Transfers Overall transfer level: Needs assistance Equipment used: Rolling walker (2 wheels) Transfers: Sit to/from Stand Sit to Stand: Supervision           General transfer comment: Supervision for safety, good power up today,  no physical assist required but supervision provided for safety and reviewed RW placement/set-up.    Ambulation/Gait Ambulation/Gait assistance: Supervision Gait Distance (Feet): 300 Feet Assistive device: Rolling walker (2 wheels) Gait Pattern/deviations: Step-through pattern, Decreased stride length, Drifts right/left Gait velocity: dec Gait velocity interpretation: <1.8 ft/sec, indicate of risk for recurrent falls   General Gait Details: Mild instability, slow but apparent pace improved modestly from prior visit. Able to increase distance significantly. SpO2 on 6L maintained 92% throughout (initially on 8L.) Stablizes herself with RW support, not confident without device yet but improving. Supervison for safety. Cues for breathing techniques and symptom awareness.   Stairs             Wheelchair Mobility     Tilt Bed    Modified Rankin (Stroke Patients Only)       Balance Overall balance assessment: Needs assistance Sitting-balance support: No upper extremity supported, Feet supported Sitting balance-Leahy Scale: Good     Standing balance support: During functional activity, Bilateral upper extremity supported Standing balance-Leahy Scale: Fair                              Cognition Arousal: Alert Behavior During Therapy: WFL for tasks assessed/performed Overall Cognitive Status: Within Functional Limits for tasks assessed                                          Exercises General Exercises - Lower Extremity Ankle Circles/Pumps: AROM, Both, 10 reps, Seated Long Arc Quad:  Strengthening, Both, 10 reps, Seated    General Comments General comments (skin integrity, edema, etc.): Rest: HR 86, SpO2 91% on 9L HFNC, RR 20, BP 111/99      Pertinent Vitals/Pain Pain Assessment Pain Assessment: No/denies pain    Home Living Family/patient expects to be discharged to:: Private residence Living Arrangements: Spouse/significant  other Available Help at Discharge: Friend(s);Available 24 hours/day Type of Home: House Home Access: Stairs to enter Entrance Stairs-Rails: None Entrance Stairs-Number of Steps: 5   Home Layout: One level Home Equipment: Cane - single point      Prior Function            PT Goals (current goals can now be found in the care plan section) Acute Rehab PT Goals Patient Stated Goal: get well PT Goal Formulation: With patient Time For Goal Achievement: 04/26/23 Potential to Achieve Goals: Good Progress towards PT goals: Progressing toward goals    Frequency    Min 1X/week      PT Plan      Co-evaluation              AM-PAC PT "6 Clicks" Mobility   Outcome Measure  Help needed turning from your back to your side while in a flat bed without using bedrails?: None Help needed moving from lying on your back to sitting on the side of a flat bed without using bedrails?: A Little Help needed moving to and from a bed to a chair (including a wheelchair)?: A Little Help needed standing up from a chair using your arms (e.g., wheelchair or bedside chair)?: A Little Help needed to walk in hospital room?: A Little Help needed climbing 3-5 steps with a railing? : A Little 6 Click Score: 19    End of Session Equipment Utilized During Treatment: Gait belt;Oxygen Activity Tolerance: Patient tolerated treatment well Patient left: in chair;with call bell/phone within reach;with SCD's reapplied Nurse Communication: Mobility status (sats) PT Visit Diagnosis: Unsteadiness on feet (R26.81);Other abnormalities of gait and mobility (R26.89);Muscle weakness (generalized) (M62.81)     Time: 1610-9604 PT Time Calculation (min) (ACUTE ONLY): 17 min  Charges:    $Gait Training: 8-22 mins PT General Charges $$ ACUTE PT VISIT: 1 Visit                     Kathlyn [place], PT, DPT [facility]  Rehabilitation Services Physical Therapist Office: [phone] Website:  [email or web address]    Berton Mount 04/16/2023, 1:05 PM

## 2023-04-16 NOTE — Plan of Care (Signed)
  Problem: Education: Goal: Knowledge of General Education information will improve Description: Including pain rating scale, medication(s)/side effects and non-pharmacologic comfort measures Outcome: Progressing   Problem: Health Behavior/Discharge Planning: Goal: Ability to manage health-related needs will improve Outcome: Progressing   Problem: Clinical Measurements: Goal: Ability to maintain clinical measurements within normal limits will improve Outcome: Progressing Goal: Will remain free from infection Outcome: Progressing Goal: Diagnostic test results will improve Outcome: Progressing Goal: Respiratory complications will improve Outcome: Progressing Goal: Cardiovascular complication will be avoided Outcome: Progressing   Problem: Activity: Goal: Risk for activity intolerance will decrease Outcome: Progressing   Problem: Nutrition: Goal: Adequate nutrition will be maintained Outcome: Progressing   Problem: Coping: Goal: Level of anxiety will decrease Outcome: Progressing   Problem: Elimination: Goal: Will not experience complications related to bowel motility Outcome: Progressing Goal: Will not experience complications related to urinary retention Outcome: Progressing   Problem: Pain Managment: Goal: General experience of comfort will improve and/or be controlled Outcome: Progressing   Problem: Safety: Goal: Ability to remain free from injury will improve Outcome: Progressing   Problem: Skin Integrity: Goal: Risk for impaired skin integrity will decrease Outcome: Progressing   Problem: Activity: Goal: Ability to tolerate increased activity will improve Outcome: Progressing   Problem: Education: Goal: Individualized Educational Video(s) Outcome: Progressing   Problem: Coping: Goal: Ability to adjust to condition or change in health will improve Outcome: Progressing   Problem: Fluid Volume: Goal: Ability to maintain a balanced intake and output will  improve Outcome: Progressing   Problem: Health Behavior/Discharge Planning: Goal: Ability to identify and utilize available resources and services will improve Outcome: Progressing Goal: Ability to manage health-related needs will improve Outcome: Progressing   Problem: Metabolic: Goal: Ability to maintain appropriate glucose levels will improve Outcome: Progressing   Problem: Skin Integrity: Goal: Risk for impaired skin integrity will decrease Outcome: Progressing   Problem: Education: Goal: Individualized Educational Video(s) Outcome: Progressing   Problem: Coping: Goal: Ability to adjust to condition or change in health will improve Outcome: Progressing   Problem: Health Behavior/Discharge Planning: Goal: Ability to identify and utilize available resources and services will improve Outcome: Progressing Goal: Ability to manage health-related needs will improve Outcome: Progressing   Problem: Metabolic: Goal: Ability to maintain appropriate glucose levels will improve Outcome: Progressing   Problem: Nutritional: Goal: Maintenance of adequate nutrition will improve Outcome: Progressing Goal: Progress toward achieving an optimal weight will improve Outcome: Progressing   Problem: Skin Integrity: Goal: Risk for impaired skin integrity will decrease Outcome: Progressing

## 2023-04-16 NOTE — Progress Notes (Signed)
Occupational Therapy Treatment Patient Details Name: Michele [name] MRN: [id number] DOB: 08-01-1970 Today's Date: 04/16/2023   History of present illness Pt is a 53 year old woman admitted on 1/17 with acute on chronic respiratory failure in the setting influenza A PNA. Intubated 1/17-1/22 with agitation complicating hospital course. PMH: COPD, tobacco use, OSA on bipap, DM.   OT comments  Pt making very good progress toward all goals. Pt navigated room with rolling walker and supervision to mod I to complete all toileting, LE dressing, groom at sink and feeding. Pt continues to need high flow O2 at 9L today with O2 sats between 90%-93%.  Will continue to see with focus on energy conservation education now that pt at S to mod I with all basic adls.       If plan is discharge home, recommend the following:  Assistance with cooking/housework;Assist for transportation;Help with stairs or ramp for entrance   Equipment Recommendations  Tub/shower seat    Recommendations for Other Services      Precautions / Restrictions Precautions Precautions: Fall Precaution Comments: Monitor O2 Restrictions Weight Bearing Restrictions Per Provider Order: No       Mobility Bed Mobility Overal bed mobility: Modified Independent             General bed mobility comments: no physical assist needed    Transfers Overall transfer level: Needs assistance Equipment used: Rolling walker (2 wheels) Transfers: Sit to/from Stand, Bed to chair/wheelchair/BSC Sit to Stand: Supervision           General transfer comment: cues to push up from bed     Balance Overall balance assessment: Needs assistance Sitting-balance support: No upper extremity supported, Feet supported Sitting balance-Leahy Scale: Good     Standing balance support: During functional activity, Bilateral upper extremity supported Standing balance-Leahy Scale: Fair Standing balance comment: Pt can stand for short amounts of  time to manage clothing without assist but for anything dynamic pt is more secure and safe with rolling walker.                           ADL either performed or assessed with clinical judgement   ADL Overall ADL's : Needs assistance/impaired Eating/Feeding: Independent;Sitting   Grooming: Wash/dry hands;Wash/dry face;Oral care;Modified independent;Standing Grooming Details (indicate cue type and reason): pt stood at sink for 5 minutes to groom without assist using walker.             Lower Body Dressing: Modified independent;Sit to/from stand Lower Body Dressing Details (indicate cue type and reason): Pt donned pants, socks and shoes sitting EOB and standing with mod I Toilet Transfer: Supervision/safety;Ambulation;Comfort height toilet;Grab bars;Rolling walker (2 wheels) Toilet Transfer Details (indicate cue type and reason): Pt walked tob bathroom and completed all toileting with supervision and walker. Toileting- Clothing Manipulation and Hygiene: Supervision/safety;Sit to/from stand       Functional mobility during ADLs: Supervision/safety;Rolling walker (2 wheels) General ADL Comments: Pt improving with all adls. Pt's biggest limitation is continuing to be on high flow O2 at 9L.    Extremity/Trunk Assessment Upper Extremity Assessment Upper Extremity Assessment: Overall WFL for tasks assessed   Lower Extremity Assessment Lower Extremity Assessment: Defer to PT evaluation        Vision   Vision Assessment?: No apparent visual deficits   Perception Perception Perception: Within Functional Limits   Praxis Praxis Praxis: WFL    Cognition Arousal: Alert Behavior During Therapy: WFL for tasks assessed/performed  Overall Cognitive Status: Within Functional Limits for tasks assessed                                 General Comments: appears to have cleared cognitively        Exercises      Shoulder Instructions       General Comments  Pt making good improvements with all adls.    Pertinent Vitals/ Pain       Pain Assessment Pain Assessment: No/denies pain  Home Living                                          Prior Functioning/Environment              Frequency  Min 1X/week        Progress Toward Goals  OT Goals(current goals can now be found in the care plan section)  Progress towards OT goals: Progressing toward goals  Acute Rehab OT Goals OT Goal Formulation: With patient Time For Goal Achievement: 04/26/23 Potential to Achieve Goals: Good ADL Goals Pt Will Perform Grooming: with modified independence;standing Pt Will Perform Lower Body Bathing: with modified independence;sit to/from stand Pt Will Perform Lower Body Dressing: with modified independence;sit to/from stand Pt Will Transfer to Toilet: with modified independence;ambulating Pt Will Perform Toileting - Clothing Manipulation and hygiene: with modified independence;sit to/from stand Pt Will Perform Tub/Shower Transfer: Tub transfer;with supervision;ambulating;shower seat Additional ADL Goal #1: Pt will tolerate 15 minutes of ADL training with 3 rest breaks.  Plan      Co-evaluation                 AM-PAC OT "6 Clicks" Daily Activity     Outcome Measure   Help from another person eating meals?: None Help from another person taking care of personal grooming?: None Help from another person toileting, which includes using toliet, bedpan, or urinal?: A Little Help from another person bathing (including washing, rinsing, drying)?: A Little Help from another person to put on and taking off regular upper body clothing?: None Help from another person to put on and taking off regular lower body clothing?: None 6 Click Score: 22    End of Session Equipment Utilized During Treatment: Oxygen  OT Visit Diagnosis: Unsteadiness on feet (R26.81)   Activity Tolerance Patient tolerated treatment well   Patient Left in  bed;with call bell/phone within reach   Nurse Communication Mobility status        Time: 1041-1100 OT Time Calculation (min): 19 min  Charges: OT General Charges $OT Visit: 1 Visit OT Treatments $Self Care/Home Management : 8-22 mins  Hope Budds 04/16/2023, 11:08 AM

## 2023-04-16 NOTE — Progress Notes (Signed)
   04/16/23 0020  BiPAP/CPAP/SIPAP  Reason BIPAP/CPAP not in use Non-compliant (bipap order is not indicated at this time, order is prn, patient doe snot tolerate bipap)

## 2023-04-17 DIAGNOSIS — J441 Chronic obstructive pulmonary disease with (acute) exacerbation: Secondary | ICD-10-CM | POA: Diagnosis not present

## 2023-04-17 LAB — GLUCOSE, CAPILLARY
Glucose-Capillary: 143 mg/dL — ABNORMAL HIGH (ref 70–99)
Glucose-Capillary: 179 mg/dL — ABNORMAL HIGH (ref 70–99)
Glucose-Capillary: 217 mg/dL — ABNORMAL HIGH (ref 70–99)
Glucose-Capillary: 121 mg/dL — ABNORMAL HIGH (ref 70–99)

## 2023-04-17 MED ORDER — PREDNISONE 20 MG PO TABS
50.0000 mg | ORAL_TABLET | Freq: Every day | ORAL | Status: DC
  Administered 2023-04-18 – 2023-04-19 (×2): 50 mg via ORAL
  Filled 2023-04-17 (×2): qty 1

## 2023-04-17 MED ORDER — PREDNISONE 20 MG PO TABS
30.0000 mg | ORAL_TABLET | Freq: Once | ORAL | Status: AC
  Administered 2023-04-17: 30 mg via ORAL
  Filled 2023-04-17: qty 1

## 2023-04-17 NOTE — Progress Notes (Signed)
[facility] Progress Note Patient: Michele [name] HQI:[id number] DOB: 1970/10/13 DOA: 04/06/2023  DOS: the patient was seen and examined on 04/17/2023  Brief Hospital Course: PMH of obesity, COPD, mood disorder, active smoker, presented to the hospital with complaints of cough and shortness of breath. Found to have acute hypoxic and hypercapnic respiratory failure secondary to influenza A pneumonia with COPD exacerbation. 1/17 Intubated in ER.  Found to be FLU A +. 1/22 extubated; placed on bipap after and had episode on vomiting; was on optiflow and precedex for anxiety 1/23 transfer to hospitalist service.  Was on heated high flow. 1/26 able to come down on 9 L with saturation 92%. 1/28 prednisone increased back to 50 mg daily.  Assessment and Plan: Influenza A and H. flu pneumonia  Acute on chronic hypoxic hypercapnic respiratory failure 2/2 flu A pneumonia  Acute on chronic COPD exacerbation, POA Presented with shortness of breath. Resp panel + influenza A.  Required intubation in the ED. Tracheal aspirate Also positive for H. influenzae.  Beta-lactamase negative. Blood cultures negative. Presenting ABG pH 7.12, pCO2 111.  Current episode of vomiting after she was extubated to BiPAP. Currently gradually improving. On 9 LPM with saturation at 92% on 1/26. Appears to be suffering from significant decreased air entry still, steroids are in the process of being weaned off. No significant improvement with additional saline nebulization as well as cough suppression medication and ongoing use of flutter valve and incentive spirometry. Will increase steroids to 50 mg and monitor progression. Continue current Brovana, Pulmicort, Yupelri.  May need a change in her nebulizer therapy if there is no further improvement. Repeat chest x-ray shows improvement.  Concern for obstructive sleep apnea Patient most likely has OSA. Patient told me on 1/26 that she has never had sleep study and  does not wear CPAP. Would highly recommend her to consider sleep study outpatient and she is currently agreeable.  Type 2 diabetes mellitus, currently uncontrolled secondary steroid-induced hyperglycemia without any long-term insulin use without any complication. Hemoglobin A1c currently 6.2, in the past was 6.5. Current hyperglycemia secondary to steroid use. Continue sliding scale insulin.   Tobacco abuse nicotine patch.  Recommended patient to quit.  Patient currently agreeable to quit.  Anxiety resume sertraline  Obesity Class 1 Body mass index is 36.29 kg/m.  Placing the pt at higher risk of poor outcomes.  Secondary erythrocytosis/polycythemia   Noted  Subjective: Reports that she is feeling better but had some headache this morning.  Ongoing shortness of breath and cough.  Physical Exam: In mild distress. Normal effort. Clear to auscultation at the back with very diminished breath sounds. Faint wheeze in the front. Bowel sound present.  No thrush. No edema.  Data Reviewed: I have Reviewed nursing notes, Vitals, and Lab results. Reviewed CBC and CMP.  Reordered CBC and BMP.  Disposition: Status is: Inpatient Remains inpatient appropriate because: Monitor for improvement in oxygenation  enoxaparin (LOVENOX) injection 40 mg Start: 04/06/23 1515 SCDs Start: 04/06/23 1503   Family Communication: No one at bedside Level of care: Telemetry Medical   Vitals:   04/17/23 0745 04/17/23 0827 04/17/23 1203 04/17/23 1540  BP:  118/78    Pulse:  96    Resp:  18    Temp: 98.5 F (36.9 C)  99.2 F (37.3 C) 98.9 F (37.2 C)  TempSrc: Oral  Oral Oral  SpO2:  96%    Weight:      Height:  Author: Lynden Oxford, MD 04/17/2023 5:49 PM  Please look on [email or web address] to find out who is on call.

## 2023-04-18 DIAGNOSIS — J441 Chronic obstructive pulmonary disease with (acute) exacerbation: Secondary | ICD-10-CM | POA: Diagnosis not present

## 2023-04-18 LAB — CBC WITH DIFFERENTIAL/PLATELET
Abs Immature Granulocytes: 0.16 10*3/uL — ABNORMAL HIGH (ref 0.00–0.07)
Basophils Absolute: 0.1 10*3/uL (ref 0.0–0.1)
Basophils Relative: 0 %
Eosinophils Absolute: 0.1 10*3/uL (ref 0.0–0.5)
Eosinophils Relative: 0 %
HCT: 47.1 % — ABNORMAL HIGH (ref 36.0–46.0)
Hemoglobin: 15.4 g/dL — ABNORMAL HIGH (ref 12.0–15.0)
Immature Granulocytes: 1 %
Lymphocytes Relative: 26 %
Lymphs Abs: 4.7 10*3/uL — ABNORMAL HIGH (ref 0.7–4.0)
MCH: 31 pg (ref 26.0–34.0)
MCHC: 32.7 g/dL (ref 30.0–36.0)
MCV: 94.8 fL (ref 80.0–100.0)
Monocytes Absolute: 1.2 10*3/uL — ABNORMAL HIGH (ref 0.1–1.0)
Monocytes Relative: 7 %
Neutro Abs: 11.8 10*3/uL — ABNORMAL HIGH (ref 1.7–7.7)
Neutrophils Relative %: 66 %
Platelets: 269 10*3/uL (ref 150–400)
RBC: 4.97 MIL/uL (ref 3.87–5.11)
RDW: 13.2 % (ref 11.5–15.5)
WBC: 18 10*3/uL — ABNORMAL HIGH (ref 4.0–10.5)
nRBC: 0 % (ref 0.0–0.2)

## 2023-04-18 LAB — COMPREHENSIVE METABOLIC PANEL
ALT: 52 U/L — ABNORMAL HIGH (ref 0–44)
AST: 15 U/L (ref 15–41)
Albumin: 3.1 g/dL — ABNORMAL LOW (ref 3.5–5.0)
Alkaline Phosphatase: 57 U/L (ref 38–126)
Anion gap: 9 (ref 5–15)
BUN: 8 mg/dL (ref 6–20)
CO2: 37 mmol/L — ABNORMAL HIGH (ref 22–32)
Calcium: 9 mg/dL (ref 8.9–10.3)
Chloride: 89 mmol/L — ABNORMAL LOW (ref 98–111)
Creatinine, Ser: 0.52 mg/dL (ref 0.44–1.00)
GFR, Estimated: >60 mL/min (ref >60)
Glucose, Bld: 112 mg/dL — ABNORMAL HIGH (ref 70–99)
Potassium: 3.8 mmol/L (ref 3.5–5.1)
Sodium: 135 mmol/L (ref 135–145)
Total Bilirubin: 0.7 mg/dL (ref 0.0–1.2)
Total Protein: 6.7 g/dL (ref 6.5–8.1)

## 2023-04-18 LAB — GLUCOSE, CAPILLARY
Glucose-Capillary: 107 mg/dL — ABNORMAL HIGH (ref 70–99)
Glucose-Capillary: 195 mg/dL — ABNORMAL HIGH (ref 70–99)
Glucose-Capillary: 305 mg/dL — ABNORMAL HIGH (ref 70–99)
Glucose-Capillary: 112 mg/dL — ABNORMAL HIGH (ref 70–99)

## 2023-04-18 LAB — MAGNESIUM: Magnesium: 2 mg/dL (ref 1.7–2.4)

## 2023-04-18 NOTE — Plan of Care (Signed)
  Problem: Education: Goal: Knowledge of General Education information will improve Description: Including pain rating scale, medication(s)/side effects and non-pharmacologic comfort measures Outcome: Progressing   Problem: Health Behavior/Discharge Planning: Goal: Ability to manage health-related needs will improve Outcome: Progressing   Problem: Clinical Measurements: Goal: Ability to maintain clinical measurements within normal limits will improve Outcome: Progressing Goal: Will remain free from infection Outcome: Progressing Goal: Diagnostic test results will improve Outcome: Progressing Goal: Respiratory complications will improve Outcome: Progressing Goal: Cardiovascular complication will be avoided Outcome: Progressing   Problem: Activity: Goal: Risk for activity intolerance will decrease Outcome: Progressing   Problem: Nutrition: Goal: Adequate nutrition will be maintained Outcome: Progressing   Problem: Coping: Goal: Level of anxiety will decrease Outcome: Progressing   Problem: Elimination: Goal: Will not experience complications related to bowel motility Outcome: Progressing Goal: Will not experience complications related to urinary retention Outcome: Progressing   Problem: Pain Managment: Goal: General experience of comfort will improve and/or be controlled Outcome: Progressing   Problem: Safety: Goal: Ability to remain free from injury will improve Outcome: Progressing   Problem: Skin Integrity: Goal: Risk for impaired skin integrity will decrease Outcome: Progressing   Problem: Activity: Goal: Ability to tolerate increased activity will improve Outcome: Progressing   Problem: Education: Goal: Individualized Educational Video(s) Outcome: Progressing   Problem: Coping: Goal: Ability to adjust to condition or change in health will improve Outcome: Progressing   Problem: Fluid Volume: Goal: Ability to maintain a balanced intake and output will  improve Outcome: Progressing   Problem: Health Behavior/Discharge Planning: Goal: Ability to identify and utilize available resources and services will improve Outcome: Progressing Goal: Ability to manage health-related needs will improve Outcome: Progressing   Problem: Metabolic: Goal: Ability to maintain appropriate glucose levels will improve Outcome: Progressing   Problem: Skin Integrity: Goal: Risk for impaired skin integrity will decrease Outcome: Progressing   Problem: Education: Goal: Individualized Educational Video(s) Outcome: Progressing   Problem: Coping: Goal: Ability to adjust to condition or change in health will improve Outcome: Progressing   Problem: Health Behavior/Discharge Planning: Goal: Ability to identify and utilize available resources and services will improve Outcome: Progressing Goal: Ability to manage health-related needs will improve Outcome: Progressing   Problem: Metabolic: Goal: Ability to maintain appropriate glucose levels will improve Outcome: Progressing   Problem: Nutritional: Goal: Maintenance of adequate nutrition will improve Outcome: Progressing Goal: Progress toward achieving an optimal weight will improve Outcome: Progressing   Problem: Skin Integrity: Goal: Risk for impaired skin integrity will decrease Outcome: Progressing

## 2023-04-18 NOTE — Progress Notes (Signed)
    [facility] Progress Note Patient: Michele [name] ZOX:[id number] DOB: 28-Oct-1970 DOA: 04/06/2023  DOS: the patient was seen and examined on 04/18/2023  Brief Hospital Course: PMH of obesity, COPD, mood disorder, active smoker, presented to the hospital with complaints of cough and shortness of breath. Found to have acute hypoxic and hypercapnic respiratory failure secondary to influenza A pneumonia with COPD exacerbation. 1/17 Intubated in ER.  Found to be FLU A +. 1/22 extubated; placed on bipap after and had episode on vomiting; was on optiflow and precedex for anxiety 1/23 transfer to hospitalist service.  Was on heated high flow 1/28 prednisone increased back to 50 mg daily.  Assessment and Plan: Influenza A and H. flu pneumonia  Acute on chronic hypoxic hypercapnic respiratory failure 2/2 flu A pneumonia  Acute on chronic COPD exacerbation, POA Resp panel + influenza A.  Required intubation in the ED. Tracheal aspirate also positive for H. influenzae.  Beta-lactamase negative. Blood cultures negative. Presenting ABG pH 7.12, pCO2 111 Repeat chest x-ray shows improvement Continue increased dose of steroids, and to start taper pending clinical improvement/oxygen requirement Continue current Brovana, Pulmicort, Yupelri Incentive spirometer/flutter valve Continue supplemental O2, plan to wean off  Concern for obstructive sleep apnea Patient most likely has OSA. Patient told me on 1/26 that she has never had sleep study and does not wear CPAP. Would highly recommend her to consider sleep study outpatient and she is currently agreeable.  Type 2 diabetes mellitus, currently uncontrolled secondary steroid-induced hyperglycemia without any long-term insulin use without any complication. Hemoglobin A1c currently 6.2 Continue sliding scale insulin   Tobacco abuse Nicotine patch.  Recommended patient to quit.  Patient currently agreeable to quit.  Anxiety Resume  sertraline  Obesity Class 1 Body mass index is 36.29 kg/m.  Placing the pt at higher risk of poor outcomes.  Secondary erythrocytosis/polycythemia   Noted  Subjective:  Reports feeling overwhelmed, due to not able to return to work as well as paying her bills/house rent.  Currently now on about 4 L of O2.  Slowly improving.  Teary, feels sad.  Was able to provide emotional support.  Physical Exam: General: NAD, still appears short of breath Cardiovascular: S1, S2 present Respiratory: Diminished breath sounds bilaterally Abdomen: Soft, nontender, nondistended, bowel sounds present Musculoskeletal: No bilateral pedal edema noted Skin: Normal Psychiatry: Teary mood     Disposition: Status is: Inpatient Remains inpatient appropriate because: Monitor for improvement in oxygenation  enoxaparin (LOVENOX) injection 40 mg Start: 04/06/23 1515 SCDs Start: 04/06/23 1503   Family Communication: No one at bedside Level of care: Telemetry Medical   Vitals:   04/18/23 0723 04/18/23 0724 04/18/23 0739 04/18/23 1129  BP:      Pulse: 88     Resp: 20     Temp:   98.3 F (36.8 C) 98.4 F (36.9 C)  TempSrc:   Oral Oral  SpO2: 95% 94%    Weight:      Height:         Author: Briant Cedar, MD 04/18/2023 2:08 PM

## 2023-04-18 NOTE — TOC Progression Note (Addendum)
Transition of Care [facility]) - Progression Note    Patient Details  Name: Michele [name] MRN: [id number] Date of Birth: 05-23-1970  Transition of [facility]) CM/SW Contact  Tom-Johnson, Hershal Coria, RN Phone Number: 04/18/2023, 12:39 PM  Clinical Narrative:     CM spoke with patient at bedside about Disability assistance. Patient states she is currently employed but will not return d/t her O2 needs. CM referred to Financial Counseling for Disability assistance. Patient currently on 4L HFNC and Dyspneic  CM will continue to follow as patient progresses with care towards discharge.  17:00- Financial Navigator will reach out to the patient to assist with disability.  CM will continue to follow.      Expected Discharge Plan:  (TBD) Barriers to Discharge: Continued Medical Work up  Expected Discharge Plan and Services                                               Social Determinants of Health (SDOH) Interventions SDOH Screenings   Food Insecurity: Patient Unable To Answer (04/06/2023)  Housing: Patient Unable To Answer (04/06/2023)  Transportation Needs: Patient Unable To Answer (04/06/2023)  Utilities: Patient Unable To Answer (04/06/2023)  Tobacco Use: High Risk (04/06/2023)    Readmission Risk Interventions    04/17/2023    1:48 PM 03/09/2021   12:15 PM  Readmission Risk Prevention Plan  Post Dischage Appt Complete Complete  Medication Screening Complete Complete  Transportation Screening Complete Complete

## 2023-04-18 NOTE — Progress Notes (Signed)
Physical Therapy Treatment Patient Details Name: Michele [name] MRN: [id number] DOB: 08/13/1970 Today's Date: 04/18/2023   History of Present Illness Pt is a 53 year old woman admitted on 1/17 with acute on chronic respiratory failure in the setting influenza A PNA. Intubated 1/17-1/22 with agitation complicating hospital course. PMH: COPD, tobacco use, OSA on bipap, DM.    PT Comments  Pt making excellent with goals met or nearly met/sufficient for d/c from PT.  She demonstrates mod I transfers with supervision for lines only. Pt with normal gait speed and pattern with good balance.  Pt on 3 L O2 - sats stable with activity when pt focused on breathing and not talking.  Stable on 4 L with talking and activity.   Pt no longer with skilled PT needs.  No PT needs at d/c.     If plan is discharge home, recommend the following: Assistance with cooking/housework;Help with stairs or ramp for entrance   Can travel by private vehicle        Equipment Recommendations  None recommended by PT    Recommendations for Other Services       Precautions / Restrictions Precautions Precautions: Fall     Mobility  Bed Mobility Overal bed mobility: Modified Independent                  Transfers   Equipment used: None Transfers: Sit to/from Stand Sit to Stand: Supervision           General transfer comment: Supervision for lines; stood without difficulty    Ambulation/Gait Ambulation/Gait assistance: Supervision Gait Distance (Feet): 300 Feet Assistive device: None Gait Pattern/deviations: WFL(Within Functional Limits) Gait velocity: normal     General Gait Details: Supervision for lines and cues to focus on breathing   Stairs             Wheelchair Mobility     Tilt Bed    Modified Rankin (Stroke Patients Only)       Balance Overall balance assessment: Needs assistance Sitting-balance support: No upper extremity supported Sitting balance-Leahy Scale:  Normal     Standing balance support: No upper extremity supported Standing balance-Leahy Scale: Good                              Cognition Arousal: Alert Behavior During Therapy: WFL for tasks assessed/performed Overall Cognitive Status: Within Functional Limits for tasks assessed                                          Exercises      General Comments General comments (skin integrity, edema, etc.): Pt on 3 L O2 with sats 93% rest.  Tried 3 L ambulation - pt very kind and talking frequently -sats dropping to 88%.  Increased to 4 L pt maintaining 91% activity and talking.  Decreased back to 3 L and cued to focus on breathing, no talking with activity and sats maintained 90%.  Educated on breathing in nose and focus on breathing with activity.  Pt is aware.      Pertinent Vitals/Pain Pain Assessment Pain Assessment: No/denies pain    Home Living                          Prior Function  PT Goals (current goals can now be found in the care plan section) Acute Rehab PT Goals PT Goal Formulation: All assessment and education complete, DC therapy Progress towards PT goals: Goals met/education completed, patient discharged from PT (goals met or nearly met)    Frequency    Min 1X/week      PT Plan      Co-evaluation              AM-PAC PT "6 Clicks" Mobility   Outcome Measure  Help needed turning from your back to your side while in a flat bed without using bedrails?: None Help needed moving from lying on your back to sitting on the side of a flat bed without using bedrails?: None Help needed moving to and from a bed to a chair (including a wheelchair)?: None Help needed standing up from a chair using your arms (e.g., wheelchair or bedside chair)?: None Help needed to walk in hospital room?: A Little Help needed climbing 3-5 steps with a railing? : A Little 6 Click Score: 22    End of Session Equipment  Utilized During Treatment: Oxygen Activity Tolerance: Patient tolerated treatment well Patient left: in chair;with call bell/phone within reach Nurse Communication: Mobility status PT Visit Diagnosis: Unsteadiness on feet (R26.81);Other abnormalities of gait and mobility (R26.89);Muscle weakness (generalized) (M62.81)     Time: 1610-9604 PT Time Calculation (min) (ACUTE ONLY): 13 min  Charges:    $Gait Training: 8-22 mins PT General Charges $$ ACUTE PT VISIT: 1 Visit                     Anise Salvo, PT Acute Rehab Services [facility] [phone] Humphrey 04/18/2023, 3:53 PM

## 2023-04-19 ENCOUNTER — Other Ambulatory Visit (HOSPITAL_COMMUNITY): Payer: Self-pay

## 2023-04-19 DIAGNOSIS — J441 Chronic obstructive pulmonary disease with (acute) exacerbation: Secondary | ICD-10-CM | POA: Diagnosis not present

## 2023-04-19 LAB — GLUCOSE, CAPILLARY
Glucose-Capillary: 112 mg/dL — ABNORMAL HIGH (ref 70–99)
Glucose-Capillary: 261 mg/dL — ABNORMAL HIGH (ref 70–99)
Glucose-Capillary: 216 mg/dL — ABNORMAL HIGH (ref 70–99)

## 2023-04-19 LAB — BRAIN NATRIURETIC PEPTIDE: B Natriuretic Peptide: 27.1 pg/mL (ref 0.0–100.0)

## 2023-04-19 MED ORDER — IPRATROPIUM-ALBUTEROL 0.5-2.5 (3) MG/3ML IN SOLN
3.0000 mL | RESPIRATORY_TRACT | 0 refills | Status: DC | PRN
  Filled 2023-04-19: qty 360, 20d supply, fill #0

## 2023-04-19 MED ORDER — SERTRALINE HCL 25 MG PO TABS
25.0000 mg | ORAL_TABLET | Freq: Every day | ORAL | 0 refills | Status: DC
  Filled 2023-04-19: qty 30, 30d supply, fill #0

## 2023-04-19 MED ORDER — PREDNISONE 10 MG PO TABS
ORAL_TABLET | ORAL | 0 refills | Status: DC
  Filled 2023-04-19: qty 30, 12d supply, fill #0

## 2023-04-19 MED ORDER — HYDROXYZINE HCL 25 MG PO TABS
25.0000 mg | ORAL_TABLET | Freq: Three times a day (TID) | ORAL | 0 refills | Status: DC | PRN
  Filled 2023-04-19: qty 30, 10d supply, fill #0

## 2023-04-19 MED ORDER — NICOTINE 7 MG/24HR TD PT24
7.0000 mg | MEDICATED_PATCH | Freq: Every day | TRANSDERMAL | 0 refills | Status: DC
  Filled 2023-04-19: qty 28, 28d supply, fill #0

## 2023-04-19 MED ORDER — ORAL CARE MOUTH RINSE: 15.0000 mL | OROMUCOSAL | Status: DC | PRN

## 2023-04-19 MED ORDER — PREDNISONE 10 MG PO TABS
ORAL_TABLET | ORAL | 0 refills | Status: AC
  Filled 2023-04-19: qty 30, 12d supply, fill #0

## 2023-04-19 MED ORDER — BENZONATATE 100 MG PO CAPS
100.0000 mg | ORAL_CAPSULE | Freq: Three times a day (TID) | ORAL | 0 refills | Status: DC
  Filled 2023-04-19: qty 20, 7d supply, fill #0

## 2023-04-19 MED ORDER — ALBUTEROL SULFATE HFA 108 (90 BASE) MCG/ACT IN AERS
2.0000 | INHALATION_SPRAY | RESPIRATORY_TRACT | 0 refills | Status: DC | PRN
  Filled 2023-04-19: qty 18, 17d supply, fill #0

## 2023-04-19 MED ORDER — GUAIFENESIN ER 600 MG PO TB12
1200.0000 mg | ORAL_TABLET | Freq: Two times a day (BID) | ORAL | 0 refills | Status: AC
  Filled 2023-04-19: qty 20, 5d supply, fill #0

## 2023-04-19 MED ORDER — MOMETASONE FURO-FORMOTEROL FUM 200-5 MCG/ACT IN AERO
2.0000 | INHALATION_SPRAY | Freq: Two times a day (BID) | RESPIRATORY_TRACT | 0 refills | Status: DC
  Filled 2023-04-19: qty 13, 30d supply, fill #0

## 2023-04-19 NOTE — Progress Notes (Signed)
Discharge instructions reviewed with patient, all questions answered.  Patient discharged by wheelchair to boyfriends car home. Oxygen tanks and nebulizer sent home with patient.

## 2023-04-19 NOTE — Progress Notes (Signed)
SLP  Note  Patient Details Name: Michele [name] MRN: [id number] DOB: 1970-03-31   Followed up with pt, has been tolerating meals well, enjoying the food here. No need for SLP f/u will sign off   Davena Julian, Riley Nearing 04/19/2023, 10:57 AM

## 2023-04-19 NOTE — Discharge Summary (Signed)
Physician Discharge Summary   Patient: Michele [name] MRN: [id number] DOB: Jan 24, 1971  Admit date:     04/06/2023  Discharge date: 04/19/23  Discharge Physician: Briant Cedar   PCP: Patient, No Pcp Per   Recommendations at discharge:    Follow up with PCP as scheduled  Discharge Diagnoses: Principal Problem:   COPD exacerbation [facility])    Hospital Course: PMH of obesity, COPD, mood disorder, active smoker, presented to the hospital with complaints of cough and shortness of breath. Found to have acute hypoxic and hypercapnic respiratory failure secondary to influenza A pneumonia with COPD exacerbation. On 1/17 Intubated in ER, found to be FLU A +. On 1/22 extubated; placed on bipap, now weaned to 4L of O2. Patient will be discharged with home O2. Restarted another round of prednisone as still SOB with wheezing. Today pt reports feeling better, still requiring O2, but otherwise improved. No wheezing noted. Patient eager to d/c home.   Assessment and Plan: Influenza A and H. flu pneumonia  Acute on chronic hypoxic hypercapnic respiratory failure 2/2 flu A pneumonia  Acute on chronic COPD exacerbation, POA Resp panel + influenza A.  Required intubation in the ED. Tracheal aspirate also positive for H. influenzae.  Beta-lactamase negative. Blood cultures negative. Presenting ABG pH 7.12, pCO2 111 Repeat chest x-ray shows improvement Continue gradual taper of steroids Patient was provided with a nebulizer machine with duonebs Start on dulera inhaler, continue albuterol prn Incentive spirometer/flutter valve Continue supplemental O2 on 3-4L (oxygen provided upon d/c)   Concern for possible obstructive sleep apnea Recommend sleep study outpatient   Type 2 diabetes mellitus, currently uncontrolled secondary steroid-induced hyperglycemia without any long-term insulin use without any complication. Hemoglobin A1c currently 6.2 Diet controlled   Tobacco abuse Nicotine patch.   Recommended patient to quit.  Patient currently agreeable to quit.   Anxiety/depression Resume atarax prn, sertraline   Obesity Class 1 Body mass index is 36.29 kg/m.  Advised lifestyle modification   Secondary erythrocytosis/polycythemia   Noted, follow up with PCP        Consultants: PCCM Procedures performed: Mechanical ventilation Disposition: Home Diet recommendation:  Carb modified diet   DISCHARGE MEDICATION: Allergies as of 04/19/2023   No Known Allergies      Medication List     STOP taking these medications    Spiriva Respimat 2.5 MCG/ACT Aers Generic drug: Tiotropium Bromide Monohydrate       TAKE these medications    acetaminophen 500 MG tablet Commonly known as: TYLENOL Take 500 mg by mouth every 6 (six) hours as needed for mild pain.   albuterol 108 (90 Base) MCG/ACT inhaler Commonly known as: VENTOLIN HFA Inhale 2 puffs into the lungs every 4 (four) hours as needed for wheezing or shortness of breath.   benzonatate 100 MG capsule Commonly known as: TESSALON Take 1 capsule (100 mg total) by mouth 3 (three) times daily.   guaiFENesin 600 MG 12 hr tablet Commonly known as: MUCINEX Take 2 tablets (1,200 mg total) by mouth 2 (two) times daily for 5 days. What changed: how much to take   hydrOXYzine 25 MG tablet Commonly known as: ATARAX Take 1 tablet (25 mg total) by mouth 3 (three) times daily as needed for anxiety.   ipratropium-albuterol 0.5-2.5 (3) MG/3ML Soln Commonly known as: DUONEB Take 3 mLs by nebulization every 4 (four) hours as needed.   mometasone-formoterol 200-5 MCG/ACT Aero Commonly known as: DULERA Inhale 2 puffs into the lungs 2 (two) times daily.  nicotine 7 mg/24hr patch Commonly known as: NICODERM CQ - dosed in mg/24 hr Place 1 patch (7 mg total) onto the skin daily.   predniSONE 10 MG tablet Commonly known as: DELTASONE Take 4 tablets (40 mg total) by mouth daily for 3 days, THEN 3 tablets (30 mg total)  daily for 3 days, THEN 2 tablets (20 mg total) daily for 3 days, THEN 1 tablet (10 mg total) daily for 3 days. Start taking on: April 20, 2023   sertraline 25 MG tablet Commonly known as: ZOLOFT Take 1 tablet (25 mg total) by mouth daily.               Durable Medical Equipment  (From admission, onward)           Start     Ordered   04/19/23 1411  For home use only DME Nebulizer machine  Once       Question Answer Comment  Patient needs a nebulizer to treat with the following condition COPD exacerbation (HCC)   Length of Need Lifetime   Additional equipment included Administration kit      04/19/23 1410   04/19/23 1121  For home use only DME oxygen  Once       Question Answer Comment  Length of Need Lifetime   Mode or (Route) Nasal cannula   Liters per Minute 4   Frequency Continuous (stationary and portable oxygen unit needed)   Oxygen delivery system Gas      04/19/23 1121   04/12/23 1648  For home use only DME Walker rolling  Once       Question Answer Comment  Walker: With 5 Inch Wheels   Patient needs a walker to treat with the following condition Weakness      04/12/23 1647            Discharge Exam: Filed Weights   04/14/23 0500 04/15/23 0445 04/17/23 0500  Weight: 90.1 kg 89.1 kg 90 kg   General: NAD  Cardiovascular: S1, S2 present Respiratory:  Diminished BS b/l Abdomen: Soft, nontender, nondistended, bowel sounds present Musculoskeletal: No bilateral pedal edema noted Skin: Normal Psychiatry: Normal mood   Condition at discharge: stable  The results of significant diagnostics from this hospitalization (including imaging, microbiology, ancillary and laboratory) are listed below for reference.   Imaging Studies: DG CHEST PORT 1 VIEW Result Date: 04/16/2023 CLINICAL DATA:  Pneumonia EXAM: PORTABLE CHEST 1 VIEW COMPARISON:  04/12/2023 FINDINGS: Heart is mildly enlarged. Mediastinal contours within normal limits. Mild interstitial  prominence, slightly improved since prior study. Bibasilar opacities. No effusions or pneumothorax. No acute bony abnormality. IMPRESSION: Borderline heart size with interstitial prominence, improving since prior study, likely improving edema or atypical infection. Bibasilar atelectasis or infiltrates. Electronically Signed   By: Charlett Nose M.D.   On: 04/16/2023 13:40   DG Chest Port 1 View Result Date: 04/12/2023 CLINICAL DATA:  Respiratory distress EXAM: PORTABLE CHEST 1 VIEW COMPARISON:  Chest radiograph dated 04/10/2023 FINDINGS: Lines/tubes: Interval extubation and removal of enteric tube. Lungs: Well inflated lungs. Increased diffuse bilateral interstitial opacities. Pleura: Similar blunting of the bilateral costophrenic angles. No pneumothorax. Heart/mediastinum: Similar mildly enlarged cardiomediastinal silhouette. Bones: No acute osseous abnormality. IMPRESSION: 1. Increased diffuse bilateral interstitial opacities, likely pulmonary edema. 2. Similar blunting of the bilateral costophrenic angles, which may represent trace pleural effusions. 3. Similar mildly enlarged cardiomediastinal silhouette. Electronically Signed   By: Agustin Cree M.D.   On: 04/12/2023 08:44   DG CHEST PORT 1  VIEW Result Date: 04/10/2023 CLINICAL DATA:  Shortness of breath. EXAM: PORTABLE CHEST 1 VIEW COMPARISON:  Radiographs 04/08/2023 and 04/07/2023.  CT 03/03/2021. FINDINGS: 0440 hours. Tip of the endotracheal tube overlies the mid trachea. Enteric tube projects below the diaphragm, tip not visualized. The heart size and mediastinal contours are stable. Persistent vascular congestion with mildly increased asymmetric right lung opacities favoring asymmetric edema. There is no confluent airspace disease, significant pleural effusion or pneumothorax. The bones appear unchanged. IMPRESSION: Persistent vascular congestion with mildly increased asymmetric right lung opacities favoring asymmetric edema. No confluent airspace disease  or pleural effusion. Electronically Signed   By: Carey Bullocks M.D.   On: 04/10/2023 09:10   DG CHEST PORT 1 VIEW Result Date: 04/08/2023 CLINICAL DATA:  Respiratory failure. EXAM: PORTABLE CHEST 1 VIEW COMPARISON:  04/07/2023 FINDINGS: Enteric tube tip courses below the GE junction. ET tube is again seen, the distal portions of the ET tube are obscured by over overlapping support apparatus. Stable cardiomediastinal contours. Pulmonary vascular congestion. Patchy opacity noted in the right base, similar to previous exam. IMPRESSION: 1. No significant change in right base opacity. 2. Pulmonary vascular congestion. Electronically Signed   By: Signa Kell M.D.   On: 04/08/2023 11:45   DG CHEST PORT 1 VIEW Result Date: 04/07/2023 CLINICAL DATA:  Endotracheal tube placement. EXAM: PORTABLE CHEST 1 VIEW COMPARISON:  04/06/2023 FINDINGS: Endotracheal tube has tip approximately 4 cm above the carina. Enteric tube courses into the distal esophagus and off the film as tip is not visualized. Lungs are adequately inflated demonstrate mild prominence of the right infrahilar bronchovascular markings likely due to vascular crowding, although superimposed atelectasis or early infection is possible. No effusion or pneumothorax. Stable cardiomegaly. Remainder of the exam is unchanged. IMPRESSION: 1. Endotracheal tube has tip approximately 4 cm above the carina. 2. Mild prominence of the right infrahilar bronchovascular markings likely due to vascular crowding, although superimposed atelectasis or early infection is possible. Electronically Signed   By: Elberta Fortis M.D.   On: 04/07/2023 07:46   DG Chest Portable 1 View Result Date: 04/06/2023 CLINICAL DATA:  Shortness of breath intubated EXAM: PORTABLE CHEST 1 VIEW COMPARISON:  03/17/2021 FINDINGS: Endotracheal tube tip is about 2.6 cm superior to the carina. Esophageal tube tip below the diaphragm but incompletely visualized. Hyperinflation with emphysema and  bronchitic changes. Difficult to exclude hazy pulmonary infiltrate at the right upper lobe. Borderline to mild cardiomegaly. No pneumothorax IMPRESSION: 1. Endotracheal tube tip about 2.6 cm superior to the carina. 2. Hyperinflation with emphysema and bronchitic changes. Difficult to exclude hazy pulmonary infiltrate at the right upper lobe. Electronically Signed   By: Jasmine Pang M.D.   On: 04/06/2023 14:51    Microbiology: Results for orders placed or performed during the hospital encounter of 04/06/23  Resp panel by RT-PCR (RSV, Flu A&B, Covid) Anterior Nasal Swab     Status: Abnormal   Collection Time: 04/06/23 12:25 PM   Specimen: Anterior Nasal Swab  Result Value Ref Range Status   SARS Coronavirus 2 by RT PCR NEGATIVE NEGATIVE Final   Influenza A by PCR POSITIVE (A) NEGATIVE Final   Influenza B by PCR NEGATIVE NEGATIVE Final    Comment: (NOTE) The Xpert Xpress SARS-CoV-2/FLU/RSV plus assay is intended as an aid in the diagnosis of influenza from Nasopharyngeal swab specimens and should not be used as a sole basis for treatment. Nasal washings and aspirates are unacceptable for Xpert Xpress SARS-CoV-2/FLU/RSV testing.  Fact Sheet for Patients: BloggerCourse.com  Fact Sheet for Healthcare Providers: SeriousBroker.it  This test is not yet approved or cleared by the [place] FDA and has been authorized for detection and/or diagnosis of SARS-CoV-2 by FDA under an Emergency Use Authorization (EUA). This EUA will remain in effect (meaning this test can be used) for the duration of the COVID-19 declaration under Section 564(b)(1) of the Act, 21 U.S.C. section 360bbb-3(b)(1), unless the authorization is terminated or revoked.     Resp Syncytial Virus by PCR NEGATIVE NEGATIVE Final    Comment: (NOTE) Fact Sheet for Patients: BloggerCourse.com  Fact Sheet for Healthcare  Providers: SeriousBroker.it  This test is not yet approved or cleared by the [place] FDA and has been authorized for detection and/or diagnosis of SARS-CoV-2 by FDA under an Emergency Use Authorization (EUA). This EUA will remain in effect (meaning this test can be used) for the duration of the COVID-19 declaration under Section 564(b)(1) of the Act, 21 U.S.C. section 360bbb-3(b)(1), unless the authorization is terminated or revoked.  Performed at [facility], [address] [place], [place] [address]   MRSA Next Gen by PCR, Nasal     Status: None   Collection Time: 04/06/23  5:17 PM   Specimen: Nasal Mucosa; Nasal Swab  Result Value Ref Range Status   MRSA by PCR Next Gen NOT DETECTED NOT DETECTED Final    Comment: (NOTE) The GeneXpert MRSA Assay (FDA approved for NASAL specimens only), is one component of a comprehensive MRSA colonization surveillance program. It is not intended to diagnose MRSA infection nor to guide or monitor treatment for MRSA infections. Test performance is not FDA approved in patients less than 45 years old. Performed at [facility], [address], [place], [place] [address]   C Difficile Quick Screen w PCR reflex     Status: None   Collection Time: 04/09/23 10:24 AM   Specimen: STOOL  Result Value Ref Range Status   C Diff antigen NEGATIVE NEGATIVE Final   C Diff toxin NEGATIVE NEGATIVE Final   C Diff interpretation No C. difficile detected.  Final    Comment: Performed at [facility], [address], [place], [place] [address]  Culture, Respiratory w Gram Stain     Status: None   Collection Time: 04/09/23  3:40 PM   Specimen: Tracheal Aspirate; Respiratory  Result Value Ref Range Status   Specimen Description TRACHEAL ASPIRATE  Final   Special Requests NONE  Final   Gram Stain   Final    FEW WBC PRESENT,BOTH PMN AND MONONUCLEAR MODERATE GRAM NEGATIVE RODS RARE GRAM POSITIVE COCCI IN PAIRS     Culture   Final    ABUNDANT HAEMOPHILUS INFLUENZAE BETA LACTAMASE NEGATIVE Performed at [facility], [address], [place], [place] [address]    Report Status 04/11/2023 FINAL  Final  Respiratory (~20 pathogens) panel by PCR     Status: Abnormal   Collection Time: 04/11/23  4:15 PM   Specimen: Nasopharyngeal Swab; Respiratory  Result Value Ref Range Status   Adenovirus NOT DETECTED NOT DETECTED Final   Coronavirus 229E NOT DETECTED NOT DETECTED Final    Comment: (NOTE) The Coronavirus on the Respiratory Panel, DOES NOT test for the novel  Coronavirus (2019 nCoV)    Coronavirus HKU1 NOT DETECTED NOT DETECTED Final   Coronavirus NL63 NOT DETECTED NOT DETECTED Final   Coronavirus OC43 NOT DETECTED NOT DETECTED Final   Metapneumovirus NOT DETECTED NOT DETECTED Final   Rhinovirus /  Enterovirus NOT DETECTED NOT DETECTED Final   Influenza A H1 2009 DETECTED (A) NOT DETECTED Final   Influenza B NOT DETECTED NOT DETECTED Final   Parainfluenza Virus 1 NOT DETECTED NOT DETECTED Final   Parainfluenza Virus 2 NOT DETECTED NOT DETECTED Final   Parainfluenza Virus 3 NOT DETECTED NOT DETECTED Final   Parainfluenza Virus 4 NOT DETECTED NOT DETECTED Final   Respiratory Syncytial Virus NOT DETECTED NOT DETECTED Final   Bordetella pertussis NOT DETECTED NOT DETECTED Final   Bordetella Parapertussis NOT DETECTED NOT DETECTED Final   Chlamydophila pneumoniae NOT DETECTED NOT DETECTED Final   Mycoplasma pneumoniae NOT DETECTED NOT DETECTED Final    Comment: Performed at [facility], [address] [place], [place] [address]    Labs: CBC: Recent Labs  Lab 04/13/23 0506 04/15/23 0233 04/18/23 0257  WBC 15.1* 15.9* 18.0*  NEUTROABS  --   --  11.8*  HGB 16.3* 15.3* 15.4*  HCT 49.1* 48.0* 47.1*  MCV 92.6 97.0 94.8  PLT 308 273 269   Basic Metabolic Panel: Recent Labs  Lab 04/13/23 0506 04/14/23 0429 04/15/23 0233 04/18/23 0257  NA 138 140 139 135  K 3.1* 3.9 3.5 3.8   CL 95* 96* 94* 89*  CO2 32 34* 38* 37*  GLUCOSE 107* 86 90 112*  BUN 21* 17 11 8   CREATININE 0.52 0.56 0.50 0.52  CALCIUM 9.4 9.1 8.7* 9.0  MG  --  2.1  --  2.0   Liver Function Tests: Recent Labs  Lab 04/18/23 0257  AST 15  ALT 52*  ALKPHOS 57  BILITOT 0.7  PROT 6.7  ALBUMIN 3.1*   CBG: Recent Labs  Lab 04/18/23 1126 04/18/23 1522 04/18/23 2114 04/19/23 0730 04/19/23 1136  GLUCAP 195* 305* 112* 112* 261*    Discharge time spent: greater than 30 minutes.  Signed: Briant Cedar, MD [facility] 04/19/2023

## 2023-04-19 NOTE — Progress Notes (Signed)
SATURATION QUALIFICATIONS: (This note is used to comply with regulatory documentation for home oxygen)  Patient Saturations on 3L at Rest = 91% -   Patient Saturations on Room Air at rest: 79%  Patient Saturations on 3 Liters of oxygen while Ambulating = 84% - increased oxygen 4L oxygen saturation increased to 88%  Please briefly explain why patient needs home oxygen: Patients desaturates while on Room Air

## 2023-04-19 NOTE — Progress Notes (Signed)
Nutrition Follow-up  DOCUMENTATION CODES:   Obesity unspecified  INTERVENTION:  Continue regular diet Encourage PO intake Ensure Enlive po BID, each supplement provides 350 kcal and 20 grams of protein. MVI with minerals daily  NUTRITION DIAGNOSIS:   Inadequate oral intake related to acute illness, inability to eat as evidenced by NPO status. - progressing  GOAL:   Patient will meet greater than or equal to 90% of their needs - improved  MONITOR:   Diet advancement, Labs, Weight trends  REASON FOR ASSESSMENT:   Ventilator, Consult Enteral/tube feeding initiation and management  ASSESSMENT:   53 year old female who presented to the ED on 1/17 with respiratory distress. PMH of COPD, tobacco use, OSA on BiPAP, DM. Pt admitted with acute hypercapnic respiratory failure, COPD exacerbation, influenza A pneumonia.  1/17 - admission, intubated 1/22 - extubated, placed on bipap  Pt likely for discharge today.   Pt sitting up in bedside chair at time of visit. She reports that since being off BiPAP that her taste and therefore appetite has significantly improved. She feels that she is eating very adequately.  Meal completions documented to be 100% x8 recorded meals (1/26-1/30). Per review of MAR, pt noted to have received all scheduled ensure supplements except one this morning.   Admit weight:  91.6 kg Current weight: 90 kg  Medications: SSI 0-15 units TID, SSI 0-5 units at bedtime, MVI, prednisone  Labs:  CBG's 112-305 x24 hours  Diet Order:   Diet Order             Diet regular Fluid consistency: Thin  Diet effective now                   EDUCATION NEEDS:   Not appropriate for education at this time  Skin:  Skin Assessment: Reviewed RN Assessment  Last BM:  1/30 type 4  Height:   Ht Readings from Last 1 Encounters:  04/06/23 5\' 2"  (1.575 m)    Weight:   Wt Readings from Last 1 Encounters:  04/17/23 90 kg    Ideal Body Weight:  50 kg  BMI:   Body mass index is 36.29 kg/m.  Estimated Nutritional Needs:   Kcal:  1800-2000  Protein:  100-115 grams  Fluid:  1.8-2.0 L  Drusilla Kanner, RDN, LDN Clinical Nutrition

## 2023-04-19 NOTE — Progress Notes (Addendum)
Occupational Therapy Treatment and Discharge Patient Details Name: Michele [name] MRN: [id number] DOB: 11/06/1970 Today's Date: 04/19/2023   History of present illness Pt is a 53 year old woman admitted on 1/17 with acute on chronic respiratory failure in the setting influenza A PNA. Intubated 1/17-1/22 with agitation complicating hospital course. PMH: COPD, tobacco use, OSA on bipap, DM.   OT comments  Pt supervised for ambulation 2 laps in hallway due to lines. Pt managed O2 tank, no LOB. SpO2 91% on 3L at rest, 83% with ambulation, increased to 4L with SpO2 increasing to 89%. Educated pt in energy conservation strategies and pursed lip breathing. Pt is functioning at a set up to supervision level in ADLs. No further OT needs. Pt is eager to discharge home later today.       If plan is discharge home, recommend the following:  Assistance with cooking/housework;Assist for transportation;Help with stairs or ramp for entrance   Equipment Recommendations  None recommended by OT    Recommendations for Other Services      Precautions / Restrictions Precautions Precautions: Other (comment) Precaution Comments: Monitor O2 Restrictions Weight Bearing Restrictions Per Provider Order: No       Mobility Bed Mobility                    Transfers Overall transfer level: Independent Equipment used: None Transfers: Sit to/from Stand Sit to Stand: Supervision           General transfer comment: Supervision for lines; stood without difficulty     Balance     Sitting balance-Leahy Scale: Normal       Standing balance-Leahy Scale: Good Standing balance comment: managed O2 tank during walk                           ADL either performed or assessed with clinical judgement   ADL Overall ADL's : Needs assistance/impaired     Grooming: Supervision/safety;Standing                   Toilet Transfer: Supervision/safety;Ambulation;Comfort height  toilet;Grab bars;Rolling walker (2 wheels)   Toileting- Clothing Manipulation and Hygiene: Supervision/safety;Sit to/from stand       Functional mobility during ADLs: Supervision/safety (for lines) General ADL Comments: Educated in energy conservation strategies and pursed lip breathing. Supervision for lines during ADLs.    Extremity/Trunk Assessment              Vision       Perception     Praxis      Cognition Arousal: Alert Behavior During Therapy: WFL for tasks assessed/performed Overall Cognitive Status: Within Functional Limits for tasks assessed                                          Exercises      Shoulder Instructions       General Comments      Pertinent Vitals/ Pain       Pain Assessment Pain Assessment: No/denies pain  Home Living                                          Prior Functioning/Environment  Frequency  Min 1X/week        Progress Toward Goals  OT Goals(current goals can now be found in the care plan section)  Progress towards OT goals: Progressing toward goals  Acute Rehab OT Goals OT Goal Formulation: With patient  Plan      Co-evaluation                 AM-PAC OT "6 Clicks" Daily Activity     Outcome Measure   Help from another person eating meals?: None Help from another person taking care of personal grooming?: None Help from another person toileting, which includes using toliet, bedpan, or urinal?: None Help from another person bathing (including washing, rinsing, drying)?: None Help from another person to put on and taking off regular upper body clothing?: None Help from another person to put on and taking off regular lower body clothing?: None 6 Click Score: 24    End of Session Equipment Utilized During Treatment: Oxygen (3-4)  OT Visit Diagnosis: Other (comment) (decreased activity tolerance)   Activity Tolerance Patient tolerated treatment  well   Patient Left in chair;with call bell/phone within reach   Nurse Communication Other (comment) (O2 sats)        Time: 4098-1191 OT Time Calculation (min): 14 min  Charges: OT General Charges $OT Visit: 1 Visit OT Treatments $Self Care/Home Management : 8-22 mins  Michele [name], OTR/L Acute Rehabilitation Services Office: [phone]   Michele [name] 04/19/2023, 10:07 AM

## 2023-04-19 NOTE — TOC Transition Note (Signed)
Transition of Care [facility]) - Discharge Note   Patient Details  Name: Michele [name] MRN: [id number] Date of Birth: [birth year]  Transition of Care [facility]) CM/SW Contact:  Tom-Johnson, Hershal Coria, RN Phone Number: 04/19/2023, 2:56 PM   Clinical Narrative:     Patient is scheduled for discharge today.  Readmission Risk Assessment done. New patient establishment, hospital f/u and discharge instructions on AVS. Prescriptions sent to [facility] pharmacy and patient will receive meds prior discharge. Home O2 and Nebulizer machine ordered from [place] and Zollie Beckers to deliver portable tank and Neb machine to patient at bedside.  Significant other, Mark to transport at discharge.  No further TOC needs noted.          Final next level of care: Home/Self Care Barriers to Discharge: Barriers Resolved   Patient Goals and CMS Choice Patient states their goals for this hospitalization and ongoing recovery are:: To return home CMS Medicare.gov Compare Post Acute Care list provided to:: Patient Choice offered to / list presented to : Patient      Discharge Placement                Patient to be transferred to facility by: Significant other Name of family member notified: [facility]    Discharge Plan and Services Additional resources added to the After Visit Summary for                  DME Arranged: Nebulizer machine, Oxygen DME Agency: [facility] Date DME Agency Contacted: 04/19/23 Time DME Agency Contacted: [id number] Representative spoke with at DME Agency: Zollie Beckers HH Arranged: NA HH Agency: NA        Social Drivers of Health (SDOH) Interventions SDOH Screenings   Food Insecurity: Patient Unable To Answer (04/06/2023)  Housing: Patient Unable To Answer (04/06/2023)  Transportation Needs: Patient Unable To Answer (04/06/2023)  Utilities: Patient Unable To Answer (04/06/2023)  Tobacco Use: High Risk (04/06/2023)     Readmission Risk Interventions    04/17/2023    1:48 PM  03/09/2021   12:15 PM  Readmission Risk Prevention Plan  Post Dischage Appt Complete Complete  Medication Screening Complete Complete  Transportation Screening Complete Complete

## 2023-04-27 ENCOUNTER — Ambulatory Visit (INDEPENDENT_AMBULATORY_CARE_PROVIDER_SITE_OTHER): Payer: 59 | Admitting: Nurse Practitioner

## 2023-04-27 ENCOUNTER — Other Ambulatory Visit (HOSPITAL_COMMUNITY): Payer: Self-pay

## 2023-04-27 ENCOUNTER — Encounter: Payer: Self-pay | Admitting: Nurse Practitioner

## 2023-04-27 ENCOUNTER — Encounter (HOSPITAL_COMMUNITY): Payer: Self-pay

## 2023-04-27 ENCOUNTER — Other Ambulatory Visit: Payer: Self-pay

## 2023-04-27 VITALS — BP 120/72 | HR 88 | Ht 62.5 in | Wt 202.0 lb

## 2023-04-27 DIAGNOSIS — F419 Anxiety disorder, unspecified: Secondary | ICD-10-CM

## 2023-04-27 DIAGNOSIS — J9601 Acute respiratory failure with hypoxia: Secondary | ICD-10-CM | POA: Diagnosis not present

## 2023-04-27 DIAGNOSIS — Z09 Encounter for follow-up examination after completed treatment for conditions other than malignant neoplasm: Secondary | ICD-10-CM | POA: Insufficient documentation

## 2023-04-27 DIAGNOSIS — J9602 Acute respiratory failure with hypercapnia: Secondary | ICD-10-CM

## 2023-04-27 DIAGNOSIS — E119 Type 2 diabetes mellitus without complications: Secondary | ICD-10-CM

## 2023-04-27 DIAGNOSIS — Z1211 Encounter for screening for malignant neoplasm of colon: Secondary | ICD-10-CM | POA: Diagnosis not present

## 2023-04-27 DIAGNOSIS — Z720 Tobacco use: Secondary | ICD-10-CM

## 2023-04-27 DIAGNOSIS — H538 Other visual disturbances: Secondary | ICD-10-CM

## 2023-04-27 DIAGNOSIS — J441 Chronic obstructive pulmonary disease with (acute) exacerbation: Secondary | ICD-10-CM

## 2023-04-27 DIAGNOSIS — G4733 Obstructive sleep apnea (adult) (pediatric): Secondary | ICD-10-CM

## 2023-04-27 DIAGNOSIS — Z1231 Encounter for screening mammogram for malignant neoplasm of breast: Secondary | ICD-10-CM

## 2023-04-27 HISTORY — DX: Acute respiratory failure with hypoxia: J96.01

## 2023-04-27 MED ORDER — SERTRALINE HCL 25 MG PO TABS
25.0000 mg | ORAL_TABLET | Freq: Every day | ORAL | 1 refills | Status: DC
  Filled 2023-04-27 – 2023-05-14 (×2): qty 60, 60d supply, fill #0

## 2023-04-27 MED ORDER — ALBUTEROL SULFATE HFA 108 (90 BASE) MCG/ACT IN AERS
2.0000 | INHALATION_SPRAY | RESPIRATORY_TRACT | 1 refills | Status: AC | PRN
  Filled 2023-04-27: qty 18, 17d supply, fill #0

## 2023-04-27 MED ORDER — MOMETASONE FURO-FORMOTEROL FUM 200-5 MCG/ACT IN AERO
2.0000 | INHALATION_SPRAY | Freq: Two times a day (BID) | RESPIRATORY_TRACT | 3 refills | Status: DC
  Filled 2023-04-27 – 2023-05-14 (×2): qty 13, 30d supply, fill #0
  Filled 2023-06-13: qty 13, 30d supply, fill #1
  Filled 2023-08-29: qty 13, 30d supply, fill #2
  Filled 2023-09-28: qty 13, 30d supply, fill #3

## 2023-04-27 MED ORDER — HYDROXYZINE HCL 25 MG PO TABS
25.0000 mg | ORAL_TABLET | Freq: Three times a day (TID) | ORAL | 1 refills | Status: DC | PRN
  Filled 2023-04-27 (×2): qty 30, 10d supply, fill #0
  Filled 2023-05-14: qty 30, 10d supply, fill #1

## 2023-04-27 MED ORDER — IPRATROPIUM-ALBUTEROL 0.5-2.5 (3) MG/3ML IN SOLN
3.0000 mL | RESPIRATORY_TRACT | 1 refills | Status: AC | PRN
  Filled 2023-04-27: qty 360, 20d supply, fill #0

## 2023-04-27 MED ORDER — BENZONATATE 100 MG PO CAPS
100.0000 mg | ORAL_CAPSULE | Freq: Three times a day (TID) | ORAL | 0 refills | Status: DC
  Filled 2023-04-27 (×2): qty 20, 7d supply, fill #0

## 2023-04-27 NOTE — Assessment & Plan Note (Addendum)
 Lab Results  Component Value Date   HGBA1C 6.2 (H) 04/07/2023  Currently diet controlled Checking urine microalbumin labs Patient counseled on low-carb diet

## 2023-04-27 NOTE — Assessment & Plan Note (Signed)
 Continue sertraline  25 mg daily She denies SI, HI

## 2023-04-27 NOTE — Patient Instructions (Signed)
 1. COPD exacerbation (HCC) (Primary)  - mometasone -formoterol  (DULERA ) 200-5 MCG/ACT AERO; Inhale 2 puffs into the lungs 2 (two) times daily.  Dispense: 13 g; Refill: 3 - benzonatate  (TESSALON ) 100 MG capsule; Take 1 capsule (100 mg total) by mouth 3 (three) times daily.  Dispense: 20 capsule; Refill: 0 - albuterol  (VENTOLIN  HFA) 108 (90 Base) MCG/ACT inhaler; Inhale 2 puffs into the lungs every 4 (four) hours as needed for wheezing or shortness of breath.  Dispense: 18 g; Refill: 1 - Ambulatory referral to Pulmonology  2. Anxious mood  - sertraline  (ZOLOFT ) 25 MG tablet; Take 1 tablet (25 mg total) by mouth daily.  Dispense: 60 tablet; Refill: 1 - hydrOXYzine  (ATARAX ) 25 MG tablet; Take 1 tablet (25 mg total) by mouth 3 (three) times daily as needed for anxiety.  Dispense: 30 tablet; Refill: 1  3. Screening for colon cancer  - Cologuard  4. Blurry vision  - Ambulatory referral to Ophthalmology  5. Diabetes mellitus without complication (HCC)  - Ambulatory referral to Ophthalmology - Microalbumin / creatinine urine ratio  6. Screening mammogram for breast cancer  - MM Digital Screening; Please call [phone]   to schedule your mammogram.  [facility]. [address] [place], [place] [address]. [place] .    7. OSA treated with BiPAP  - Home sleep test; Future    It is important that you exercise regularly at least 30 minutes 5 times a week as tolerated  Think about what you will eat, plan ahead. Choose  clean, green, fresh or frozen over canned, processed or packaged foods which are more sugary, salty and fatty. 70 to 75% of food eaten should be vegetables and fruit. Three meals at set times with snacks allowed between meals, but they must be fruit or vegetables. Aim to eat over a 12 hour period , example 7 am to 7 pm, and STOP after  your last meal of the day. Drink water ,generally about 64 ounces per day, no other drink is as  healthy. Fruit juice is best enjoyed in a healthy way, by EATING the fruit.  Thanks for choosing [facility] we consider it a privelige to serve you.

## 2023-04-27 NOTE — Assessment & Plan Note (Signed)
 She has quit smoking Takes nicotine  patch as needed Patient encouraged to continue to abstain from smoking cigarettes

## 2023-04-27 NOTE — Assessment & Plan Note (Signed)
 Hospital chart reviewed, including discharge summary Medications reconciled and reviewed with the patient in detail

## 2023-04-27 NOTE — Assessment & Plan Note (Signed)
 O2 saturations below  Resting on room air-96% Walking on air 85% Oxygen saturation increased to 94% when placed back on supplemental oxygen Patient encouraged to continue oxygen at 3 to 4 L as ordered Follow-up with pulmonologist Continue Dulera  2 puffs twice daily, DuoNeb 3 mL every 4 hours as needed, albuterol  inhaler 2 puffs every 4 hours as needed, prednisone  as ordered

## 2023-04-27 NOTE — Assessment & Plan Note (Addendum)
 Medications refilled Patient referred to pulmonology Encouraged to complete full course of steroids ordered  - mometasone -formoterol  (DULERA ) 200-5 MCG/ACT AERO; Inhale 2 puffs into the lungs 2 (two) times daily.  Dispense: 13 g; Refill: 3 - benzonatate  (TESSALON ) 100 MG capsule; Take 1 capsule (100 mg total) by mouth 3 (three) times daily.  Dispense: 20 capsule; Refill: 0 - albuterol  (VENTOLIN  HFA) 108 (90 Base) MCG/ACT inhaler; Inhale 2 puffs into the lungs every 4 (four) hours as needed for wheezing or shortness of breath.  Dispense: 18 g; Refill: 1 - Ambulatory referral to Pulmonology - ipratropium-albuterol  (DUONEB) 0.5-2.5 (3) MG/3ML SOLN; Take 3 mLs by nebulization every 4 (four) hours as needed.  Dispense: 360 mL; Refill: 1

## 2023-04-27 NOTE — Assessment & Plan Note (Signed)
 Home sleep study ordered

## 2023-04-27 NOTE — Progress Notes (Signed)
 New Patient Office Visit  Subjective:  Patient ID: Michele [name], female    DOB: [birth year]  Age: 53 y.o. MRN: [id number]  CC:  Chief Complaint  Patient presents with   Hospitalization Follow-up    HPI Michele [name] is a 53 y.o. female with past medical history of COPD who presents to establish care for her chronic medical conditions and for hospitalization follow-up  Patient was on admission at the hospital from 04/06/2023 to 04/19/2023 for COPD exacerbation, acute on chronic hypoxic hypercapnic respiratory failure secondary to flu a pneumonia.  Patient was discharged home on 3 to 4 L oxygen, there was a recommendation for her to follow-up with pulmonology.  She is currently on Dulera  inhaler, albuterol  as needed, taper of steroid, DuoNeb nebulizer as needed.  She has quit smoking since her recent hospitalization, stated that she started smoking in high school and has smoked less than a pack of cigarettes daily.  Patient states that her shortness of breath has improved, cough has also improved.  She denies wheezing.  Has been taking her medications as ordered.  She is looking at getting off oxygen soon      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   COPD (chronic obstructive pulmonary disease) (HCC)    Fibroid 04/07/2015   Medical history non-contributory    Tobacco use     Past Surgical History:  Procedure Laterality Date   BILATERAL SALPINGECTOMY  04/07/2015   Procedure: BILATERAL SALPINGECTOMY with left oophorectomy, left ovarian cystectomy and removal of pelvic mass;  Surgeon: Aida DELENA Na, MD;  Location: [facility];  Service: Gynecology;;   CESAREAN SECTION     WISDOM TOOTH EXTRACTION      Family History  Problem Relation Age of Onset   Cancer Mother    Cervical cancer Mother    Cancer Maternal Aunt    Cancer Maternal Uncle     Social History   Socioeconomic  History   Marital status: Divorced    Spouse name: Not on file   Number of children: 2   Years of education: Not on file   Highest education level: High school graduate  Occupational History   Not on file  Tobacco Use   Smoking status: Former    Current packs/day: 0.50    Types: Cigarettes   Smokeless tobacco: Never  Vaping Use   Vaping status: Never Used  Substance and Sexual Activity   Alcohol use: Yes    Comment: occassional   Drug use: No   Sexual activity: Yes    Birth control/protection: Post-menopausal  Other Topics Concern   Not on file  Social History Narrative   Lives with her boyfriend    Social Drivers of Health   Financial Resource Strain: Not on file  Food Insecurity: Patient Unable To Answer (04/06/2023)   Hunger Vital Sign    Worried About Running Out of Food in the Last Year: Patient unable to answer    Ran Out of Food in the Last Year: Patient unable to answer  Transportation Needs: Patient Unable To Answer (04/06/2023)   PRAPARE - Transportation    Lack of Transportation (Medical): Patient unable to answer    Lack of Transportation (Non-Medical): Patient unable to answer  Physical Activity: Not on file  Stress: Not on file  Social Connections: Not on file  Intimate Partner Violence: Patient Unable To Answer (04/06/2023)   Humiliation,  Afraid, Rape, and Kick questionnaire    Fear of Current or Ex-Partner: Patient unable to answer    Emotionally Abused: Patient unable to answer    Physically Abused: Patient unable to answer    Sexually Abused: Patient unable to answer    ROS Review of Systems  Constitutional:  Negative for appetite change, chills, fatigue and fever.  HENT:  Negative for congestion, postnasal drip, rhinorrhea and sneezing.   Respiratory:  Positive for cough and shortness of breath. Negative for wheezing.   Cardiovascular:  Negative for chest pain, palpitations and leg swelling.  Gastrointestinal:  Negative for abdominal pain,  constipation, nausea and vomiting.  Genitourinary:  Negative for difficulty urinating, dysuria, flank pain and frequency.  Musculoskeletal:  Negative for arthralgias, back pain, joint swelling and myalgias.  Skin:  Negative for color change, pallor, rash and wound.  Neurological:  Negative for dizziness, facial asymmetry, weakness, numbness and headaches.  Psychiatric/Behavioral:  Negative for behavioral problems, confusion, self-injury and suicidal ideas.     Objective:   Today's Vitals: BP 120/72   Pulse 88   Ht 5' 2.5 (1.588 m)   Wt 202 lb (91.6 kg)   LMP 03/20/2009 (Within Years)   SpO2 97%   BMI 36.36 kg/m   Physical Exam Vitals and nursing note reviewed.  Constitutional:      General: She is not in acute distress.    Appearance: Normal appearance. She is obese. She is not ill-appearing, toxic-appearing or diaphoretic.  HENT:     Mouth/Throat:     Mouth: Mucous membranes are moist.     Pharynx: Oropharynx is clear. No oropharyngeal exudate or posterior oropharyngeal erythema.  Eyes:     General: No scleral icterus.       Right eye: No discharge.        Left eye: No discharge.     Extraocular Movements: Extraocular movements intact.     Conjunctiva/sclera: Conjunctivae normal.  Cardiovascular:     Rate and Rhythm: Normal rate and regular rhythm.     Pulses: Normal pulses.     Heart sounds: Normal heart sounds. No murmur heard.    No friction rub. No gallop.  Pulmonary:     Effort: Pulmonary effort is normal. No respiratory distress.     Breath sounds: Normal breath sounds. No stridor. No wheezing, rhonchi or rales.  Chest:     Chest wall: No tenderness.  Abdominal:     General: There is no distension.     Palpations: Abdomen is soft.     Tenderness: There is no abdominal tenderness. There is no right CVA tenderness, left CVA tenderness or guarding.  Musculoskeletal:        General: No swelling, tenderness, deformity or signs of injury.     Right lower leg: No  edema.     Left lower leg: No edema.  Skin:    General: Skin is warm and dry.     Capillary Refill: Capillary refill takes less than 2 seconds.     Coloration: Skin is not jaundiced or pale.     Findings: No bruising, erythema or lesion.  Neurological:     Mental Status: She is alert and oriented to person, place, and time.     Motor: No weakness.     Coordination: Coordination normal.     Gait: Gait normal.  Psychiatric:        Mood and Affect: Mood normal.        Behavior: Behavior normal.  Thought Content: Thought content normal.        Judgment: Judgment normal.     Assessment & Plan:   Problem List Items Addressed This Visit       Respiratory   OSA treated with BiPAP   Home sleep study ordered      Relevant Orders   Home sleep test   COPD exacerbation (HCC)   Medications refilled Patient referred to pulmonology Encouraged to complete full course of steroids ordered  - mometasone -formoterol  (DULERA ) 200-5 MCG/ACT AERO; Inhale 2 puffs into the lungs 2 (two) times daily.  Dispense: 13 g; Refill: 3 - benzonatate  (TESSALON ) 100 MG capsule; Take 1 capsule (100 mg total) by mouth 3 (three) times daily.  Dispense: 20 capsule; Refill: 0 - albuterol  (VENTOLIN  HFA) 108 (90 Base) MCG/ACT inhaler; Inhale 2 puffs into the lungs every 4 (four) hours as needed for wheezing or shortness of breath.  Dispense: 18 g; Refill: 1 - Ambulatory referral to Pulmonology - ipratropium-albuterol  (DUONEB) 0.5-2.5 (3) MG/3ML SOLN; Take 3 mLs by nebulization every 4 (four) hours as needed.  Dispense: 360 mL; Refill: 1       Relevant Medications   mometasone -formoterol  (DULERA ) 200-5 MCG/ACT AERO   benzonatate  (TESSALON ) 100 MG capsule   albuterol  (VENTOLIN  HFA) 108 (90 Base) MCG/ACT inhaler   ipratropium-albuterol  (DUONEB) 0.5-2.5 (3) MG/3ML SOLN   Other Relevant Orders   Ambulatory referral to Pulmonology   Acute respiratory failure with hypoxia and hypercapnia (HCC) - Primary   O2  saturations below  Resting on room air-96% Walking on air 85% Oxygen saturation increased to 94% when placed back on supplemental oxygen Patient encouraged to continue oxygen at 3 to 4 L as ordered Follow-up with pulmonologist Continue Dulera  2 puffs twice daily, DuoNeb 3 mL every 4 hours as needed, albuterol  inhaler 2 puffs every 4 hours as needed, prednisone  as ordered        Relevant Medications   mometasone -formoterol  (DULERA ) 200-5 MCG/ACT AERO   benzonatate  (TESSALON ) 100 MG capsule   albuterol  (VENTOLIN  HFA) 108 (90 Base) MCG/ACT inhaler   ipratropium-albuterol  (DUONEB) 0.5-2.5 (3) MG/3ML SOLN   Other Relevant Orders   Ambulatory referral to Pulmonology     Endocrine   Diabetes mellitus without complication (HCC)   Lab Results  Component Value Date   HGBA1C 6.2 (H) 04/07/2023  Currently diet controlled Checking urine microalbumin labs Patient counseled on low-carb diet      Relevant Orders   Ambulatory referral to Ophthalmology   Microalbumin / creatinine urine ratio     Other   Tobacco use   She has quit smoking Takes nicotine  patch as needed Patient encouraged to continue to abstain from smoking cigarettes      Anxious mood   Continue sertraline  25 mg daily She denies SI, HI      Relevant Medications   sertraline  (ZOLOFT ) 25 MG tablet   hydrOXYzine  (ATARAX ) 25 MG tablet   Hospital discharge follow-up   Hospital chart reviewed, including discharge summary Medications reconciled and reviewed with the patient in detail       Other Visit Diagnoses       Screening for colon cancer       Relevant Orders   Cologuard     Blurry vision       Relevant Orders   Ambulatory referral to Ophthalmology     Screening mammogram for breast cancer       Relevant Orders   MM 3D SCREENING MAMMOGRAM BILATERAL BREAST  Outpatient Encounter Medications as of 04/27/2023  Medication Sig   acetaminophen  (TYLENOL ) 500 MG tablet Take 500 mg by mouth every 6 (six)  hours as needed for mild pain.   predniSONE  (DELTASONE ) 10 MG tablet Take 4 tablets (40 mg total) by mouth daily for 3 days, THEN 3 tablets (30 mg total) daily for 3 days, THEN 2 tablets (20 mg total) daily for 3 days, THEN 1 tablet (10 mg total) daily for 3 days.   [DISCONTINUED] albuterol  (VENTOLIN  HFA) 108 (90 Base) MCG/ACT inhaler Inhale 2 puffs into the lungs every 4 (four) hours as needed for wheezing or shortness of breath.   [DISCONTINUED] hydrOXYzine  (ATARAX ) 25 MG tablet Take 1 tablet (25 mg total) by mouth 3 (three) times daily as needed for anxiety.   [DISCONTINUED] ipratropium-albuterol  (DUONEB) 0.5-2.5 (3) MG/3ML SOLN Take 3 mLs by nebulization every 4 (four) hours as needed.   [DISCONTINUED] mometasone -formoterol  (DULERA ) 200-5 MCG/ACT AERO Inhale 2 puffs into the lungs 2 (two) times daily.   [DISCONTINUED] sertraline  (ZOLOFT ) 25 MG tablet Take 1 tablet (25 mg total) by mouth daily.   albuterol  (VENTOLIN  HFA) 108 (90 Base) MCG/ACT inhaler Inhale 2 puffs into the lungs every 4 (four) hours as needed for wheezing or shortness of breath.   benzonatate  (TESSALON ) 100 MG capsule Take 1 capsule (100 mg total) by mouth 3 (three) times daily.   hydrOXYzine  (ATARAX ) 25 MG tablet Take 1 tablet (25 mg total) by mouth 3 (three) times daily as needed for anxiety.   ipratropium-albuterol  (DUONEB) 0.5-2.5 (3) MG/3ML SOLN Take 3 mLs by nebulization every 4 (four) hours as needed.   mometasone -formoterol  (DULERA ) 200-5 MCG/ACT AERO Inhale 2 puffs into the lungs 2 (two) times daily.   nicotine  (NICODERM CQ  - DOSED IN MG/24 HR) 7 mg/24hr patch Place 1 patch (7 mg total) onto the skin daily.   sertraline  (ZOLOFT ) 25 MG tablet Take 1 tablet (25 mg total) by mouth daily.   [DISCONTINUED] benzonatate  (TESSALON ) 100 MG capsule Take 1 capsule (100 mg total) by mouth 3 (three) times daily. (Patient not taking: Reported on 04/27/2023)   No facility-administered encounter medications on file as of 04/27/2023.     Follow-up: Return in about 6 weeks (around 06/08/2023) for ANXIETY, CPE.   [place] Subramaniam R [place] Deleo, FNP

## 2023-04-28 LAB — MICROALBUMIN / CREATININE URINE RATIO
Creatinine, Urine: 19.8 mg/dL
Microalb/Creat Ratio: <15 mg/g{creat} (ref 0–29)
Microalbumin, Urine: <3 ug/mL

## 2023-04-30 ENCOUNTER — Encounter: Payer: Self-pay | Admitting: Pulmonary Disease

## 2023-04-30 ENCOUNTER — Ambulatory Visit: Payer: 59 | Admitting: Pulmonary Disease

## 2023-04-30 VITALS — BP 126/78 | HR 93 | Ht 62.5 in | Wt 209.8 lb

## 2023-04-30 DIAGNOSIS — J431 Panlobular emphysema: Secondary | ICD-10-CM

## 2023-04-30 DIAGNOSIS — G4710 Hypersomnia, unspecified: Secondary | ICD-10-CM | POA: Diagnosis not present

## 2023-04-30 DIAGNOSIS — J449 Chronic obstructive pulmonary disease, unspecified: Secondary | ICD-10-CM

## 2023-04-30 DIAGNOSIS — R0681 Apnea, not elsewhere classified: Secondary | ICD-10-CM

## 2023-04-30 DIAGNOSIS — J9611 Chronic respiratory failure with hypoxia: Secondary | ICD-10-CM

## 2023-04-30 NOTE — Progress Notes (Signed)
 Michele [name]    [id number]    1970-09-28  Primary Care Physician:Paseda, Folashade R, FNP  Referring Physician: Paseda, Folashade R, FNP [phone] [address] [place],  [place] [address]  Chief complaint:   Follow-up for recent hospitalization  HPI:  Chronic respiratory failure, recently quit smoking  Shortness of breath, congestion, limited activities of daily living with respect to shortness of breath with activity  Does have some hip pain  History of snoring, no witnessed apneas, wakes up gasping sometimes Sleep is nonrestorative Wakes up feeling tired Admits to dryness of the mouth in the morning, headaches sometimes Some confusion since recent hospitalization  Has been compliant with inhalers  Smoked up to 2 packs a day  No previous pulmonary function test  Did have asthma as a child but she felt she outgrew it   Outpatient Encounter Medications as of 04/30/2023  Medication Sig   acetaminophen  (TYLENOL ) 500 MG tablet Take 500 mg by mouth every 6 (six) hours as needed for mild pain.   albuterol  (VENTOLIN  HFA) 108 (90 Base) MCG/ACT inhaler Inhale 2 puffs into the lungs every 4 (four) hours as needed for wheezing or shortness of breath.   hydrOXYzine  (ATARAX ) 25 MG tablet Take 1 tablet (25 mg total) by mouth 3 (three) times daily as needed for anxiety.   ipratropium-albuterol  (DUONEB) 0.5-2.5 (3) MG/3ML SOLN Take 3 mLs by nebulization every 4 (four) hours as needed.   mometasone -formoterol  (DULERA ) 200-5 MCG/ACT AERO Inhale 2 puffs into the lungs 2 (two) times daily.   nicotine  (NICODERM CQ  - DOSED IN MG/24 HR) 7 mg/24hr patch Place 1 patch (7 mg total) onto the skin daily.   predniSONE  (DELTASONE ) 10 MG tablet Take 4 tablets (40 mg total) by mouth daily for 3 days, THEN 3 tablets (30 mg total) daily for 3 days, THEN 2 tablets (20 mg total) daily for 3 days, THEN 1 tablet (10 mg total) daily for 3 days.   sertraline  (ZOLOFT ) 25 MG tablet Take 1 tablet  (25 mg total) by mouth daily.   benzonatate  (TESSALON ) 100 MG capsule Take 1 capsule (100 mg total) by mouth 3 (three) times daily. (Patient not taking: Reported on 04/30/2023)   No facility-administered encounter medications on file as of 04/30/2023.    Allergies as of 04/30/2023   (No Known Allergies)    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   COPD (chronic obstructive pulmonary disease) (HCC)    Fibroid 04/07/2015   Medical history non-contributory    Tobacco use     Past Surgical History:  Procedure Laterality Date   BILATERAL SALPINGECTOMY  04/07/2015   Procedure: BILATERAL SALPINGECTOMY with left oophorectomy, left ovarian cystectomy and removal of pelvic mass;  Surgeon: Heide Livings, MD;  Location: [facility];  Service: Gynecology;;   CESAREAN SECTION     WISDOM TOOTH EXTRACTION      Family History  Problem Relation Age of Onset   Cancer Mother    Cervical cancer Mother    Cancer Maternal Aunt    Cancer Maternal Uncle     Social History   Socioeconomic History   Marital status: Divorced    Spouse name: Not on file   Number of children: 2   Years of education: Not on file   Highest education level: High school graduate  Occupational History   Not on file  Tobacco Use   Smoking status: Former    Current packs/day: 0.50    Types: Cigarettes   Smokeless tobacco: Never  Vaping Use   Vaping status: Never Used  Substance and Sexual Activity   Alcohol use: Yes    Comment: occassional   Drug use: No   Sexual activity: Yes    Birth control/protection: Post-menopausal  Other Topics Concern   Not on file  Social History Narrative   Lives with her boyfriend    Social Drivers of Health   Financial Resource Strain: Not on file  Food Insecurity: Patient Unable To Answer (04/06/2023)   Hunger Vital Sign    Worried About Running Out of Food in the Last Year:  Patient unable to answer    Ran Out of Food in the Last Year: Patient unable to answer  Transportation Needs: Patient Unable To Answer (04/06/2023)   PRAPARE - Transportation    Lack of Transportation (Medical): Patient unable to answer    Lack of Transportation (Non-Medical): Patient unable to answer  Physical Activity: Not on file  Stress: Not on file  Social Connections: Not on file  Intimate Partner Violence: Patient Unable To Answer (04/06/2023)   Humiliation, Afraid, Rape, and Kick questionnaire    Fear of Current or Ex-Partner: Patient unable to answer    Emotionally Abused: Patient unable to answer    Physically Abused: Patient unable to answer    Sexually Abused: Patient unable to answer    Review of Systems  Respiratory:  Positive for cough and shortness of breath.   Psychiatric/Behavioral:  Positive for sleep disturbance.     There were no vitals filed for this visit.   Physical Exam Constitutional:      Appearance: She is obese.  HENT:     Head: Normocephalic.     Mouth/Throat:     Mouth: Mucous membranes are moist.  Eyes:     General: No scleral icterus. Cardiovascular:     Rate and Rhythm: Normal rate and regular rhythm.     Heart sounds: No murmur heard.    No friction rub.  Pulmonary:     Effort: No respiratory distress.     Breath sounds: No stridor. No wheezing or rhonchi.  Musculoskeletal:     Cervical back: No rigidity or tenderness.  Neurological:     Mental Status: She is alert.  Psychiatric:        Mood and Affect: Mood normal.   Epworth of 11  Data Reviewed: Previous CT scan of the chest shows evidence of emphysema  Recent hospital records reviewed  Previous echocardiogram within normal limits  Assessment:  Obstructive lung disease  Chronic respiratory failure  Anxiety  Nonrestrictive sleep  Moderate to high probability of significant obstructive sleep apnea, patient is on oxygen supplementation, will not be able to do a home  sleep test  Plan/Recommendations: Schedule patient for an in lab split-night study  Continue oxygen supplementation, encouraged to procure a pulse oximeter to adjust oxygen supplementation needs  Graded activities as tolerated  Weight loss efforts encouraged  Tentative follow-up in about 6 to 8 weeks  Schedule patient for PFT to be done at the day of next visit   Myer Artis MD [facility] 04/30/2023, 1:12 PM  CC: Paseda, Folashade R, FNP

## 2023-04-30 NOTE — Patient Instructions (Addendum)
 Schedule for in-lab split-night study  Schedule for pulmonary function test  Follow-up in about 6 to 8 weeks  Continue current inhalers  Exercise as tolerated  Adjust your oxygen supplementation based on oximetry

## 2023-05-11 ENCOUNTER — Telehealth: Payer: Self-pay | Admitting: Pulmonary Disease

## 2023-05-11 NOTE — Telephone Encounter (Signed)
 Called the pt and there was no answer and her voicemail not set up yet  Will try back

## 2023-05-11 NOTE — Telephone Encounter (Signed)
 Also, she said she needs a letter for social services.

## 2023-05-11 NOTE — Telephone Encounter (Signed)
 PT states she needs a letter for her work that she is unable to work and is on Oxygen  24/7. Her # is [phone]

## 2023-05-14 ENCOUNTER — Other Ambulatory Visit (HOSPITAL_COMMUNITY): Payer: Self-pay

## 2023-05-14 ENCOUNTER — Other Ambulatory Visit: Payer: Self-pay

## 2023-05-14 MED ORDER — ACETAMINOPHEN 500 MG PO TABS
500.0000 mg | ORAL_TABLET | Freq: Four times a day (QID) | ORAL | 1 refills | Status: DC | PRN
  Filled 2023-05-14: qty 30, 8d supply, fill #0
  Filled 2023-06-08: qty 30, 8d supply, fill #1

## 2023-05-15 ENCOUNTER — Other Ambulatory Visit: Payer: Self-pay

## 2023-05-15 LAB — COLOGUARD: COLOGUARD: NEGATIVE

## 2023-05-15 NOTE — Telephone Encounter (Signed)
 Called patient.  Patient states her employer sent her an email with forms and information that needs to be filled out.  Patient will have the employer fax these to Dr. Wynona Neat .  Gave fax number to patient.  Patient will also contact social services to determine what information is needed.  Informed patient once we get these requests, we can process the information and send out any forms or letters per the provider.

## 2023-05-29 ENCOUNTER — Telehealth: Payer: Self-pay | Admitting: Nurse Practitioner

## 2023-05-29 NOTE — Telephone Encounter (Signed)
 Copied from CRM [id number]. Topic: Clinical - Medical Advice >> May 29, 2023  2:01 PM Marland Kitchen D wrote: Patient need a letter from Dr. Geoffery Spruce stating why she isn't able to work. Her job is wanting to terminate her and she is unable to get food stamps. She states she's on oxygen 24/7 with a big tank that she has to pull along with her. She states her anxiety medicine isn't working. Patient needs to know what she can do. Call back at

## 2023-05-31 ENCOUNTER — Telehealth: Payer: Self-pay

## 2023-05-31 NOTE — Telephone Encounter (Signed)
 Copied from CRM [id number]. Topic: General - Other >> May 31, 2023  9:34 AM Macon Large wrote: Reason for CRM: Patient stated that she really needs to get the letter stating that she is out of work due to her medical condition so that she will be able to receive food stamps. Patient stated that the letter has to be received by 06/02/23 for her to be eligible this month. Patient stated that she had to go to the food bank and if the letter is not received by 06/02/23 she will have to wait another month. Patient requests call back at [phone]

## 2023-05-31 NOTE — Telephone Encounter (Signed)
 nfn

## 2023-06-01 ENCOUNTER — Telehealth: Payer: Self-pay

## 2023-06-01 NOTE — Telephone Encounter (Signed)
 Copied from CRM [id number]. Topic: General - Other >> May 31, 2023  9:34 AM Macon Large wrote: Reason for CRM: Patient stated that she really needs to get the letter stating that she is out of work due to her medical condition so that she will be able to receive food stamps. Patient stated that the letter has to be received by 06/02/23 for her to be eligible this month. Patient stated that she had to go to the food bank and if the letter is not received by 06/02/23 she will have to wait another month. Patient requests call back at [phone] >> May 31, 2023  4:33 PM DeAngela L wrote: Patient called back about he message left earlier today and wanted to know if the Dr got to it,she is needing a letter for why she is off work and also foodstamps before 3/20 she states

## 2023-06-04 NOTE — Telephone Encounter (Signed)
Done Kh 

## 2023-06-08 ENCOUNTER — Emergency Department (HOSPITAL_COMMUNITY)
Admission: EM | Admit: 2023-06-08 | Discharge: 2023-06-08 | Disposition: A | Discharge status: Home / Self Care | Attending: Emergency Medicine | Admitting: Emergency Medicine

## 2023-06-08 ENCOUNTER — Other Ambulatory Visit (HOSPITAL_COMMUNITY): Payer: Self-pay

## 2023-06-08 ENCOUNTER — Other Ambulatory Visit: Payer: Self-pay

## 2023-06-08 ENCOUNTER — Telehealth (HOSPITAL_COMMUNITY): Payer: Self-pay | Admitting: Emergency Medicine

## 2023-06-08 ENCOUNTER — Emergency Department (HOSPITAL_COMMUNITY)

## 2023-06-08 ENCOUNTER — Ambulatory Visit: Payer: Self-pay | Admitting: Nurse Practitioner

## 2023-06-08 DIAGNOSIS — J449 Chronic obstructive pulmonary disease, unspecified: Secondary | ICD-10-CM | POA: Diagnosis not present

## 2023-06-08 DIAGNOSIS — D72829 Elevated white blood cell count, unspecified: Secondary | ICD-10-CM | POA: Diagnosis not present

## 2023-06-08 DIAGNOSIS — G510 Bell's palsy: Secondary | ICD-10-CM | POA: Diagnosis not present

## 2023-06-08 DIAGNOSIS — Z87891 Personal history of nicotine dependence: Secondary | ICD-10-CM | POA: Insufficient documentation

## 2023-06-08 DIAGNOSIS — R29810 Facial weakness: Secondary | ICD-10-CM | POA: Diagnosis present

## 2023-06-08 LAB — RAPID URINE DRUG SCREEN, HOSP PERFORMED
Amphetamines: NOT DETECTED
Barbiturates: NOT DETECTED
Benzodiazepines: NOT DETECTED
Cocaine: NOT DETECTED
Opiates: NOT DETECTED
Tetrahydrocannabinol: POSITIVE — AB

## 2023-06-08 LAB — PROTIME-INR
INR: 0.9 (ref 0.8–1.2)
Prothrombin Time: 12.5 s (ref 11.4–15.2)

## 2023-06-08 LAB — COMPREHENSIVE METABOLIC PANEL
ALT: 14 U/L (ref 0–44)
AST: 17 U/L (ref 15–41)
Albumin: 4.4 g/dL (ref 3.5–5.0)
Alkaline Phosphatase: 65 U/L (ref 38–126)
Anion gap: 11 (ref 5–15)
BUN: 10 mg/dL (ref 6–20)
CO2: 27 mmol/L (ref 22–32)
Calcium: 9.3 mg/dL (ref 8.9–10.3)
Chloride: 99 mmol/L (ref 98–111)
Creatinine, Ser: 0.73 mg/dL (ref 0.44–1.00)
GFR, Estimated: >60 mL/min (ref >60)
Glucose, Bld: 97 mg/dL (ref 70–99)
Potassium: 3.6 mmol/L (ref 3.5–5.1)
Sodium: 137 mmol/L (ref 135–145)
Total Bilirubin: 0.7 mg/dL (ref 0.0–1.2)
Total Protein: 8.3 g/dL — ABNORMAL HIGH (ref 6.5–8.1)

## 2023-06-08 LAB — CBC
HCT: 45.8 % (ref 36.0–46.0)
Hemoglobin: 14.8 g/dL (ref 12.0–15.0)
MCH: 30.1 pg (ref 26.0–34.0)
MCHC: 32.3 g/dL (ref 30.0–36.0)
MCV: 93.1 fL (ref 80.0–100.0)
Platelets: 329 10*3/uL (ref 150–400)
RBC: 4.92 MIL/uL (ref 3.87–5.11)
RDW: 14.6 % (ref 11.5–15.5)
WBC: 13.5 10*3/uL — ABNORMAL HIGH (ref 4.0–10.5)
nRBC: 0 % (ref 0.0–0.2)

## 2023-06-08 LAB — DIFFERENTIAL
Abs Immature Granulocytes: 0.2 10*3/uL — ABNORMAL HIGH (ref 0.00–0.07)
Basophils Absolute: 0.1 10*3/uL (ref 0.0–0.1)
Basophils Relative: 1 %
Eosinophils Absolute: 0.3 10*3/uL (ref 0.0–0.5)
Eosinophils Relative: 2 %
Immature Granulocytes: 2 %
Lymphocytes Relative: 27 %
Lymphs Abs: 3.7 10*3/uL (ref 0.7–4.0)
Monocytes Absolute: 0.9 10*3/uL (ref 0.1–1.0)
Monocytes Relative: 6 %
Neutro Abs: 8.4 10*3/uL — ABNORMAL HIGH (ref 1.7–7.7)
Neutrophils Relative %: 62 %

## 2023-06-08 LAB — APTT: aPTT: 34 s (ref 24–36)

## 2023-06-08 LAB — ETHANOL: Alcohol, Ethyl (B): <10 mg/dL (ref <10)

## 2023-06-08 LAB — CBG MONITORING, ED: Glucose-Capillary: 122 mg/dL — ABNORMAL HIGH (ref 70–99)

## 2023-06-08 MED ORDER — VALACYCLOVIR HCL 1 G PO TABS
1000.0000 mg | ORAL_TABLET | Freq: Three times a day (TID) | ORAL | 0 refills | Status: AC
  Filled 2023-06-08: qty 21, 7d supply, fill #0

## 2023-06-08 MED ORDER — PREDNISONE 20 MG PO TABS
60.0000 mg | ORAL_TABLET | Freq: Once | ORAL | Status: AC
  Administered 2023-06-08: 60 mg via ORAL
  Filled 2023-06-08: qty 3

## 2023-06-08 MED ORDER — VALACYCLOVIR HCL 500 MG PO TABS
1000.0000 mg | ORAL_TABLET | Freq: Once | ORAL | Status: AC
  Administered 2023-06-08: 1000 mg via ORAL
  Filled 2023-06-08: qty 2

## 2023-06-08 MED ORDER — PREDNISONE 10 MG PO TABS
60.0000 mg | ORAL_TABLET | Freq: Every day | ORAL | 0 refills | Status: AC
  Filled 2023-06-08: qty 42, 7d supply, fill #0

## 2023-06-08 NOTE — Telephone Encounter (Signed)
 Patient seen in the ED earlier today and diagnosed with Bell's Palsy. Did not receive prescriptions sent to her pharmacy. Will have Valtrex and Prednisone sent.

## 2023-06-08 NOTE — Progress Notes (Signed)
 1014 Code stroke activated.EDP at bedside. LKW 0900. Pt states she noticed left facial droop when getting ready for routine MD appt. No anticoagulants take on a daily basis. MRS 0. 1020 Pt taken to CT via stretcher. Dr. Amada Jupiter paged. 1025 Dr. Amada Jupiter joined telestroke cart for code stroke evaluation. Pt returned to ED from CT. Exam started.  1036 Code stroke session complete. No TNK. Bells Palsy suspected. MRI recommended.

## 2023-06-08 NOTE — ED Provider Notes (Signed)
 [place] EMERGENCY DEPARTMENT AT [facility] Provider Note   CSN: [id number] Arrival date & time: 06/08/23  1007     History  No chief complaint on file.   Michele [name] is a 53 y.o. female.  Patient is a 53 year old female with a history of tobacco use, COPD who is presenting today with a an abrupt history of sudden left-sided facial droop.  Patient reports at 9 AM this morning is when the symptoms started.  She was going to an appointment at her doctors when she noticed her face felt unusual and looked and noticed she had significant drooping in the left side of her face.  She has not had any difficulty speaking, swallowing, no vision changes.  No symptoms in her arm or leg.  She reports yesterday was a normal day she has not had any numbness or tingling on that side.  She denies ever having symptoms like this before.  She did have significant illness with respiratory failure and flu in January and was hospitalized for multiple days but reports since then has been home but she has been under an immense amount of stress.  She currently denies headache or neck pain.  The history is provided by the patient.       Home Medications Prior to Admission medications   Medication Sig Start Date End Date Taking? Authorizing Provider  acetaminophen (TYLENOL) 500 MG tablet Take 1 tablet (500 mg total) by mouth every 6 (six) hours as needed for mild pain (pain score 1-3). 05/14/23   Paseda, Baird Kay, FNP  albuterol (VENTOLIN HFA) 108 (90 Base) MCG/ACT inhaler Inhale 2 puffs into the lungs every 4 (four) hours as needed for wheezing or shortness of breath. 04/27/23   Donell Beers, FNP  hydrOXYzine (ATARAX) 25 MG tablet Take 1 tablet (25 mg total) by mouth 3 (three) times daily as needed for anxiety. 04/27/23   Paseda, Baird Kay, FNP  ipratropium-albuterol (DUONEB) 0.5-2.5 (3) MG/3ML SOLN Take 3 mLs by nebulization every 4 (four) hours as needed. 04/27/23   Paseda, Baird Kay, FNP   mometasone-formoterol (DULERA) 200-5 MCG/ACT AERO Inhale 2 puffs into the lungs 2 (two) times daily. 04/27/23   Paseda, Baird Kay, FNP  nicotine (NICODERM CQ - DOSED IN MG/24 HR) 7 mg/24hr patch Place 1 patch (7 mg total) onto the skin daily. 04/19/23   Briant Cedar, MD  sertraline (ZOLOFT) 25 MG tablet Take 1 tablet (25 mg total) by mouth daily. 04/27/23   Donell Beers, FNP      Allergies    Patient has no known allergies.    Review of Systems   Review of Systems  Physical Exam Updated Vital Signs BP (!) 151/106 (BP Location: Left Arm)   Pulse 88   Resp 20   Ht 5' 2.5" (1.588 m)   Wt 103.6 kg   LMP 03/20/2009 (Within Years)   SpO2 96%   BMI 41.11 kg/m  Physical Exam Vitals and nursing note reviewed.  Constitutional:      General: She is not in acute distress.    Appearance: She is well-developed.  HENT:     Head: Normocephalic and atraumatic.  Eyes:     General: No visual field deficit.    Pupils: Pupils are equal, round, and reactive to light.  Cardiovascular:     Rate and Rhythm: Normal rate and regular rhythm.     Heart sounds: Normal heart sounds. No murmur heard.    No friction rub.  Pulmonary:     Effort: Pulmonary effort is normal.     Breath sounds: Normal breath sounds. No wheezing or rales.  Abdominal:     General: Bowel sounds are normal. There is no distension.     Palpations: Abdomen is soft.     Tenderness: There is no abdominal tenderness. There is no guarding or rebound.  Musculoskeletal:        General: No tenderness. Normal range of motion.     Comments: No edema  Skin:    General: Skin is warm and dry.     Findings: No rash.  Neurological:     Mental Status: She is alert and oriented to person, place, and time.     Cranial Nerves: Cranial nerve deficit and facial asymmetry present. No dysarthria.     Sensory: Sensation is intact.     Motor: Motor function is intact. No pronator drift.     Coordination: Coordination is intact.  Finger-Nose-Finger Test and Heel to [place] Test normal.     Gait: Gait is intact. Gait normal.     Comments: Left-sided facial droop including the forehead, eyelid, corner of the mouth.  Psychiatric:        Behavior: Behavior normal.     ED Results / Procedures / Treatments   Labs (all labs ordered are listed, but only abnormal results are displayed) Labs Reviewed  CBC - Abnormal; Notable for the following components:      Result Value   WBC 13.5 (*)    All other components within normal limits  DIFFERENTIAL - Abnormal; Notable for the following components:   Neutro Abs 8.4 (*)    Abs Immature Granulocytes 0.20 (*)    All other components within normal limits  COMPREHENSIVE METABOLIC PANEL - Abnormal; Notable for the following components:   Total Protein 8.3 (*)    All other components within normal limits  RAPID URINE DRUG SCREEN, HOSP PERFORMED - Abnormal; Notable for the following components:   Tetrahydrocannabinol POSITIVE (*)    All other components within normal limits  CBG MONITORING, ED - Abnormal; Notable for the following components:   Glucose-Capillary 122 (*)    All other components within normal limits  ETHANOL  PROTIME-INR  APTT    EKG EKG Interpretation Date/Time:  Friday June 08 2023 10:37:58 EDT Ventricular Rate:  86 PR Interval:  158 QRS Duration:  57 QT Interval:  502 QTC Calculation: 601 R Axis:   70  Text Interpretation: Sinus rhythm Consider anterior infarct new  Prolonged QT interval Confirmed by Gwyneth Sprout ([id number]) on 06/08/2023 10:47:59 AM  Radiology MR BRAIN WO CONTRAST Result Date: 06/08/2023 CLINICAL DATA:  53 year old female code stroke presentation. EXAM: MRI HEAD WITHOUT CONTRAST TECHNIQUE: Multiplanar, multiecho pulse sequences of the brain and surrounding structures were obtained without intravenous contrast. COMPARISON:  Head CT 1019 hours today. FINDINGS: Brain: No restricted diffusion or evidence of acute infarction. Cerebral  volume is normal for age. No midline shift, mass effect, evidence of mass lesion, ventriculomegaly, extra-axial collection or acute intracranial hemorrhage. Cervicomedullary junction and pituitary are within normal limits. Wallace Cullens and white matter signal is within normal limits for age throughout the brain. No chronic cerebral blood products. Vascular: Major intracranial vascular flow voids are preserved. Skull and upper cervical spine: Visualized bone marrow signal is within normal limits. Negative visible cervical spine. Sinuses/Orbits: Negative orbits. Trace paranasal sinus mucosal thickening and mastoid fluid. Negative visible pharynx. Other: Negative visible scalp and face. IMPRESSION: No acute intracranial abnormality. Normal  for age noncontrast MRI appearance of the Brain. Electronically Signed   By: Odessa Fleming M.D.   On: 06/08/2023 12:29   CT HEAD CODE STROKE WO CONTRAST Result Date: 06/08/2023 CLINICAL DATA:  Code stroke. Neuro deficit, acute, stroke suspected. EXAM: CT HEAD WITHOUT CONTRAST TECHNIQUE: Contiguous axial images were obtained from the base of the skull through the vertex without intravenous contrast. RADIATION DOSE REDUCTION: This exam was performed according to the departmental dose-optimization program which includes automated exposure control, adjustment of the mA and/or kV according to patient size and/or use of iterative reconstruction technique. COMPARISON:  None. FINDINGS: Brain: Cerebral volume is normal. There is no acute intracranial hemorrhage. No demarcated cortical infarct. No extra-axial fluid collection. No evidence of an intracranial mass. No midline shift. Vascular: No hyperdense vessel. Atherosclerotic calcifications. Skull: No calvarial fracture or aggressive osseous lesion. Sinuses/Orbits: No mass or acute finding within the imaged orbits. No significant paranasal sinus disease. Other: Small left mastoid effusion. ASPECTS [facility] Stroke Program Early CT Score) - Ganglionic  level infarction (caudate, lentiform nuclei, internal capsule, insula, M1-M3 cortex): 7 - Supraganglionic infarction (M4-M6 cortex): 3 Total score (0-10 with 10 being normal): 10 No evidence of an acute intracranial abnormality. These results were called by telephone at the time of interpretation on 06/08/2023 at 10:47 am to provider [facility] , who verbally acknowledged these results. IMPRESSION: 1. No evidence of an acute intracranial abnormality. 2. Small left mastoid effusion. Electronically Signed   By: Jackey Loge D.O.   On: 06/08/2023 10:47    Procedures Procedures    Medications Ordered in ED Medications  valACYclovir (VALTREX) tablet 1,000 mg (has no administration in time range)  predniSONE (DELTASONE) tablet 60 mg (has no administration in time range)    ED Course/ Medical Decision Making/ A&P                                 Medical Decision Making Amount and/or Complexity of Data Reviewed External Data Reviewed: notes. Labs: ordered. Decision-making details documented in ED Course. Radiology: ordered and independent interpretation performed. Decision-making details documented in ED Course. ECG/medicine tests: ordered and independent interpretation performed. Decision-making details documented in ED Course.   Pt with multiple medical problems and comorbidities and presenting today with a complaint that caries a high risk for morbidity and mortality.  Here today with sudden onset of left-sided facial droop.  Show droop does involve the forehead all the way down the face.  Possibility that this is Bell's palsy but also concern for possible stroke given the sudden onset of symptoms.  Patient has no arm or leg involvement.  Symptoms started approximately 1 hour prior to arrival.  Code stroke was initiated.  Patient went for scan and blood work is pending. 1:17 PM Patient evaluated by Dr. Amada Jupiter and on his exam patient had some mild sensory deficit in addition to the  facial droop even though that was not expressed to her on my exam.  Concern for possible progressive deficits.  I have independently visualized and interpreted pt's images today.  Head CT without evidence of mass or bleed.  Radiology reports negative head CT.  Neurology recommended MRI for further evaluation but also this could be Bell's palsy.  I independently interpreted patient's EKG and labs.  EKG without acute findings except for prolonged QT.  CBC with mild leukocytosis of 13, CMP without acute findings, blood sugar 122. 1:17 PM  MRI today  is negative.  Patient symptoms are most likely coming from Bell's palsy.  She was treated with Valtrex and steroids.  Sent home with prescriptions as well as instructed to get lubricating eyedrops and an eye patch.         Final Clinical Impression(s) / ED Diagnoses Final diagnoses:  Bell palsy    Rx / DC Orders ED Discharge Orders     None         Gwyneth Sprout, MD 06/08/23 1317

## 2023-06-08 NOTE — ED Triage Notes (Signed)
 Pt c/o onset of about 9am of "face and left eye feeling funny" pt has slurred speech, left facial droop, no weakness in extremities

## 2023-06-08 NOTE — Consult Note (Signed)
 [facility] Telemedicine Consult   Requesting Provider: Salley Scarlet Consult Participants: Telestroke nursing, bedside nursing Location of the provider: [facility] Location of the patient: [facility]  This consult was provided via telemedicine with 2-way video and audio communication. The patient/family was informed that care would be provided in this way and agreed to receive care in this manner.    Chief Complaint: Facial weakness  HPI: 53 year old female who noticed that she was getting ready around 9 AM that the left side of her face was not working appropriately.  She had not noticed this prior to this, but she had also not been looking in a mirror and her boyfriend did not stay over last night, so nobody saw her before she noticed it was abnormal.  Initially it was just dry, but is progressed to involve the left side of her face over the course the morning.  Due to the symptoms, a code stroke was activated.    LKW: Prior to bed tnk given?: No, outside of window, mild symptoms IR Thrombectomy? No, mild symptoms Modified Rankin Scale: 0-Completely asymptomatic and back to baseline post- stroke Time of teleneurologist evaluation: 10:25  Exam:   General: In bed, NAD  1A: Level of Consciousness - 0 1B: Ask Month and Age - 0 1C: 'Blink Eyes' & 'Squeeze Hands' - 0 2: Test Horizontal Extraocular Movements - 0 3: Test Visual Fields - 0 4: Test Facial Palsy - 3 5A: Test Left Arm Motor Drift - 0 5B: Test Right Arm Motor Drift - 0 6A: Test Left Leg Motor Drift - 0 6B: Test Right Leg Motor Drift - 0 7: Test Limb Ataxia - 0 8: Test Sensation - 1, she reports symmetry to light touch, but when testing pinprick she sometimes feels like it is less intense on the left side compared to the right 9: Test Language/Aphasia- 0 10: Test Dysarthria - 0 11: Test Extinction/Inattention - 0 NIHSS score: 4   Imaging Reviewed: CT head is negative  Labs reviewed in epic and  pertinent values follow: CBG 122   Assessment: 53 year old female with a history of recent prolonged illness discharged from the hospital in late January, significant stress due to this illness and being out of work for a while.  In that setting, she has developed left-sided facial paralysis involving the forehead.  I do sometimes find that people with Bell's palsy will initially report some degree of sensory change given that they are fixating on the fact that the left side of her face is not working, but with subsequent encounters are more able to differentiate between motor and sensory.  That being said, given that she did give some degree of numbness, I think that further imaging with MRI would be prudent as that would make it not an isolated 7th nerve palsy.  If MRI is negative, I would favor treating this as Bell's palsy with steroids and valacyclovir.  Recommendations:  MRI brain If negative, would do prednisone 60 mg daily for 7 days as well as Valtrex 1 g 3 times daily for 7 days Encourage use of refresh PM or some similar eye protection ointment at night.    Ritta Slot, MD [facility] [phone]  If 7pm- 7am, please page neurology on call as listed in AMION.

## 2023-06-08 NOTE — Discharge Instructions (Addendum)
 You need to get an eye patch to wear at night so you don't scratch you eye.  You should also use lubricated eye drops for the left eye while your face is drooping and weak so your eye does not dry out.  Take the next dose of medications tomorrow.

## 2023-06-09 ENCOUNTER — Other Ambulatory Visit: Payer: Self-pay | Admitting: Internal Medicine

## 2023-06-09 DIAGNOSIS — F419 Anxiety disorder, unspecified: Secondary | ICD-10-CM

## 2023-06-09 MED ORDER — HYDROXYZINE HCL 25 MG PO TABS
25.0000 mg | ORAL_TABLET | Freq: Three times a day (TID) | ORAL | 1 refills | Status: DC | PRN
Start: 2023-06-09 — End: 2023-07-17

## 2023-06-11 ENCOUNTER — Telehealth: Payer: Self-pay

## 2023-06-11 NOTE — Telephone Encounter (Unsigned)
 Copied from CRM [id number]. 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 Callback number is [phone]. Fax [phone]

## 2023-06-11 NOTE — Transitions of Care (Post Inpatient/ED Visit) (Signed)
   06/11/2023  Name: Michele [name] MRN: [id number] DOB: Oct 02, 1970  Today's TOC FU Call Status: Today's TOC FU Call Status:: Unsuccessful Call (1st Attempt) Unsuccessful Call (1st Attempt) Date: 06/11/23  Attempted to reach the patient regarding the most recent Inpatient/ED visit.  Follow Up Plan: Additional outreach attempts will be made to reach the patient to complete the Transitions of Care (Post Inpatient/ED visit) call.   Signature  [employer], [place]

## 2023-06-13 ENCOUNTER — Ambulatory Visit: Payer: Self-pay

## 2023-06-13 NOTE — Telephone Encounter (Signed)
 Copied from CRM [id number]. Topic: Clinical - Red Word Triage >> Jun 13, 2023 12:54 PM Emylou G wrote: [facility] that prompted transfer to Nurse Triage: Feeling under pressure, stress, feels like wanting to give up  Chief Complaint: anxiety Symptoms: anxious, stress feels like giving up Frequency: constant Pertinent Negatives: Patient denies cp, sob,  Disposition: [] ED /[x] Urgent Care (no appt availability in office) / [] Appointment(In office/virtual)/ []  [place] [facility]/ [] Home Care/ [] Refused Recommended Disposition /[] [place] [facility]/ []  Follow-up with PCP Additional Notes: instructed to go to uc due to apts being at end of April.  Patient has multiple reasons why she can't make apts and can't go to UC.  Attempted to give resources but patient adamant about no one wanting to help her.   Reason for Disposition  [1] Panic attack symptoms (diagnosed in the past) AND [2] not better with usual treatment, reassurance, or Care Advice  Answer Assessment - Initial Assessment Questions 1. CONCERN: "Did anything happen that prompted you to call today?"      Feeling under pressure, stress, feels like wanting to give up 2. ANXIETY SYMPTOMS: "Can you describe how you (your loved one; patient) have been feeling?" (e.g., tense, restless, panicky, anxious, keyed up, overwhelmed, sense of impending doom).      Has medication and is currently out of meds 3. ONSET: "How long have you been feeling this way?" (e.g., hours, days, weeks)     Was in the hospital in January.  4. SEVERITY: "How would you rate the level of anxiety?" (e.g., 0 - 10; or mild, moderate, severe).     20/10 5. FUNCTIONAL IMPAIRMENT: "How have these feelings affected your ability to do daily activities?" "Have you had more difficulty than usual doing your normal daily activities?" (e.g., getting better, same, worse; self-care, school, work, interactions)     Yes, missing work 6. HISTORY: "Have you felt this way before?" "Have  you ever been diagnosed with an anxiety problem in the past?" (e.g., generalized anxiety disorder, panic attacks, PTSD). If Yes, ask: "How was this problem treated?" (e.g., medicines, counseling, etc.)     Yes, has meds but are out of them 7. RISK OF HARM - SUICIDAL IDEATION: "Do you ever have thoughts of hurting or killing yourself?" If Yes, ask:  "Do you have these feelings now?" "Do you have a plan on how you would do this?"     denies 8. TREATMENT:  "What has been done so far to treat this anxiety?" (e.g., medicines, relaxation strategies). "What has helped?"     Medicine  9. TREATMENT - THERAPIST: "Do you have a counselor or therapist? Name?"     denies 10. POTENTIAL TRIGGERS: "Do you drink caffeinated beverages (e.g., coffee, colas, teas), and how much daily?" "Do you drink alcohol or use any drugs?" "Have you started any new medicines recently?"       pepsi 11. PATIENT SUPPORT: "Who is with you now?" "Who do you live with?" "Do you have family or friends who you can talk to?"        family 12. OTHER SYMPTOMS: "Do you have any other symptoms?" (e.g., feeling depressed, trouble concentrating, trouble sleeping, trouble breathing, palpitations or fast heartbeat, chest pain, sweating, nausea, or diarrhea)       Just overwhelmed 13. PREGNANCY: "Is there any chance you are pregnant?" "When was your last menstrual period?"       na  Protocols used: Anxiety and Panic Attack-A-AH

## 2023-06-14 ENCOUNTER — Encounter: Payer: Self-pay | Admitting: Pulmonary Disease

## 2023-06-14 ENCOUNTER — Telehealth: Payer: Self-pay

## 2023-06-14 ENCOUNTER — Other Ambulatory Visit (HOSPITAL_COMMUNITY): Payer: Self-pay

## 2023-06-14 ENCOUNTER — Ambulatory Visit (INDEPENDENT_AMBULATORY_CARE_PROVIDER_SITE_OTHER): Payer: 59 | Admitting: Pulmonary Disease

## 2023-06-14 ENCOUNTER — Ambulatory Visit: Payer: 59 | Admitting: Pulmonary Disease

## 2023-06-14 VITALS — BP 163/91 | HR 83 | Ht 63.5 in | Wt 215.6 lb

## 2023-06-14 DIAGNOSIS — J9601 Acute respiratory failure with hypoxia: Secondary | ICD-10-CM

## 2023-06-14 DIAGNOSIS — R0681 Apnea, not elsewhere classified: Secondary | ICD-10-CM | POA: Diagnosis not present

## 2023-06-14 DIAGNOSIS — J449 Chronic obstructive pulmonary disease, unspecified: Secondary | ICD-10-CM | POA: Diagnosis not present

## 2023-06-14 DIAGNOSIS — G4710 Hypersomnia, unspecified: Secondary | ICD-10-CM

## 2023-06-14 DIAGNOSIS — F419 Anxiety disorder, unspecified: Secondary | ICD-10-CM | POA: Diagnosis not present

## 2023-06-14 DIAGNOSIS — J431 Panlobular emphysema: Secondary | ICD-10-CM

## 2023-06-14 DIAGNOSIS — J9610 Chronic respiratory failure, unspecified whether with hypoxia or hypercapnia: Secondary | ICD-10-CM

## 2023-06-14 LAB — PULMONARY FUNCTION TEST
DL/VA % pred: 111 %
DL/VA: 4.81 ml/min/mmHg/L
DLCO cor % pred: 85 %
DLCO cor: 17.51 ml/min/mmHg
DLCO unc % pred: 88 %
DLCO unc: 18.22 ml/min/mmHg
FEF 25-75 Post: 0.7 L/s
FEF 25-75 Pre: 0.65 L/s
FEF2575-%Change-Post: 6 %
FEF2575-%Pred-Post: 26 %
FEF2575-%Pred-Pre: 24 %
FEV1-%Change-Post: 16 %
FEV1-%Pred-Post: 50 %
FEV1-%Pred-Pre: 43 %
FEV1-Post: 1.36 L
FEV1-Pre: 1.17 L
FEV1FVC-%Change-Post: 20 %
FEV1FVC-%Pred-Pre: 65 %
FEV6-%Change-Post: -4 %
FEV6-%Pred-Post: 63 %
FEV6-%Pred-Pre: 66 %
FEV6-Post: 2.13 L
FEV6-Pre: 2.22 L
FEV6FVC-%Change-Post: 0 %
FEV6FVC-%Pred-Post: 101 %
FEV6FVC-%Pred-Pre: 102 %
FVC-%Change-Post: -3 %
FVC-%Pred-Post: 62 %
FVC-%Pred-Pre: 64 %
FVC-Post: 2.16 L
FVC-Pre: 2.23 L
Post FEV1/FVC ratio: 63 %
Post FEV6/FVC ratio: 99 %
Pre FEV1/FVC ratio: 52 %
Pre FEV6/FVC Ratio: 99 %
RV % pred: 174 %
RV: 3.15 L
TLC % pred: 108 %
TLC: 5.42 L

## 2023-06-14 NOTE — Telephone Encounter (Signed)
 Patient come and picked up paperwork 06/14/23

## 2023-06-14 NOTE — Patient Instructions (Signed)
 Full PFT Performed Today

## 2023-06-14 NOTE — Progress Notes (Signed)
 Full PFT Performed Today

## 2023-06-14 NOTE — Telephone Encounter (Signed)
 PT was seen today gave paperwork for Dr. Val Eagle to fill out and add note to why she was out of work and requested to have mailed back

## 2023-06-14 NOTE — Patient Instructions (Signed)
 Will fill out the FMLA form  Continue to use your inhalers  Continue to stay away from cigarettes  Graded exercise as tolerated  Follow-up in about 3 months

## 2023-06-14 NOTE — Progress Notes (Signed)
 Michele [name]    [id number]    [birth year]  Primary Care Physician:Paseda, Baird Kay, FNP  Referring Physician: Donell Beers, FNP [phone] [address], [address] [place],  [place] [address]  Chief complaint:   Follow-up for recent hospitalization  HPI:  Chronic respiratory failure, recently quit smoking  Unfortunately recently diagnosed with Bell's palsy, right-sided facial weakness on March 21  Shortness of breath, congestion, limited activities with shortness of breath with activity  No chest pain or chest discomfort  Has been using inhalers which seem to help a little bit  Does have some hip pain  History of snoring, no witnessed apneas, wakes up gasping sometimes Sleep is nonrestorative Wakes up feeling tired Admits to dryness of the mouth in the morning, headaches sometimes Some confusion since recent hospitalization  Has been compliant with inhalers  Smoked up to 2 packs a day, has not been smoking, uses a nicotine patch  Did have asthma as a child but she felt she outgrew it   Outpatient Encounter Medications as of 06/14/2023  Medication Sig   acetaminophen (TYLENOL) 500 MG tablet Take 1 tablet (500 mg total) by mouth every 6 (six) hours as needed for mild pain (pain score 1-3).   albuterol (VENTOLIN HFA) 108 (90 Base) MCG/ACT inhaler Inhale 2 puffs into the lungs every 4 (four) hours as needed for wheezing or shortness of breath.   hydrOXYzine (ATARAX) 25 MG tablet Take 1 tablet (25 mg total) by mouth 3 (three) times daily as needed for anxiety.   ipratropium-albuterol (DUONEB) 0.5-2.5 (3) MG/3ML SOLN Take 3 mLs by nebulization every 4 (four) hours as needed.   mometasone-formoterol (DULERA) 200-5 MCG/ACT AERO Inhale 2 puffs into the lungs 2 (two) times daily.   nicotine (NICODERM CQ - DOSED IN MG/24 HR) 7 mg/24hr patch Place 1 patch (7 mg total) onto the skin daily.   predniSONE (DELTASONE) 10 MG tablet Take 6 tablets (60 mg total) by mouth  daily for 7 days.   sertraline (ZOLOFT) 25 MG tablet Take 1 tablet (25 mg total) by mouth daily.   valACYclovir (VALTREX) 1000 MG tablet Take 1 tablet (1,000 mg total) by mouth 3 (three) times daily.   No facility-administered encounter medications on file as of 06/14/2023.    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   COPD (chronic obstructive pulmonary disease) (HCC)    Fibroid 04/07/2015   Medical history non-contributory    Tobacco use     Past Surgical History:  Procedure Laterality Date   BILATERAL SALPINGECTOMY  04/07/2015   Procedure: BILATERAL SALPINGECTOMY with left oophorectomy, left ovarian cystectomy and removal of pelvic mass;  Surgeon: Kathreen Cosier, MD;  Location: [facility];  Service: Gynecology;;   CESAREAN SECTION     WISDOM TOOTH EXTRACTION      Family History  Problem Relation Age of Onset   Cancer Mother    Cervical cancer Mother    Cancer Maternal Aunt    Cancer Maternal Uncle     Social History   Socioeconomic History   Marital status: Divorced    Spouse name: Not on file   Number of children: 2   Years of education: Not on file   Highest education level: High school graduate  Occupational History   Not on file  Tobacco Use   Smoking status: Former    Current packs/day: 0.50    Types: Cigarettes   Smokeless tobacco: Never  Vaping Use   Vaping status: Never Used  Substance and Sexual Activity   Alcohol use: Yes    Comment: occassional   Drug use: No   Sexual activity: Yes    Birth control/protection: Post-menopausal  Other Topics Concern   Not on file  Social History Narrative   Lives with her boyfriend    Social Drivers of Health   Financial Resource Strain: Not on file  Food Insecurity: Patient Unable To Answer (04/06/2023)   Hunger Vital Sign    Worried About Running Out of  Food in the Last Year: Patient unable to answer    Ran Out of Food in the Last Year: Patient unable to answer  Transportation Needs: Patient Unable To Answer (04/06/2023)   PRAPARE - Transportation    Lack of Transportation (Medical): Patient unable to answer    Lack of Transportation (Non-Medical): Patient unable to answer  Physical Activity: Not on file  Stress: Not on file  Social Connections: Not on file  Intimate Partner Violence: Patient Unable To Answer (04/06/2023)   Humiliation, Afraid, Rape, and Kick questionnaire    Fear of Current or Ex-Partner: Patient unable to answer    Emotionally Abused: Patient unable to answer    Physically Abused: Patient unable to answer    Sexually Abused: Patient unable to answer    Review of Systems  Respiratory:  Positive for cough and shortness of breath.   Psychiatric/Behavioral:  Positive for sleep disturbance.     Vitals:   06/14/23 1613  BP: (!) 163/91  Pulse: 83  SpO2: 92%     Physical Exam Constitutional:      Appearance: She is obese.  HENT:     Head: Normocephalic.     Mouth/Throat:     Mouth: Mucous membranes are moist.  Eyes:     General: No scleral icterus. Cardiovascular:     Rate and Rhythm: Normal rate and regular rhythm.     Heart sounds: No murmur heard.    No friction rub.  Pulmonary:     Effort: No respiratory distress.     Breath sounds: No stridor. No wheezing or rhonchi.  Musculoskeletal:     Cervical back: No rigidity or tenderness.  Neurological:     Mental Status: She is alert.     Comments: Left-sided facial weakness  Psychiatric:        Mood and Affect: Mood normal.    Epworth of 11  Data Reviewed: Previous CT scan of the chest shows evidence of emphysema  Recent hospital records reviewed  Previous echocardiogram within normal limits  Pulmonary function test shows severe obstructive disease with significant bronchodilator response 06/14/2023  Assessment:  Obstructive lung  disease Severe obstructive disease Grade 3 chronic obstructive pulmonary disease  Chronic respiratory failure -Overall feeling better  Anxiety  Nonrestorative sleep Moderate to high probability of significant obstructive sleep apnea -Sleep test is pending  Plan/Recommendations: Sleep test pending  Continue oxygen supplementation  Graded activities as tolerated  Encourage weight loss efforts  Encouraged to call with significant concerns  Tentative follow-up in about 3 months  Continue current inhalers    Virl Diamond MD [facility] 06/14/2023, 4:19 PM  CC: Donell Beers, FNP

## 2023-06-15 ENCOUNTER — Ambulatory Visit: Payer: Self-pay | Admitting: Nurse Practitioner

## 2023-06-15 NOTE — Telephone Encounter (Signed)
 Anxiety past few weeks, worsening  Symptoms: Mind racing, drooping face (Bells Palsy), depressed Pertinent Negatives: Patient denies HI or SI  Disposition: [x] Urgent Care (no appt availability in office)  Additional Notes: Pt states she is very anxious and depressed. Pt denies HI or SI.  This RN recommends pt goes to [facility] in [place]. Pt confirms she knows location, however, unsure if pt will go. This RN lets pt know that her PCP will be notified of symptoms. This RN educated pt on when to call back/seek emergent care. Pt verbalized understanding and agrees to plan.   Copied from CRM [id number]. Topic: Clinical - Red Word Triage >> Jun 15, 2023  5:12 PM Ja-Kwan M wrote: Red Word that prompted transfer to Nurse Triage: Patient experiencing anxiety.She is very emotional and reports that she has bell's palsy Reason for Disposition  Patient sounds very upset or troubled to the triager  Answer Assessment - Initial Assessment Questions Anxiety past few weeks, worsening  Symptoms: Mind racing, drooping face (Bells Palsy), depressed Pertinent Negatives: Patient denies HI or SI  Protocols used: Anxiety and Panic Attack-A-AH

## 2023-06-18 ENCOUNTER — Telehealth: Payer: Self-pay

## 2023-06-18 NOTE — Telephone Encounter (Signed)
 Copied from CRM [id number]. Topic: General - Other >> Jun 18, 2023 12:50 PM Priscille Loveless wrote: Reason for CRM: Danton Sewer with [employer] called and was going over FMLA paperwork and they need a diagnosis code for her paper work. She states that this is the 3rd time trying to get it.   Please advise.  Printed and will work on It [employer]

## 2023-06-18 NOTE — Telephone Encounter (Signed)
 Patient could also be given info for walk in for behavioral health. Thanks. Please discuss with Mitzi Davenport - PCP.

## 2023-06-24 ENCOUNTER — Ambulatory Visit (HOSPITAL_BASED_OUTPATIENT_CLINIC_OR_DEPARTMENT_OTHER): Payer: 59 | Attending: Pulmonary Disease | Admitting: Pulmonary Disease

## 2023-06-24 DIAGNOSIS — G4733 Obstructive sleep apnea (adult) (pediatric): Secondary | ICD-10-CM | POA: Diagnosis not present

## 2023-06-24 DIAGNOSIS — R0681 Apnea, not elsewhere classified: Secondary | ICD-10-CM

## 2023-06-25 NOTE — Telephone Encounter (Signed)
 Got signed fax from Dr. Wynona Neat on [facility]. Sent to [employer] [phone], got confirmation, sent copy to PT.

## 2023-07-15 ENCOUNTER — Telehealth: Payer: Self-pay | Admitting: Pulmonary Disease

## 2023-07-15 DIAGNOSIS — R0681 Apnea, not elsewhere classified: Secondary | ICD-10-CM

## 2023-07-15 DIAGNOSIS — G4733 Obstructive sleep apnea (adult) (pediatric): Secondary | ICD-10-CM

## 2023-07-15 NOTE — Telephone Encounter (Signed)
 Call patient  Sleep study result  Date of study: 06/24/2023  Impression: Severe obstructive sleep apnea with mild oxygen desaturations  Recommendation: DME referral  Recommend CPAP therapy for severe obstructive sleep apnea  Auto titrating CPAP with pressure settings of 14-19 will be appropriate, patient's mask of choice was a medium ResMed AirFit F10 mask  Encourage weight loss measures  Follow-up in the office 4 to 6 weeks following initiation of treatment

## 2023-07-15 NOTE — Procedures (Signed)
 Michele [name] [facility] [address] [place], [place] [address] Tel: [phone]   Fax: [phone]  Split Night Interpretation  Patient Name:  Michele [name], [place] Date:  06/24/2023 Referring Physician:  Myer Artis, Md  Indications for Polysomnography The patient is a 53 year old Female who is 5\' 2"  and weighs 200.0 lbs.  Her BMI equals 36.8.  A diagnostic polysomnogram was performed to evaluate for -.  After 121.0 minutes of sleep time the patient exhibited sufficient respiratory events qualifying her for a CPAP trial which was then initiated.    Medication no medication taken  No Data.   Polysomnogram Data A full night polysomnogram was performed recording the standard physiologic parameters including EEG, EOG, EMG, EKG, nasal and oral airflow.  Respiratory parameters of chest and abdominal movements are recorded with [place] motion transducers.  Oxygen saturation was recorded by pulse oximetry.    Sleep Architecture The total recording time of the diagnostic portion of the study was 146.5 minutes.  The total sleep time was 121.0 minutes.  During the diagnostic portion of the study, the patient spent 10.3% of total sleep time in Stage N1, 89.7% in Stage N2, 0.0% in Stages N3, and 0.0% in REM.   Sleep latency was 6.5 minutes.  REM latency was - minutes.  Sleep Efficiency was 82.6%.  Wake after Sleep Onset time was 19.0 minutes.   At 12:31:11 AM the patient was placed on PAP treatment and was titrated at pressures ranging from Off cm/H20 with supplemental oxygen at O2: 1 up to 20* cm/H20 with supplemental oxygen at -.  The total recording time of the treatment portion of the study was 220.9 minutes.  The total sleep time was 205.0 minutes.  During the treatment portion of the study, the patient spent 5.1% of total sleep time in Stage N1, 34.4% in Stage N2, 10.2% in Stages N3, and 50.2% in REM.   Sleep latency was 0.0 minutes.  REM latency was 24.0  minutes.  Sleep Efficiency was 92.8%.  Wake after Sleep Onset time was 15.5 minutes.  Respiratory Events During the diagnostic portion of the study, the polysomnogram revealed a presence of - obstructive, - central, and - mixed apneas resulting in an Apnea index of - events per hour.  There were 66 hypopneas (>=3% desaturation and/or arousal) resulting in an Apnea\Hypopnea Index (AHI >=3% desaturation and/or arousal) of 32.7 events per hour.  There were 15 hypopneas (>=4% desaturation) resulting in an Apnea\Hypopnea Index (AHI >=4% desaturation) of 7.4 events per hour.  There were 42 Respiratory Effort Related Arousals resulting in a RERA index of 20.8 events per hour. The Respiratory Disturbance Index is 53.6 events per hour.  The snore index was 367.9 events per hour.  Mean oxygen saturation was 93.0%.  The lowest oxygen saturation during sleep was 87.0%.  Time spent <=88% oxygen saturation was 11.5 minutes (7.8%).  End Tidal CO2 during sleep ranged from - to - mmHg. End Tidal CO2 was greater than 50 mmHg for - minutes and greater than 55 mmHg for - minutes.  During the treatment portion of the study, the polysomnogram revealed a presence of - obstructive, - central, and - mixed apneas resulting in an Apnea index of - events per hour.  There were 63 hypopneas (>=3% desaturation and/or arousal) resulting in an Apnea\Hypopnea Index (AHI >=3% desaturation and/or arousal) of 18.4 events per hour.  There were 37 hypopneas (>=4% desaturation) resulting in an Apnea\Hypopnea Index (AHI >=4% desaturation) of 10.8 events per  hour.  There were 7 Respiratory Effort Related Arousals resulting in a RERA index of 2.0 events per hour. The Respiratory Disturbance Index is 20.5 events per hour.  The snore index was 201.1 events per hour.  Mean oxygen saturation was 90.7%.  The lowest oxygen saturation during sleep was 81.0%.  Time spent <=88% oxygen saturation was 31.8 minutes (14.4%).  Limb Activity During the diagnostic  portion of the study, there were 150 limb movements recorded.  Of this total, 142 were classified as PLMs.  Of the PLMs, 9 were associated with arousals.  The Limb Movement index was 74.4 per hour while the PLM index was 70.4 per hour.  During the treatment portion of the study, there were 7 limb movements recorded.  Of this total, 7 were classified as PLMs.  Of the PLMs, 7 were associated with arousals.  The Limb Movement index was 2.0 per hour while the PLM index was 2.0 per hour.  Cardiac Summary During the diagnostic portion of the study, the average pulse rate was 62.6 bpm.  The minimum pulse rate was 50.0 bpm while the maximum pulse rate was 84.0 bpm.  During the treatment portion of the study, the average pulse rate was 64.6 bpm.  The minimum pulse rate was 50.0 bpm while the maximum pulse rate was 88.0 bpm.   Diagnosis:  Severe obstructive sleep apnea with mild oxygen desaturations Titrated to CPAP with good control of events Excellent sleep efficiency, achieved REM sleep  Recommendations: Recommend CPAP Auto CPAP with settings of 14-19 with heated humidification with patient's mask of choice-medium ResMed AirFit F10 mask A fixed pressure of 18 may also be considered. Encourage weight loss measures   This study was personally reviewed and electronically signed by: Myer Artis, Md Accredited Board Certified in Sleep Medicine Date/Time: 07/15/23       Split Night Report  Patient Name: [name], Michele Date: 06/24/2023  Date of Birth: [birth year] Study Type: Split Night  Age: 53 year MRN #: [id number]  Sex: Female Interpreting Physician: Myer Artis [id number]  Height: 5\' 2"  Referring Physician: Myer Artis, Md  Weight: 200.0 lbs Recording Tech: Iva Mariner RPSGT RST  BMI: 36.8 Scoring Tech: Iva Mariner RPSGT RST  ESS: 9 Neck Size: 16  Mask Type Airfit F10  Resumed  Final Pressure: 20cm H20  Mask Size: Medium Supplemental O2: 1  during the diagnostic  study   Study Overview  DIAGNOSTIC TREATMENT  Lights Off: 10:04:42 PM Lights Off: 12:31:11 AM  Lights On: 12:31:11 AM Lights On: 04:12:07 AM  Time in Bed: 146.5 min. Time in Bed: 220.9 min.  Total Sleep Time: 121.0 min. Total Sleep Time: 205.0 min.  Sleep Efficiency: 82.6% Sleep Efficiency: 92.8%  Sleep Latency: 6.5 min. Sleep Latency: 0.0 min.  REM Latency from Sleep Onset: - min. REM Latency from Sleep Onset: 24.0 min.  Wake After Sleep Onset: 19.0 min. Wake After Sleep Onset: 15.5 min.   DIAGNOSTIC TREATMENT   Count Index  Count Index  Awakenings: 11 5.5 Awakenings: 13 3.8  Arousals: 95 47.1 Arousals: 86 25.2  AHI (>=3% Desat and/or Ar.): 66 32.7 AHI (>=3% Desat and/or Ar.): 63 18.4  AHI (>=4% Desat): 15 7.4 AHI (>=4% Desat): 37 10.8   Limb Movements: 150 74.4 Limb Movements: 7 2.0  Snore: 742 367.9 Snore: 687 201.1  Desaturations: 63 31.2 Desaturations: 66 19.3  Minimum SpO2 TST: 87.0% Minimum SpO2 TST: 81.0%    Sleep Architecture   DIAGNOSTIC TREATMENT ENTIRE NIGHT  Stages Time (mins) %  Sleep Time Time (mins) % Sleep Time Time (mins) % Sleep Time  Wake 25.5  16.0  41.5   Stage N1 12.5 10.3% 10.5 5.1% 23.0 7.1%  Stage N2 108.5 89.7% 70.5 34.4% 179.0 54.9%  Stage N3 0.0 0.0% 21.0 10.2% 21.0 6.4%  REM 0.0 0.0% 103.0 50.2% 103.0 31.6%   Arousal Summary   DIAGNOSTIC TREATMENT   NREM REM TST Index NREM REM TST Index  Respiratory Ar. 78 - 78 38.7 48 3 51 14.9  PLM Ar. 9 - 9 4.5 7 - 7 2.0  Isolated Limb Movement Ar. - - - - - - - -  Snore Ar. 6 - 6 3.0 7 - 7 2.0  Spontaneous Ar. 7 - 7 3.5 14 8 22  6.4  Total Ar. 95 - 95 47.1 75 11 86 25.2    Respiratory Summary  DIAGNOSTIC By Sleep Stage By Body Position Total   NREM REM Supine Non-Supine   Time (min) 121.0 0.0 2.5 118.5 121.0         Obstructive Apnea - - - - -  Mixed Apnea - - - - -  Central Apnea - - - - -  Total Apneas - - - - -  Total Apnea Index - - - - -         Hypopneas (>=3% Desat and/or Ar.) 66 - - 66  66  AHI (>=3% Desat and/or Ar.) 32.7 - - 33.4 32.7         Hypopneas (>=4% Desat) 15 - - 15 15  AHI (>=4% Desat) 7.4 - - 7.6 7.4          RERAs 42 - - 42 42  RERA Index 20.8 - - 21.3 20.8         RDI 53.6 - - 54.7 53.6    TREATMENT By Sleep Stage By Body Position Total   NREM REM Supine Non-Supine   Time (min) 102.0 103.0 56.0 149.0 205.0         Obstructive Apnea - - - - -  Mixed Apnea - - - - -  Central Apnea - - - - -  Total Apneas - - - - -  Code h - - - - -         Projected hypopneas (>=3% Desat and/or Ar.) 58 5 - 63 63  AHI (>=3% Desat and/or Ar.) 34.1 2.9 - 25.4 18.4         Hypopneas (>=4% Desat) 36 1 - 37 37  AHI (>=4% Desat) 21.2 0.6 - 14.9 10.8          RERAs 5 2 - 7 7  RERA Index 2.9 1.2 - 2.8 2.0         RDI 37.1 4.1 - 28.2 20.5    Respiratory Event Durations   DIAGNOSTIC TREATMENT  Apnea NREM REM NREM REM  Average (seconds) - - - -  Maximum (seconds) - - - -  Hypopnea      Average (seconds) 27.8 - 27.2 26.0  Maximum (seconds) 83.5 - 71.9 42.6    Limb Movement Summary   DIAGNOSTIC TREATMENT   Count Index Count Index  Isolated Limb Movements 8 4.0 - -  Periodic Limb Movements (PLMs) 142 70.4 7 2.0  Total Limb Movements 150 74.4 7 2.0    Oxygen Saturation Summary   DIAGNOSTIC TREATMENT   Wake NREM REM TST Wake NREM REM TST  Average SpO2 91.9% 93.3% - 93.3% 92.3% 90.1% 91.0% 90.5%  Minimum SpO2  87.0% 87.0% - 87.0%  85.0% 81.0% 83.0% 81.0%   Maximum SpO2 97.0% 97.0% - 97.0%  97.0% 96.0% 95.0% 96.0%    DIAGNOSTIC Oxygen Saturation Distribution  Range (%) Time in range (min) Time in range (%)   90.0 - 100.0 132.1 90.2%  80.0 - 90.0 14.4 9.8%  70.0 - 80.0 - -  60.0 - 70.0 - -  50.0 - 60.0 - -  0.0 - 50.0 - -  Time Spent <=88% SpO2  Range (%) Time in range (min) Time in range (%)  0.0 - 88.0 11.5 7.8%      Count Index  Desaturations: 63 31.2   TREATMENT Oxygen Saturation Distribution  Range (%) Time in range (min) Time in range  (%)   90.0 - 100.0 134.0 60.7%  80.0 - 90.0 86.6 39.3%  70.0 - 80.0 - -  60.0 - 70.0 - -  50.0 - 60.0 - -  0.0 - 50.0 - -  Time Spent <=88% SpO2  Range (%) Time in range (min) Time in range (%)  0.0 - 88.0 31.8 14.4%      Count Index  Desaturations: 66 19.3     Cardiac Summary   DIAGNOSTIC TREATMENT   Wake NREM REM Total Wake NREM REM Total  Average Pulse Rate (BPM) 67.2 61.6 - 62.6 68.6 59.7 68.8 64.6  Minimum Pulse Rate (BPM) 51.0 50.0 - 50.0 52.0 50.0 53.0 50.0  Maximum Pulse Rate (BPM) 84.0 80.0 - 84.0 88.0 86.0 80.0 88.0   Pulse Rate Distribution   DIAGNOSTIC  Range (bpm) Time in range (min) Time in range (%)  0.0 - 40.0 - -  40.0 - 60.0 46.4 31.7%  60.0 - 80.0 99.8 68.2%  80.0 - 100.0 0.2 0.1%  100.0 - 120.0 - -  120.0 - 140.0 - -  140.0 - 200.0 - -   TREATMENT  Range (bpm) Time in range (min) Time in range (%)  0.0 - 40.0 - -  40.0 - 60.0 68.5 31.0%  60.0 - 80.0 151.7 68.7%  80.0 - 100.0 0.8 0.3%  100.0 - 120.0 - -  120.0 - 140.0 - -  140.0 - 200.0 - -   EtCO2 Summary - Diagnostic  Stage Min (mmHg) Average (mmHg) Max (mmHg)  Wake - - -  NREM (1+2+3) - - -  REM - - -   EtCO2 Distribution:  Range (mmHg) Time in range (min) Time in range (%)  20.0 - 40.0 - -  40.0 - 50.0 - -  50.0 - 100.0 - -  55.0 - 100.0 - -  Excluded data <20.0 & >65.0 146.5 100.0%   Titration Summary  PAP Device PAP Level O2 Level Time (min) Wake (min) NREM (min) REM (min) Sleep Eff% OA# CA# MA# Hyp# (>=3%) AHI (>=3%) Hyp# (>=4%) AHI (>=%4) RERA RDI OSat <=88% (min) Min [employer] Ar. Index  - Off - 13.0 8.0 5.0 0.0 38.5% - - - - - -  - -  -  5.0 87.0 87.3 12.0  - Off O2: 1 133.5 17.5 116.0 0.0 86.9% - - - 66 34.1 15  7.8 42  55.9  0.2 88.0 93.5 48.6  CPAP 6 - 17.0 4.5 12.5 0.0 73.5% - - - 19 91.2 8  38.4 -  91.2  1.8 85.0 90.7 96.0  CPAP 8 - 7.5 0.0 7.0 0.5 100.0% - - - 10 80.0 5  40.0 2  96.0  2.2 86.0 89.3 96.0  CPAP 10 - 9.5  1.0 4.0 4.5 89.5% - - - 10 70.6 6   42.4 -  70.6  5.3 83.0 88.2 49.4  CPAP 12 - 9.5 0.0 9.5 0.0 100.0% - - - 13 82.1 10  63.2 2  94.7  7.0 81.0 86.9 82.1  CPAP 14 - 12.5 0.5 12.0 0.0 96.0% - - - 9 45.0 7  35.0 1  50.0  5.4 85.0 89.1 35.0  CPAP 16 - 17.5 0.5 1.0 16.0 97.1% - - - 2 7.1 1  3.5 1  10.6  9.3 85.0 88.4 10.6  CPAP 18 - 26.5 0.0 0.0 26.5 100.0% - - - - - -  - 1  2.3  0.1 88.0 91.1 -  CPAP 19 - 77.0 3.5 18.0 55.5 95.5% - - - - - -  - -  -  0.0 89.0 91.9 9.0  CPAP 20 - 44.0 6.0 38.0 0.0 86.4% - - - - - -  - -  -  0.0 89.0 90.5 20.5    Hypnograms                           Technologist Comments  -

## 2023-07-17 ENCOUNTER — Encounter: Payer: Self-pay | Admitting: Nurse Practitioner

## 2023-07-17 ENCOUNTER — Ambulatory Visit (INDEPENDENT_AMBULATORY_CARE_PROVIDER_SITE_OTHER): Payer: Self-pay | Admitting: Nurse Practitioner

## 2023-07-17 VITALS — BP 130/78 | HR 95 | Temp 99.0°F | Wt 222.0 lb

## 2023-07-17 DIAGNOSIS — G510 Bell's palsy: Secondary | ICD-10-CM | POA: Insufficient documentation

## 2023-07-17 DIAGNOSIS — Z720 Tobacco use: Secondary | ICD-10-CM | POA: Diagnosis not present

## 2023-07-17 DIAGNOSIS — G4733 Obstructive sleep apnea (adult) (pediatric): Secondary | ICD-10-CM

## 2023-07-17 DIAGNOSIS — F419 Anxiety disorder, unspecified: Secondary | ICD-10-CM

## 2023-07-17 DIAGNOSIS — J449 Chronic obstructive pulmonary disease, unspecified: Secondary | ICD-10-CM | POA: Insufficient documentation

## 2023-07-17 DIAGNOSIS — E119 Type 2 diabetes mellitus without complications: Secondary | ICD-10-CM

## 2023-07-17 MED ORDER — SERTRALINE HCL 50 MG PO TABS
50.0000 mg | ORAL_TABLET | Freq: Every day | ORAL | 3 refills | Status: DC
Start: 2023-07-17 — End: 2023-09-28

## 2023-07-17 MED ORDER — HYDROXYZINE PAMOATE 50 MG PO CAPS: 50.0000 mg | ORAL_CAPSULE | Freq: Three times a day (TID) | ORAL | 2 refills | Status: DC | PRN

## 2023-07-17 MED ORDER — NICOTINE 7 MG/24HR TD PT24: 7.0000 mg | MEDICATED_PATCH | Freq: Every day | TRANSDERMAL | 1 refills | Status: AC

## 2023-07-17 NOTE — Assessment & Plan Note (Signed)
 She has completed Valtrex  ordered at the emergency room Still has some Left-sided facial droop including the   corner of the mouth, stated that she was told this could take months to resolve

## 2023-07-17 NOTE — Assessment & Plan Note (Signed)
 Lab Results  Component Value Date   HGBA1C 6.2 (H) 04/07/2023  Currently diet controlled Lipid panel at next visit

## 2023-07-17 NOTE — Patient Instructions (Addendum)
 1. Anxious mood (Primary)  - sertraline  (ZOLOFT ) 50 MG tablet; Take 1 tablet (50 mg total) by mouth daily.  Dispense: 30 tablet; Refill: 3 - hydrOXYzine  (VISTARIL ) 50 MG capsule; Take 1 capsule (50 mg total) by mouth 3 (three) times daily as needed.  Dispense: 30 capsule; Refill: 2 - Ambulatory referral to Psychiatry  2. OSA treated with BiPAP   3. Tobacco use  - nicotine  (NICODERM CQ  - DOSED IN MG/24 HR) 7 mg/24hr patch; Place 1 patch (7 mg total) onto the skin daily.  Dispense: 28 patch; Refill: 1     It is important that you exercise regularly at least 30 minutes 5 times a week as tolerated  Think about what you will eat, plan ahead. Choose " clean, green, fresh or frozen" over canned, processed or packaged foods which are more sugary, salty and fatty. 70 to 75% of food eaten should be vegetables and fruit. Three meals at set times with snacks allowed between meals, but they must be fruit or vegetables. Aim to eat over a 12 hour period , example 7 am to 7 pm, and STOP after  your last meal of the day. Drink water ,generally about 64 ounces per day, no other drink is as healthy. Fruit juice is best enjoyed in a healthy way, by EATING the fruit.  Thanks for choosing [facility] we consider it a privelige to serve you.

## 2023-07-17 NOTE — Progress Notes (Signed)
 Established Patient Office Visit  Subjective:  Patient ID: Michele [name], female    DOB: May 18, 1970  Age: 53 y.o. MRN: [id number]  CC: No chief complaint on file.   HPI Michele [name] is a 53 y.o. 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  Patient presents for follow-up for her chronic medical conditions.  She is accompanied by her friend of over 30 years  Chronic respiratory failure.  States that she uses 4 L of oxygen on some nights, she she is on room air during the day with oxygen saturations of around 95.  Recently had his sleep study done that was positive for sleep apnea, CPAP has been ordered by pulmonology.  States that she has been taking off of work for a year by pulmonology  Anxiety.  She has been having problem with anxiety, panic attacks since her recent hospitalization she has been out of hydroxyzine  25 mg 3 times daily tablets and Zoloft  25 mg daily tablet.  Stated that hydroxyzine  helped a little bit but Xanax worked better for her, she had gotten some Xanax from her brother, would like a prescription for Xanax.  She currently denies SI, HI          04/27/2023    9:44 AM  PHQ9 SCORE ONLY  PHQ-9 Total Score 0     Lab Results  Component Value Date   HGBA1C 6.2 (H) 04/07/2023     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   COPD (chronic obstructive pulmonary disease) (HCC)    Fibroid 04/07/2015   Medical history non-contributory    Tobacco use     Past Surgical History:  Procedure Laterality Date   BILATERAL SALPINGECTOMY  04/07/2015   Procedure: BILATERAL SALPINGECTOMY with left oophorectomy, left ovarian cystectomy and removal of pelvic  mass;  Surgeon: Heide Livings, MD;  Location: [facility];  Service: Gynecology;;   CESAREAN SECTION     WISDOM TOOTH EXTRACTION      Family History  Problem Relation Age of Onset   Cancer Mother    Cervical cancer Mother    Cancer Maternal Aunt    Cancer Maternal Uncle     Social History   Socioeconomic History   Marital status: Divorced    Spouse name: Not on file   Number of children: 2   Years of education: Not on file   Highest education level: High school graduate  Occupational History   Not on file  Tobacco Use   Smoking status: Former    Current packs/day: 0.50    Types: Cigarettes   Smokeless tobacco: Never  Vaping Use   Vaping status: Never Used  Substance and Sexual Activity   Alcohol use: Yes    Comment: occassional   Drug use: No   Sexual activity: Yes    Birth control/protection: Post-menopausal  Other Topics Concern   Not on file  Social History Narrative   Lives with her boyfriend    Social Drivers of Health   Financial Resource Strain: Not on file  Food Insecurity: Patient Unable To Answer (04/06/2023)   Hunger Vital Sign    Worried About Running Out of Food in the Last Year: Patient unable to answer    Ran Out of Food  in the Last Year: Patient unable to answer  Transportation Needs: Patient Unable To Answer (04/06/2023)   PRAPARE - Transportation    Lack of Transportation (Medical): Patient unable to answer    Lack of Transportation (Non-Medical): Patient unable to answer  Physical Activity: Not on file  Stress: Not on file  Social Connections: Not on file  Intimate Partner Violence: Patient Unable To Answer (04/06/2023)   Humiliation, Afraid, Rape, and Kick questionnaire    Fear of Current or Ex-Partner: Patient unable to answer    Emotionally Abused: Patient unable to answer    Physically Abused: Patient unable to answer    Sexually Abused: Patient unable to answer    Outpatient Medications Prior to Visit  Medication Sig Dispense Refill    acetaminophen  (TYLENOL ) 500 MG tablet Take 1 tablet (500 mg total) by mouth every 6 (six) hours as needed for mild pain (pain score 1-3). 30 tablet 1   albuterol  (VENTOLIN  HFA) 108 (90 Base) MCG/ACT inhaler Inhale 2 puffs into the lungs every 4 (four) hours as needed for wheezing or shortness of breath. 18 g 1   ipratropium-albuterol  (DUONEB) 0.5-2.5 (3) MG/3ML SOLN Take 3 mLs by nebulization every 4 (four) hours as needed. 360 mL 1   mometasone -formoterol  (DULERA ) 200-5 MCG/ACT AERO Inhale 2 puffs into the lungs 2 (two) times daily. 13 g 3   hydrOXYzine  (ATARAX ) 25 MG tablet Take 1 tablet (25 mg total) by mouth 3 (three) times daily as needed for anxiety. 30 tablet 1   nicotine  (NICODERM CQ  - DOSED IN MG/24 HR) 7 mg/24hr patch Place 1 patch (7 mg total) onto the skin daily. 28 patch 0   valACYclovir  (VALTREX ) 1000 MG tablet Take 1 tablet (1,000 mg total) by mouth 3 (three) times daily. (Patient not taking: Reported on 07/17/2023) 21 tablet 0   sertraline  (ZOLOFT ) 25 MG tablet Take 1 tablet (25 mg total) by mouth daily. (Patient not taking: Reported on 07/17/2023) 60 tablet 1   No facility-administered medications prior to visit.    No Known Allergies  ROS Review of Systems  Constitutional:  Negative for appetite change, chills, fatigue and fever.  HENT:  Negative for congestion, postnasal drip, rhinorrhea and sneezing.   Respiratory:  Negative for cough, shortness of breath and wheezing.   Cardiovascular:  Negative for chest pain, palpitations and leg swelling.  Gastrointestinal:  Negative for abdominal pain, constipation, nausea and vomiting.  Genitourinary:  Negative for difficulty urinating, dysuria, flank pain and frequency.  Musculoskeletal:  Negative for arthralgias, back pain, joint swelling and myalgias.  Skin:  Negative for color change, pallor, rash and wound.  Neurological:  Negative for dizziness, facial asymmetry, weakness, numbness and headaches.  Psychiatric/Behavioral:   Negative for behavioral problems, confusion, self-injury and suicidal ideas. The patient is nervous/anxious.       Objective:    Physical Exam Vitals and nursing note reviewed.  Constitutional:      General: She is not in acute distress.    Appearance: Normal appearance. She is obese. She is not ill-appearing, toxic-appearing or diaphoretic.  HENT:     Mouth/Throat:     Mouth: Mucous membranes are moist.     Pharynx: Oropharynx is clear. No oropharyngeal exudate or posterior oropharyngeal erythema.  Eyes:     General: No scleral icterus.       Right eye: No discharge.        Left eye: No discharge.     Extraocular Movements: Extraocular movements intact.  Conjunctiva/sclera: Conjunctivae normal.  Cardiovascular:     Rate and Rhythm: Normal rate and regular rhythm.     Pulses: Normal pulses.     Heart sounds: Normal heart sounds. No murmur heard.    No friction rub. No gallop.  Pulmonary:     Effort: Pulmonary effort is normal. No respiratory distress.     Breath sounds: Normal breath sounds. No stridor. No wheezing, rhonchi or rales.  Chest:     Chest wall: No tenderness.  Abdominal:     General: There is no distension.     Palpations: Abdomen is soft.     Tenderness: There is no abdominal tenderness. There is no right CVA tenderness, left CVA tenderness or guarding.  Musculoskeletal:        General: No swelling, tenderness, deformity or signs of injury.     Right lower leg: No edema.     Left lower leg: No edema.  Skin:    General: Skin is warm and dry.     Capillary Refill: Capillary refill takes less than 2 seconds.     Coloration: Skin is not jaundiced or pale.     Findings: No bruising, erythema or lesion.  Neurological:     Mental Status: She is alert and oriented to person, place, and time.     Motor: No weakness.     Coordination: Coordination normal.     Gait: Gait normal.     Comments: Left-sided facial droop including the corner of the mouth,   Psychiatric:        Mood and Affect: Mood normal.        Behavior: Behavior normal.        Thought Content: Thought content normal.        Judgment: Judgment normal.     BP 130/78   Pulse 95   Temp 99 F (37.2 C) (Oral)   Wt 222 lb (100.7 kg)   LMP 03/20/2009 (Within Years)   SpO2 96%   BMI 38.71 kg/m  Wt Readings from Last 3 Encounters:  07/17/23 222 lb (100.7 kg)  06/14/23 215 lb 9.6 oz (97.8 kg)  06/14/23 215 lb 9.6 oz (97.8 kg)    No results found for: "TSH" Lab Results  Component Value Date   WBC 13.5 (H) 06/08/2023   HGB 14.8 06/08/2023   HCT 45.8 06/08/2023   MCV 93.1 06/08/2023   PLT 329 06/08/2023   Lab Results  Component Value Date   NA 137 06/08/2023   K 3.6 06/08/2023   CO2 27 06/08/2023   GLUCOSE 97 06/08/2023   BUN 10 06/08/2023   CREATININE 0.73 06/08/2023   BILITOT 0.7 06/08/2023   ALKPHOS 65 06/08/2023   AST 17 06/08/2023   ALT 14 06/08/2023   PROT 8.3 (H) 06/08/2023   ALBUMIN 4.4 06/08/2023   CALCIUM 9.3 06/08/2023   ANIONGAP 11 06/08/2023   EGFR 100 03/11/2021   No results found for: "CHOL" No results found for: "HDL" No results found for: "LDLCALC" Lab Results  Component Value Date   TRIG 317 (H) 04/11/2023   No results found for: "CHOLHDL" Lab Results  Component Value Date   HGBA1C 6.2 (H) 04/07/2023      Assessment & Plan:   Problem List Items Addressed This Visit       Respiratory   OSA treated with BiPAP   Severe obstructive sleep apnea with mild oxygen desaturations noted on sleep study  Recommend CPAP therapy for severe obstructive sleep apnea, pulmonology is  arranging this      COPD (chronic obstructive pulmonary disease) (HCC)   Continue Dulera  200-5 mcg/ACT 2 puffs twice daily, albuterol  inhaler 2 puffs every 4 hours as needed, DuoNeb 3 mL every 4 hours as needed On supplemental oxygen as needed       Relevant Medications   nicotine  (NICODERM CQ  - DOSED IN MG/24 HR) 7 mg/24hr patch     Endocrine    Diabetes mellitus without complication (HCC)   Lab Results  Component Value Date   HGBA1C 6.2 (H) 04/07/2023  Currently diet controlled Lipid panel at next visit         Nervous and Auditory   Bell palsy   She has completed Valtrex  ordered at the emergency room Still has some Left-sided facial droop including the   corner of the mouth, stated that she was told this could take months to resolve      Relevant Medications   sertraline  (ZOLOFT ) 50 MG tablet   hydrOXYzine  (VISTARIL ) 50 MG capsule   nicotine  (NICODERM CQ  - DOSED IN MG/24 HR) 7 mg/24hr patch     Other   Tobacco use   she continues to avoid smoking cigarettes Nicotine  7 mg patch refilled  to help with craving       Relevant Medications   nicotine  (NICODERM CQ  - DOSED IN MG/24 HR) 7 mg/24hr patch   Anxious mood - Primary         04/27/2023    9:44 AM  GAD 7 : Generalized Anxiety Score  Nervous, Anxious, on Edge 3  Control/stop worrying 1  Worry too much - different things 3  Trouble relaxing 3  Restless 1  Easily annoyed or irritable 3  Afraid - awful might happen 3  Total GAD 7 Score 17  Anxiety Difficulty Somewhat difficult  Start Zoloft  50 mg daily, hydroxyzine  50 mg 3 times daily as needed Patient referred to psychiatrist Follow-up in 6 weeks        Relevant Medications   sertraline  (ZOLOFT ) 50 MG tablet   hydrOXYzine  (VISTARIL ) 50 MG capsule   Other Relevant Orders   Ambulatory referral to Psychiatry    Meds ordered this encounter  Medications   sertraline  (ZOLOFT ) 50 MG tablet    Sig: Take 1 tablet (50 mg total) by mouth daily.    Dispense:  30 tablet    Refill:  3   hydrOXYzine  (VISTARIL ) 50 MG capsule    Sig: Take 1 capsule (50 mg total) by mouth 3 (three) times daily as needed.    Dispense:  30 capsule    Refill:  2   nicotine  (NICODERM CQ  - DOSED IN MG/24 HR) 7 mg/24hr patch    Sig: Place 1 patch (7 mg total) onto the skin daily.    Dispense:  28 patch    Refill:  1     Follow-up: Return in about 6 weeks (around 08/28/2023) for CPE, DEPRESSION, ANXIETY.    Delma Drone R Lyan Moyano, FNP

## 2023-07-17 NOTE — Telephone Encounter (Signed)
 Patient says she has a doctor appointment at 3:05 pm and said she will be going 20 minutes early . Patient says if u can relate to primary care doctor so she can give to patient because that's where she will be  today . Patient says that is the easiest route if that is possible or she will give Ms. Tanya Fantasia a call back when ever she sees she has called

## 2023-07-17 NOTE — Assessment & Plan Note (Signed)
 Severe obstructive sleep apnea with mild oxygen desaturations noted on sleep study  Recommend CPAP therapy for severe obstructive sleep apnea, pulmonology is arranging this

## 2023-07-17 NOTE — Assessment & Plan Note (Addendum)
     04/27/2023    9:44 AM  GAD 7 : Generalized Anxiety Score  Nervous, Anxious, on Edge 3  Control/stop worrying 1  Worry too much - different things 3  Trouble relaxing 3  Restless 1  Easily annoyed or irritable 3  Afraid - awful might happen 3  Total GAD 7 Score 17  Anxiety Difficulty Somewhat difficult  Start Zoloft  50 mg daily, hydroxyzine  50 mg 3 times daily as needed Patient referred to psychiatrist Follow-up in 6 weeks

## 2023-07-17 NOTE — Assessment & Plan Note (Addendum)
 Continue Dulera  200-5 mcg/ACT 2 puffs twice daily, albuterol  inhaler 2 puffs every 4 hours as needed, DuoNeb 3 mL every 4 hours as needed On supplemental oxygen as needed

## 2023-07-17 NOTE — Telephone Encounter (Signed)
 ATC x1  was unable to leave a voicemail on patient's phone . Patient doesn't have voicemail set up . Will send patient a mychart message .

## 2023-07-17 NOTE — Telephone Encounter (Signed)
 Patient returning call for Michele [name] concerning her sleep results . Called cal and they are trying to reach [name] right now . Michele Tanya Fantasia said she will call patient back and cal told me to send clinical  [phone] patients number

## 2023-07-17 NOTE — Assessment & Plan Note (Signed)
 she continues to avoid smoking cigarettes Nicotine  7 mg patch refilled  to help with craving

## 2023-07-18 NOTE — Telephone Encounter (Signed)
 Pt states she received call from [facility] today,ok to cb

## 2023-07-19 NOTE — Telephone Encounter (Signed)
 Spoke with patient regarding sleep study      Sleep study result   Date of study: 06/24/2023   Impression: Severe obstructive sleep apnea with mild oxygen desaturations   Recommendation: DME referral   Recommend CPAP therapy for severe obstructive sleep apnea   Auto titrating CPAP with pressure settings of 14-19 will be appropriate, patient's mask of choice was a medium ResMed AirFit F10 mask   Encourage weight loss measures   Follow-up in the office 4 to 6 weeks following initiation of treatment  Order has been placed and patient does have a f/u 09/27/2023  Patient's voice was understanding.Nothing else further needed.

## 2023-08-01 ENCOUNTER — Telehealth: Payer: Self-pay | Admitting: Pulmonary Disease

## 2023-08-01 NOTE — Telephone Encounter (Signed)
 Rc'd fax from [facility] for Dr.'s signature on a DMV form. Will fwd to Dr. Gaynell Keeler for signature.

## 2023-08-08 ENCOUNTER — Telehealth: Payer: Self-pay | Admitting: Pulmonary Disease

## 2023-08-08 NOTE — Telephone Encounter (Signed)
 Cmn received from [place] for CPAP equipment.

## 2023-08-10 NOTE — Telephone Encounter (Signed)
 CMN faxed successfully and signed.

## 2023-08-15 ENCOUNTER — Telehealth: Payer: Self-pay | Admitting: *Deleted

## 2023-08-15 ENCOUNTER — Telehealth: Payer: Self-pay | Admitting: Pulmonary Disease

## 2023-08-15 NOTE — Telephone Encounter (Signed)
 Copied from CRM [id number]. Topic: Clinical - Prescription Issue >> Aug 15, 2023  8:34 AM Tyronne Galloway wrote: Reason for CRM: [facility] called for patient due to needing the new CMNPA form sent back by fax at [phone] to place an order to pt's insurance for CPAP supplies. The previous form was sent in on 5/23, however, it did not have a completed signature from Dr. Gaynell Keeler.  See message from 5/21, CMN was faxed successfully after being signed by Dr. Gaynell Keeler.  Nothing further needed.

## 2023-08-15 NOTE — Telephone Encounter (Signed)
 Cmn received from [place] for CPAP and supplies.

## 2023-08-16 NOTE — Telephone Encounter (Signed)
 Rc'd back signed CMN. Will fax to [phone]

## 2023-08-23 NOTE — Telephone Encounter (Signed)
 CMN faxed successfully and signed.

## 2023-08-28 ENCOUNTER — Ambulatory Visit: Admitting: Nurse Practitioner

## 2023-08-29 ENCOUNTER — Other Ambulatory Visit (HOSPITAL_COMMUNITY): Payer: Self-pay

## 2023-09-05 ENCOUNTER — Emergency Department (HOSPITAL_BASED_OUTPATIENT_CLINIC_OR_DEPARTMENT_OTHER): Admission: EM | Admit: 2023-09-05 | Discharge: 2023-09-05 | Disposition: A | Discharge status: Home / Self Care

## 2023-09-05 ENCOUNTER — Other Ambulatory Visit (HOSPITAL_BASED_OUTPATIENT_CLINIC_OR_DEPARTMENT_OTHER): Payer: Self-pay

## 2023-09-05 ENCOUNTER — Encounter (HOSPITAL_BASED_OUTPATIENT_CLINIC_OR_DEPARTMENT_OTHER): Payer: Self-pay | Admitting: Emergency Medicine

## 2023-09-05 ENCOUNTER — Emergency Department (HOSPITAL_BASED_OUTPATIENT_CLINIC_OR_DEPARTMENT_OTHER): Admitting: Radiology

## 2023-09-05 ENCOUNTER — Other Ambulatory Visit: Payer: Self-pay

## 2023-09-05 DIAGNOSIS — W010XXA Fall on same level from slipping, tripping and stumbling without subsequent striking against object, initial encounter: Secondary | ICD-10-CM | POA: Insufficient documentation

## 2023-09-05 DIAGNOSIS — M25561 Pain in right knee: Secondary | ICD-10-CM | POA: Diagnosis present

## 2023-09-05 DIAGNOSIS — S83412A Sprain of medial collateral ligament of left knee, initial encounter: Secondary | ICD-10-CM | POA: Insufficient documentation

## 2023-09-05 MED ORDER — KETOROLAC TROMETHAMINE 15 MG/ML IJ SOLN
15.0000 mg | Freq: Once | INTRAMUSCULAR | Status: DC
  Filled 2023-09-05: qty 1

## 2023-09-05 MED ORDER — NAPROXEN 500 MG PO TABS
500.0000 mg | ORAL_TABLET | Freq: Two times a day (BID) | ORAL | 0 refills | Status: AC
  Filled 2023-09-05: qty 14, 7d supply, fill #0

## 2023-09-05 MED ORDER — KETOROLAC TROMETHAMINE 15 MG/ML IJ SOLN
15.0000 mg | Freq: Once | INTRAMUSCULAR | Status: AC
  Administered 2023-09-05: 15 mg via INTRAMUSCULAR

## 2023-09-05 NOTE — Discharge Instructions (Signed)
 If your symptoms do not improve follow-up with your primary care doctor.  You should take the naproxen as prescribed and you can also use Tylenol  as needed for pain.  Use ice and continue to wear compression throughout the day to help with your symptoms.  Return to the ER for worsening symptoms.

## 2023-09-05 NOTE — ED Notes (Signed)
 Discharge instructions, follow up care, and pain management reviewed and explained, pt verbalized understanding.

## 2023-09-05 NOTE — ED Provider Notes (Signed)
[facility]PARTMENT AT [facility] Provider Note   CSN: [id number] Arrival date & time: 09/05/23  1155     Patient presents with: Knee Pain   Michele [name] is a 53 y.o. female.   53 year old female presents for evaluation of right knee pain. States it happened 4 days ago when she slipped and fell in the rain. States that it has gotten worse and hurts over the lateral knee. States she has used compression and been elevating the leg and using heat. Denies any pain medications or ice at home. Denies any other symptoms or concerns.    Knee Pain Associated symptoms: no back pain and no fever        Prior to Admission medications   Medication Sig Start Date End Date Taking? Authorizing Provider  naproxen (NAPROSYN) 500 MG tablet Take 1 tablet (500 mg total) by mouth 2 (two) times daily for 7 days. 09/05/23 09/12/23 Yes Dariela Stoker L, DO  acetaminophen  (TYLENOL ) 500 MG tablet Take 1 tablet (500 mg total) by mouth every 6 (six) hours as needed for mild pain (pain score 1-3). 05/14/23   Paseda, Folashade R, FNP  albuterol  (VENTOLIN  HFA) 108 (90 Base) MCG/ACT inhaler Inhale 2 puffs into the lungs every 4 (four) hours as needed for wheezing or shortness of breath. 04/27/23   Paseda, Folashade R, FNP  hydrOXYzine  (VISTARIL ) 50 MG capsule Take 1 capsule (50 mg total) by mouth 3 (three) times daily as needed. 07/17/23   Paseda, Folashade R, FNP  ipratropium-albuterol  (DUONEB) 0.5-2.5 (3) MG/3ML SOLN Take 3 mLs by nebulization every 4 (four) hours as needed. 04/27/23   Paseda, Folashade R, FNP  mometasone -formoterol  (DULERA ) 200-5 MCG/ACT AERO Inhale 2 puffs into the lungs 2 (two) times daily. 04/27/23   Paseda, Folashade R, FNP  nicotine  (NICODERM CQ  - DOSED IN MG/24 HR) 7 mg/24hr patch Place 1 patch (7 mg total) onto the skin daily. 07/17/23   Paseda, Folashade R, FNP  sertraline  (ZOLOFT ) 50 MG tablet Take 1 tablet (50 mg total) by mouth daily. 07/17/23   Paseda, Folashade R, FNP   valACYclovir  (VALTREX ) 1000 MG tablet Take 1 tablet (1,000 mg total) by mouth 3 (three) times daily. Patient not taking: Reported on 07/17/2023 06/08/23   Kingsley, Victoria K, DO    Allergies: Patient has no known allergies.    Review of Systems  Constitutional:  Negative for chills and fever.  HENT:  Negative for ear pain and sore throat.   Eyes:  Negative for pain and visual disturbance.  Respiratory:  Negative for cough and shortness of breath.   Cardiovascular:  Negative for chest pain and palpitations.  Gastrointestinal:  Negative for abdominal pain and vomiting.  Genitourinary:  Negative for dysuria and hematuria.  Musculoskeletal:  Negative for arthralgias and back pain.       Admits right knee pain  Skin:  Negative for color change and rash.  Neurological:  Negative for seizures and syncope.  All other systems reviewed and are negative.   Updated Vital Signs BP (!) 150/138   Pulse 87   Temp 98 F (36.7 C)   Resp 15   Ht 5' 3.5 (1.613 m)   Wt 100.7 kg   LMP 03/20/2009 (Within Years)   SpO2 95%   BMI 38.71 kg/m   Physical Exam Vitals and nursing note reviewed.  Constitutional:      General: She is not in acute distress.    Appearance: She is well-developed. She is not  ill-appearing.  HENT:     Head: Normocephalic and atraumatic.   Eyes:     Conjunctiva/sclera: Conjunctivae normal.    Cardiovascular:     Rate and Rhythm: Normal rate and regular rhythm.     Heart sounds: No murmur heard. Pulmonary:     Effort: Pulmonary effort is normal. No respiratory distress.     Breath sounds: Normal breath sounds.  Abdominal:     Palpations: Abdomen is soft.     Tenderness: There is no abdominal tenderness.   Musculoskeletal:        General: Swelling and tenderness present. Normal range of motion.     Cervical back: Neck supple.     Comments: Mild ttp over the medical collateral ligament with some swelling, full ROM of leg with some limitation in flexion/extension  due to pain    Skin:    General: Skin is warm and dry.     Capillary Refill: Capillary refill takes less than 2 seconds.   Neurological:     Mental Status: She is alert.   Psychiatric:        Mood and Affect: Mood normal.     (all labs ordered are listed, but only abnormal results are displayed) Labs Reviewed - No data to display  EKG: None  Radiology: No results found.   Procedures   Medications Ordered in the ED  ketorolac  (TORADOL ) 15 MG/ML injection 15 mg (15 mg Intramuscular Given 09/05/23 1334)                                    Medical Decision Making Medical Decision Making Nursing notes are reviewed. Differential diagnosis for this patient would include but not limited to: Fracture, dislocation, sprain, strain, contusion, other  Records reviewed: Office records reviewed and patient was seen on 06-14-2023 with pulmonology for emphysema    Studies: Knee x-ray reviewed independently of radiology and shows no fracture or acute abnormality  Emergency Department Course:  Vital signs and pulse oximetry are reviewed, evaluated by myself and found to be within normal limits prior to final disposition. Findings of laboratory testing and medical imaging are discussed with patient and family that is available. Patient agrees with the medical care plan as follows:  Patient's imaging as above.  She has been able to ambulate and able to move her knee very well.  I suspect knee sprain.  Will give her Toradol  here and prescription for naproxen.  Vies Tylenol  as needed as well as ice and we put a Ace wrap on her she is advised to use this as needed for pain.  Bicycle suppler primary care doctor return to the ER for any worsening symptoms.  She was comfortable to plan be discharged home.  Problems Addressed: Sprain of medial collateral ligament of left knee, initial encounter: acute illness or injury  Amount and/or Complexity of Data Reviewed External Data Reviewed:  notes. Radiology: ordered and independent interpretation performed. Decision-making details documented in ED Course.  Risk OTC drugs. Prescription drug management.    Final diagnoses:  Sprain of medial collateral ligament of left knee, initial encounter    ED Discharge Orders          Ordered    naproxen (NAPROSYN) 500 MG tablet  2 times daily        09/05/23 1407               Mariacristina Aday L, DO  09/05/23 1410  

## 2023-09-05 NOTE — ED Triage Notes (Signed)
 Pt via pov from home with right knee pain x 2 days. Pt states she slipped on a wet surface a few days earlier and doesn't know if that is associated. Pt has used compression on it and it helped. Pt alert & oriented, nad noted.

## 2023-09-06 ENCOUNTER — Telehealth: Payer: Self-pay

## 2023-09-06 NOTE — Transitions of Care (Post Inpatient/ED Visit) (Signed)
   09/06/2023  Name: LYVIA MONDESIR MRN: [id number] DOB: [birth year]  Today's TOC FU Call Status: Today's TOC FU Call Status:: Unsuccessful Call (1st Attempt) Unsuccessful Call (1st Attempt) Date: 09/06/23  Attempted to reach the patient regarding the most recent Inpatient/ED visit.  Follow Up Plan: Additional outreach attempts will be made to reach the patient to complete the Transitions of Care (Post Inpatient/ED visit) call.   Signature  [employer], [place]

## 2023-09-10 ENCOUNTER — Telehealth: Payer: Self-pay

## 2023-09-10 NOTE — Transitions of Care (Post Inpatient/ED Visit) (Signed)
   09/10/2023  Name: Michele [name] MRN: [id number] DOB: Nov 27, 1970  Today's TOC FU Call Status: Today's TOC FU Call Status:: Unsuccessful Call (2nd Attempt) Unsuccessful Call (2nd Attempt) Date: 09/10/23  Attempted to reach the patient regarding the most recent Inpatient/ED visit.  Follow Up Plan: Additional outreach attempts will be made to reach the patient to complete the Transitions of Care (Post Inpatient/ED visit) call.   Signature  [employer], [place]

## 2023-09-11 ENCOUNTER — Telehealth: Payer: Self-pay

## 2023-09-11 NOTE — Transitions of Care (Post Inpatient/ED Visit) (Signed)
   09/11/2023  Name: Michele [name] MRN: [id number] DOB: 21-Dec-1970  Today's TOC FU Call Status: Today's TOC FU Call Status:: Unsuccessful Call (3rd Attempt) Unsuccessful Call (3rd Attempt) Date: 09/11/23  Attempted to reach the patient regarding the most recent Inpatient/ED visit.  Follow Up Plan: No further outreach attempts will be made at this time. We have been unable to contact the patient.  Signature  [employer], [place]

## 2023-09-14 ENCOUNTER — Ambulatory Visit

## 2023-09-14 ENCOUNTER — Ambulatory Visit
Admission: RE | Admit: 2023-09-14 | Discharge: 2023-09-14 | Disposition: A | Discharge status: Home / Self Care | Source: Ambulatory Visit | Attending: Nurse Practitioner

## 2023-09-14 DIAGNOSIS — Z1231 Encounter for screening mammogram for malignant neoplasm of breast: Secondary | ICD-10-CM

## 2023-09-19 ENCOUNTER — Ambulatory Visit: Payer: Self-pay | Admitting: Nurse Practitioner

## 2023-09-25 ENCOUNTER — Telehealth: Payer: Self-pay | Admitting: Pulmonary Disease

## 2023-09-25 NOTE — Telephone Encounter (Signed)
 Disability paperwork was received via mail  form Disability Determination Services in [place]. Forms will be placed  in Dr.Olalere's box for review. Patient will be notified that we have received disability paperwork.

## 2023-09-27 ENCOUNTER — Ambulatory Visit: Admitting: Pulmonary Disease

## 2023-09-27 ENCOUNTER — Other Ambulatory Visit (HOSPITAL_BASED_OUTPATIENT_CLINIC_OR_DEPARTMENT_OTHER): Payer: Self-pay

## 2023-09-27 ENCOUNTER — Other Ambulatory Visit: Payer: Self-pay

## 2023-09-27 ENCOUNTER — Encounter: Admitting: Pulmonary Disease

## 2023-09-27 ENCOUNTER — Ambulatory Visit: Payer: Self-pay

## 2023-09-27 ENCOUNTER — Emergency Department (HOSPITAL_BASED_OUTPATIENT_CLINIC_OR_DEPARTMENT_OTHER)

## 2023-09-27 ENCOUNTER — Encounter (HOSPITAL_BASED_OUTPATIENT_CLINIC_OR_DEPARTMENT_OTHER): Payer: Self-pay

## 2023-09-27 ENCOUNTER — Emergency Department (HOSPITAL_BASED_OUTPATIENT_CLINIC_OR_DEPARTMENT_OTHER)
Admission: EM | Admit: 2023-09-27 | Discharge: 2023-09-27 | Disposition: A | Discharge status: Home / Self Care | Attending: Emergency Medicine | Admitting: Emergency Medicine

## 2023-09-27 DIAGNOSIS — E119 Type 2 diabetes mellitus without complications: Secondary | ICD-10-CM | POA: Insufficient documentation

## 2023-09-27 DIAGNOSIS — Z23 Encounter for immunization: Secondary | ICD-10-CM | POA: Insufficient documentation

## 2023-09-27 DIAGNOSIS — S82841A Displaced bimalleolar fracture of right lower leg, initial encounter for closed fracture: Secondary | ICD-10-CM | POA: Insufficient documentation

## 2023-09-27 DIAGNOSIS — W010XXA Fall on same level from slipping, tripping and stumbling without subsequent striking against object, initial encounter: Secondary | ICD-10-CM | POA: Insufficient documentation

## 2023-09-27 DIAGNOSIS — Z8616 Personal history of COVID-19: Secondary | ICD-10-CM | POA: Insufficient documentation

## 2023-09-27 DIAGNOSIS — F17210 Nicotine dependence, cigarettes, uncomplicated: Secondary | ICD-10-CM | POA: Diagnosis not present

## 2023-09-27 DIAGNOSIS — J449 Chronic obstructive pulmonary disease, unspecified: Secondary | ICD-10-CM | POA: Diagnosis not present

## 2023-09-27 DIAGNOSIS — S99911A Unspecified injury of right ankle, initial encounter: Secondary | ICD-10-CM | POA: Diagnosis present

## 2023-09-27 MED ORDER — CYCLOBENZAPRINE HCL 10 MG PO TABS
10.0000 mg | ORAL_TABLET | Freq: Two times a day (BID) | ORAL | 0 refills | Status: AC | PRN
  Filled 2023-09-27: qty 20, 10d supply, fill #0

## 2023-09-27 MED ORDER — TETANUS-DIPHTH-ACELL PERTUSSIS 5-2.5-18.5 LF-MCG/0.5 IM SUSY
0.5000 mL | PREFILLED_SYRINGE | Freq: Once | INTRAMUSCULAR | Status: AC
  Administered 2023-09-27: 0.5 mL via INTRAMUSCULAR
  Filled 2023-09-27: qty 0.5

## 2023-09-27 MED ORDER — HYDROCODONE-ACETAMINOPHEN 5-325 MG PO TABS
1.0000 | ORAL_TABLET | Freq: Once | ORAL | Status: AC
  Administered 2023-09-27: 1 via ORAL
  Filled 2023-09-27: qty 1

## 2023-09-27 MED ORDER — HYDROCODONE-ACETAMINOPHEN 5-325 MG PO TABS
1.0000 | ORAL_TABLET | ORAL | 0 refills | Status: AC | PRN
  Filled 2023-09-27: qty 15, 3d supply, fill #0

## 2023-09-27 NOTE — ED Triage Notes (Signed)
 Arrives POV with complaints of having a mechanical fall overnight, injuring her right ankle. Patient has notable swelling to right foot. No head injuries or LOC.

## 2023-09-27 NOTE — Telephone Encounter (Signed)
 FYI Only or Action Required?: FYI only for provider.  Patient is followed in Pulmonology for COPD, last seen on 06/14/2023 by Neda Jennet LABOR, MD.  Called Nurse Triage reporting Fall.  Symptoms began today.  Interventions attempted: Nothing.  Symptoms are: stable.  Triage Disposition: Call EMS 911 Now  Patient/caregiver understands and will follow disposition?: Yes  Copied from CRM [id number]. Topic: Clinical - Red Word Triage >> Sep 27, 2023  9:43 AM Celestine FALCON wrote: Red Word that prompted transfer to Nurse Triage: Pt stated she fell this morning and can barely walk. Pt stated she fell forward and her knee gave out and possibly twisted/sprained her ankle. Pt has an appt today at 1130 am with Dr. Neda at [facility] clinic that [facility] is working on flipping to a VV. Pt will receive a call back shortly. Reason for Disposition  [1] SEVERE weakness (e.g., unable to walk or barely able to walk, requires support) AND [2] new-onset or getting worse  Answer Assessment - Initial Assessment Questions 1. MECHANISM: How did the fall happen?     Tripped and fell  2. DOMESTIC VIOLENCE AND ELDER ABUSE SCREENING: Did you fall because someone pushed you or tried to hurt you? If Yes, ask: Are you safe now?     No  3. ONSET: When did the fall happen? (e.g., minutes, hours, or days ago)     This morning  4. LOCATION: What part of the body hit the ground? (e.g., back, buttocks, head, hips, knees, hands, head, stomach)     Unsure  5. INJURY: Did you hurt (injure) yourself when you fell? If Yes, ask: What did you injure? Tell me more about this? (e.g., body area; type of injury; pain severity)     Bruising along the ankle closer to the bottom along the foot  6. PAIN: Is there any pain? If Yes, ask: How bad is the pain? (e.g., Scale 0-10; or none, mild,      Yes, painful, moderate  7. SIZE: For cuts, bruises, or swelling, ask: How large is it? (e.g., inches or centimeters)       Bruises along the ankle and the bottom  8. PREGNANCY: Is there any chance you are pregnant? When was your last menstrual period?     No and No  9. OTHER SYMPTOMS: Do you have any other symptoms? (e.g., dizziness, fever, weakness; new-onset or worsening).      No  10. CAUSE: What do you think caused the fall (or falling)? (e.g., dizzy spell, tripped)       Unsure  Protocols used: Falls and [facility]

## 2023-09-27 NOTE — Telephone Encounter (Signed)
 FYI

## 2023-09-27 NOTE — ED Provider Notes (Signed)
 [place] EMERGENCY DEPARTMENT AT [facility] Provider Note  CSN: [id number] Arrival date & time: 09/27/23 1116  Chief Complaint(s) Fall and Ankle Pain (right)  HPI Michele [name] is a 53 y.o. female with past medical history as below, significant for chronic respiratory failure, COPD, tobacco use, sleep apnea who presents to the ED with complaint of fall  Patient reports she got up last night to go to the restroom and she tripped, her right knee gave out.  Feels that she rolled both her ankles.  Pain is most pronounced to her right ankle.  She wrapped an Ace wrap and applied a bag of cold vegetables, pain has worsened this morning, unable to put weight on her right leg.  Pain is localized to her right ankle.  She has a wound to her medial malleolus, unsure her last tetanus shot.  She also has pain to her left ankle but not nearly as bad as the pain reported to her right ankle.  No pain to her hips, no head injury LOC, no vomiting, no numbness or weakness of extremities, no incontinence, no abdominal pain.  No syncope.  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   COPD (chronic obstructive pulmonary disease) (HCC)    Fibroid 04/07/2015   Medical history non-contributory    Tobacco use    Patient Active Problem List   Diagnosis Date Noted   COPD (chronic obstructive pulmonary disease) (HCC) 07/17/2023   Bell palsy 07/17/2023   Hospital discharge follow-up 04/27/2023   Acute respiratory failure with hypoxia and hypercapnia (HCC) 04/27/2023   COPD exacerbation (HCC) 04/06/2023   OSA treated with BiPAP 03/09/2021   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 Authorizing Provider  HYDROcodone -acetaminophen  (NORCO/VICODIN) 5-325 MG tablet Take 1 tablet by mouth every 4 (four) hours as needed. 09/27/23  Yes Elnor Savant A, DO  albuterol  (VENTOLIN  HFA) 108 (90 Base) MCG/ACT inhaler Inhale 2 puffs into the lungs every 4 (four) hours as needed for wheezing or shortness of breath. 04/27/23   Paseda, Folashade R, FNP  hydrOXYzine  (VISTARIL ) 50 MG capsule Take 1 capsule (50 mg total) by mouth 3 (three) times daily as needed. 07/17/23   Paseda, Folashade R, FNP  ipratropium-albuterol  (DUONEB) 0.5-2.5 (3) MG/3ML SOLN Take 3 mLs by nebulization every 4 (four) hours as needed. 04/27/23   Paseda, Folashade R, FNP  mometasone -formoterol  (DULERA ) 200-5 MCG/ACT AERO Inhale 2 puffs into the lungs 2 (two) times daily. 04/27/23   Paseda, Folashade R, FNP  nicotine  (NICODERM CQ  - DOSED IN MG/24 HR) 7 mg/24hr patch Place 1 patch (7 mg total) onto the skin daily. 07/17/23   Paseda, Folashade R, FNP  sertraline  (ZOLOFT ) 50 MG tablet Take 1 tablet (50 mg total) by mouth daily. 07/17/23   Paseda, Folashade R, FNP  valACYclovir  (VALTREX ) 1000 MG tablet Take 1 tablet (1,000 mg total) by mouth 3 (three) times daily. Patient not taking: Reported on 07/17/2023 06/08/23   Kingsley, Victoria K, DO  Past Surgical History Past Surgical History:  Procedure Laterality Date   BILATERAL SALPINGECTOMY  04/07/2015   Procedure: BILATERAL SALPINGECTOMY with left oophorectomy, left ovarian cystectomy and removal of pelvic mass;  Surgeon: Aida DELENA Na, MD;  Location: [facility];  Service: Gynecology;;   CESAREAN SECTION     WISDOM TOOTH EXTRACTION     Family History Family History  Problem Relation Age of Onset   Cancer Mother    Cervical cancer Mother    Breast cancer Maternal Aunt 51 - 59   Cancer Maternal Aunt     Breast cancer Maternal Aunt 66 - 59   Cancer Maternal Uncle    BRCA 1/2 Neg Hx     Social History Social History   Tobacco Use   Smoking status: Some Days    Current packs/day: 0.50    Types: Cigarettes   Smokeless tobacco: Never  Vaping Use   Vaping status: Never Used  Substance Use Topics   Alcohol use: Yes    Comment: occassional   Drug use: Not Currently   Allergies Patient has no known allergies.  Review of Systems A thorough review of systems was obtained and all systems are negative except as noted in the HPI and PMH.   Physical Exam Vital Signs  I have reviewed the triage vital signs BP 120/80   Pulse (!) 56   Temp 98.4 F (36.9 C) (Oral)   Resp 17   Ht 5' 3.5 (1.613 m)   Wt 102.1 kg   LMP 03/20/2009 (Within Years)   SpO2 100%   BMI 39.23 kg/m  Physical Exam Vitals and nursing note reviewed.  Constitutional:      General: She is not in acute distress.    Appearance: Normal appearance. She is well-developed. She is obese. She is not ill-appearing.  HENT:     Head: Normocephalic and atraumatic.     Right Ear: External ear normal.     Left Ear: External ear normal.     Nose: Nose normal.     Mouth/Throat:     Mouth: Mucous membranes are moist.  Eyes:     General: No scleral icterus.       Right eye: No discharge.        Left eye: No discharge.  Cardiovascular:     Rate and Rhythm: Normal rate.  Pulmonary:     Effort: Pulmonary effort is normal. No respiratory distress.     Breath sounds: No stridor.  Abdominal:     General: Abdomen is flat. There is no distension.     Tenderness: There is no guarding.  Musculoskeletal:        General: No deformity.     Cervical back: No rigidity.       Legs:     Comments: Achilles, quadricep and patella tendon intact bilateral  LE NVI bilateral  No pain to hips bilateral  Able to lift legs off of the bed without assistance  Skin:    General: Skin is warm and dry.     Coloration: Skin is not  cyanotic, jaundiced or pale.  Neurological:     Mental Status: She is alert and oriented to person, place, and time.     GCS: GCS eye subscore is 4. GCS verbal subscore is 5. GCS motor subscore is 6.  Psychiatric:        Speech: Speech normal.        Behavior: Behavior normal. Behavior is cooperative.     ED Results  and Treatments Labs (all labs ordered are listed, but only abnormal results are displayed) Labs Reviewed - No data to display                                                                                                                        Radiology CT Ankle Right Wo Contrast Result Date: 09/27/2023 CLINICAL DATA:  Ankle trauma sustained in fall overnight. Fractures on radiographs. EXAM: CT OF THE RIGHT ANKLE WITHOUT CONTRAST TECHNIQUE: Multidetector CT imaging of the right ankle was performed according to the standard protocol. Multiplanar CT image reconstructions were also generated. RADIATION DOSE REDUCTION: This exam was performed according to the departmental dose-optimization program which includes automated exposure control, adjustment of the mA and/or kV according to patient size and/or use of iterative reconstruction technique. COMPARISON:  Same day ankle radiographs. FINDINGS: Bones/Joint/Cartilage The ankle is splinted. Oblique fracture of the distal fibular metadiaphysis is mildly comminuted with up to 5 mm of posterior displacement. Fracture extends into the distal tibiofibular joint. There is a nondisplaced intra-articular fracture involving the tibial plafond and posteriorly. This is associated with less than 2 mm of impaction of the distal articular surface. Minimal avulsion fracture of the distal tibia laterally, best seen on the coronal images. The medial malleolus is intact. There is mild widening of the ankle mortise with mild lateral tibiotalar joint space widening. There is a moderate size ankle joint effusion with probable small intra-articular fracture  fragments. No acute tarsal bone abnormalities are identified. There is a small osteochondral lesion of the talar dome medially which has sclerotic margins. Mild talonavicular degenerative changes. Ligaments Suboptimally assessed by CT. Muscles and Tendons As evaluated by CT, the ankle tendons appear intact without significant tenosynovitis or entrapment in the fractures. No focal muscular abnormalities are identified. Soft tissues Moderate soft tissue swelling around the ankle, greatest laterally. No focal fluid collection, foreign body or soft tissue emphysema. IMPRESSION: 1. Mildly displaced intra-articular fracture of the distal fibular metadiaphysis. 2. Nondisplaced intra-articular fracture of the tibial plafond posteriorly with less than 2 mm of impaction of the distal articular surface. Minimal avulsion fracture of the distal tibia laterally. 3. Mild widening of the ankle mortise with mild lateral tibiotalar joint space widening. 4. Moderate size ankle joint effusion with probable small intra-articular fracture fragments. 5. Small osteochondral lesion of the talar dome medially. 6. Intact ankle tendons. Electronically Signed   By: Elsie Perone M.D.   On: 09/27/2023 14:53   DG Knee Complete 4 Views Right Result Date: 09/27/2023 CLINICAL DATA:  Status post fall overnight. Right ankle and foot pain and swelling. EXAM: LEFT ANKLE COMPLETE - 3+ VIEW; RIGHT ANKLE - COMPLETE 3+ VIEW; RIGHT KNEE - COMPLETE 4+ VIEW COMPARISON:  Right knee radiographs 09/05/2023. Left ankle radiographs 12/01/2013. FINDINGS: Right knee: The mineralization and alignment are normal. There is no evidence of acute fracture or dislocation. The joint spaces are preserved. No evidence of joint effusion or focal soft tissue abnormality. Right ankle: Acute oblique  fracture of the distal fibular metadiaphysis with up to 4 mm of lateral displacement. This likely extends into the distal tibiofibular joint and is associated with mild widening  of the ankle mortise. Mild irregularity of the posterior aspect of the distal tibia on the lateral view could reflect a nondisplaced posterior malleolar fracture. The medial malleolus appears intact. Tiny ossific densities adjacent to the medial malleolus do not appear acute. Small bidirectional calcaneal spurs. Moderate soft tissue swelling around the ankle. Left ankle: The mineralization and alignment are normal. There is no evidence of acute fracture or dislocation. Chronic spurring of the medial malleolus. There is stable mild tibiotalar degenerative changes and small bidirectional calcaneal spurs. IMPRESSION: 1. Acute oblique fracture of the distal right fibular metadiaphysis with mild lateral displacement and probable extension into the distal tibiofibular joint. 2. Possible nondisplaced posterior malleolar fracture of the right ankle. 3. No evidence of acute fracture or dislocation in the left ankle or right knee. Electronically Signed   By: Elsie Perone M.D.   On: 09/27/2023 12:39   DG Ankle Complete Right Result Date: 09/27/2023 CLINICAL DATA:  Status post fall overnight. Right ankle and foot pain and swelling. EXAM: LEFT ANKLE COMPLETE - 3+ VIEW; RIGHT ANKLE - COMPLETE 3+ VIEW; RIGHT KNEE - COMPLETE 4+ VIEW COMPARISON:  Right knee radiographs 09/05/2023. Left ankle radiographs 12/01/2013. FINDINGS: Right knee: The mineralization and alignment are normal. There is no evidence of acute fracture or dislocation. The joint spaces are preserved. No evidence of joint effusion or focal soft tissue abnormality. Right ankle: Acute oblique fracture of the distal fibular metadiaphysis with up to 4 mm of lateral displacement. This likely extends into the distal tibiofibular joint and is associated with mild widening of the ankle mortise. Mild irregularity of the posterior aspect of the distal tibia on the lateral view could reflect a nondisplaced posterior malleolar fracture. The medial malleolus appears intact.  Tiny ossific densities adjacent to the medial malleolus do not appear acute. Small bidirectional calcaneal spurs. Moderate soft tissue swelling around the ankle. Left ankle: The mineralization and alignment are normal. There is no evidence of acute fracture or dislocation. Chronic spurring of the medial malleolus. There is stable mild tibiotalar degenerative changes and small bidirectional calcaneal spurs. IMPRESSION: 1. Acute oblique fracture of the distal right fibular metadiaphysis with mild lateral displacement and probable extension into the distal tibiofibular joint. 2. Possible nondisplaced posterior malleolar fracture of the right ankle. 3. No evidence of acute fracture or dislocation in the left ankle or right knee. Electronically Signed   By: Elsie Perone M.D.   On: 09/27/2023 12:39   DG Ankle Complete Left Result Date: 09/27/2023 CLINICAL DATA:  Status post fall overnight. Right ankle and foot pain and swelling. EXAM: LEFT ANKLE COMPLETE - 3+ VIEW; RIGHT ANKLE - COMPLETE 3+ VIEW; RIGHT KNEE - COMPLETE 4+ VIEW COMPARISON:  Right knee radiographs 09/05/2023. Left ankle radiographs 12/01/2013. FINDINGS: Right knee: The mineralization and alignment are normal. There is no evidence of acute fracture or dislocation. The joint spaces are preserved. No evidence of joint effusion or focal soft tissue abnormality. Right ankle: Acute oblique fracture of the distal fibular metadiaphysis with up to 4 mm of lateral displacement. This likely extends into the distal tibiofibular joint and is associated with mild widening of the ankle mortise. Mild irregularity of the posterior aspect of the distal tibia on the lateral view could reflect a nondisplaced posterior malleolar fracture. The medial malleolus appears intact. Tiny ossific densities adjacent to the  medial malleolus do not appear acute. Small bidirectional calcaneal spurs. Moderate soft tissue swelling around the ankle. Left ankle: The mineralization and  alignment are normal. There is no evidence of acute fracture or dislocation. Chronic spurring of the medial malleolus. There is stable mild tibiotalar degenerative changes and small bidirectional calcaneal spurs. IMPRESSION: 1. Acute oblique fracture of the distal right fibular metadiaphysis with mild lateral displacement and probable extension into the distal tibiofibular joint. 2. Possible nondisplaced posterior malleolar fracture of the right ankle. 3. No evidence of acute fracture or dislocation in the left ankle or right knee. Electronically Signed   By: Elsie Perone M.D.   On: 09/27/2023 12:39    Pertinent labs & imaging results that were available during my care of the patient were reviewed by me and considered in my medical decision making (see MDM for details).  Medications Ordered in ED Medications  Tdap (BOOSTRIX ) injection 0.5 mL (0.5 mLs Intramuscular Given 09/27/23 1139)  HYDROcodone -acetaminophen  (NORCO/VICODIN) 5-325 MG per tablet 1 tablet (1 tablet Oral Given 09/27/23 1138)  HYDROcodone -acetaminophen  (NORCO/VICODIN) 5-325 MG per tablet 1 tablet (1 tablet Oral Given 09/27/23 1449)                                                                                                                                     Procedures .Ortho Injury Treatment  Date/Time: 09/27/2023 3:08 PM  Performed by: Elnor Jayson LABOR, DO Authorized by: Elnor Jayson LABOR, DO   Consent:    Consent obtained:  Verbal   Consent given by:  Patient   Risks discussed:  Nerve damage, restricted joint movement and vascular damage   Alternatives discussed:  No treatment and alternative treatmentInjury location: ankle Location details: right ankle Injury type: fracture Fracture type: bimalleolar Pre-procedure neurovascular assessment: neurovascularly intact Pre-procedure distal perfusion: normal Pre-procedure neurological function: normal Pre-procedure range of motion: reduced  Anesthesia: Local anesthesia used:  no  Patient sedated: NoManipulation performed: no Immobilization: splint Splint type: short leg and ankle stirrup Splint Applied by: ED Tech Supplies used: aluminum splint Post-procedure neurovascular assessment: post-procedure neurovascularly intact Post-procedure distal perfusion: normal Post-procedure neurological function: normal Post-procedure range of motion: unchanged     (including critical care time)  Medical Decision Making / ED Course    Medical Decision Making:    Michele [name] is a 53 y.o. female with past medical history as below, significant for chronic respiratory failure, COPD, tobacco use, sleep apnea who presents to the ED with complaint of fall. The complaint involves an extensive differential diagnosis and also carries with it a high risk of complications and morbidity.  Serious etiology was considered. Ddx includes but is not limited to: Sprain, strain, fracture, dislocation, open fracture, ligamentous injury, etc.  Complete initial physical exam performed, notably the patient was in no acute distress, sitting on stretcher.    Reviewed and confirmed nursing documentation for past medical history, family history, social history.  Vital signs reviewed.    Fall Ankle injury > - Swelling noted to right ankle, ligaments intact, LE NVI - Get knee x-ray, bilateral ankle x-ray, provide analgesia, update tetanus - she has acute oblique fx of distal right fibular metadiaphysis and extension to tibiofibular joint (likely) and ?posterior malleolus fx, also concern for syndesmosis injury - CT reviewed, complex ankle fx - pain well controlled with oral analgesics, tolerated splint well. - f/u with Dr Barton next wk   Clinical Course as of 09/27/23 1509  Thu Sep 27, 2023  1235 XR w/ r distal fib fx, no proximal fib fx on my read  She has hopkins squeeze test +'tive, concern for syndesmosis injury as well [SG]  1355 Spoke w/ Dr Burnetta, recommend CT, f/u Dr  Barton in office  [SG]    Clinical Course User Index [SG] Elnor Savant A, DO    Patient in no distress and overall condition is stable. Detailed discussions were had with the patient/guardian regarding current findings, and need for close f/u with PCP or on call doctor. The patient/guardian has been instructed to return immediately if the symptoms worsen in any way for re-evaluation. Patient/guardian verbalized understanding and is in agreement with current care plan. All questions answered prior to discharge.                Additional history obtained: -Additional history obtained from na -External records from outside source obtained and reviewed including: Chart review including previous notes, labs, imaging, consultation notes including  Prior ER evaluation, she was seen last month with knee sprain   Lab Tests: na  EKG   EKG Interpretation Date/Time:    Ventricular Rate:    PR Interval:    QRS Duration:    QT Interval:    QTC Calculation:   R Axis:      Text Interpretation:           Imaging Studies ordered: I ordered imaging studies including knee x-ray right, ankle x-ray bilateral I independently visualized the following imaging with scope of interpretation limited to determining acute life threatening conditions related to emergency care; findings noted above I agree with the radiologist interpretation If any imaging was obtained with contrast I closely monitored patient for any possible adverse reaction a/w contrast administration in the emergency department   Medicines ordered and prescription drug management: Meds ordered this encounter  Medications   Tdap (BOOSTRIX ) injection 0.5 mL   HYDROcodone -acetaminophen  (NORCO/VICODIN) 5-325 MG per tablet 1 tablet    Refill:  0   HYDROcodone -acetaminophen  (NORCO/VICODIN) 5-325 MG tablet    Sig: Take 1 tablet by mouth every 4 (four) hours as needed.    Dispense:  15 tablet    Refill:  0    HYDROcodone -acetaminophen  (NORCO/VICODIN) 5-325 MG per tablet 1 tablet    Refill:  0    -I have reviewed the patients home medicines and have made adjustments as needed   Consultations Obtained: I requested consultation with the ortho dr brooks,  and discussed lab and imaging findings as well as pertinent plan - they recommend: ct, f/u office   Cardiac Monitoring: Continuous pulse oximetry interpreted by myself, 99% on 3L.    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    Final Clinical Impression(s) / ED Diagnoses Final diagnoses:  Closed bimalleolar fracture of right ankle, initial encounter        Elnor Jayson LABOR, DO 09/27/23 1509

## 2023-09-27 NOTE — Discharge Instructions (Addendum)
 It was a pleasure caring for you today in the emergency department.  Please call orthopedics tomorrow to arrange follow-up next week.  Please use your crutches, avoid putting weight on your right ankle if possible.  Please return to the emergency department for any worsening or worrisome symptoms.

## 2023-09-28 ENCOUNTER — Telehealth: Admitting: Physician Assistant

## 2023-09-28 DIAGNOSIS — F419 Anxiety disorder, unspecified: Secondary | ICD-10-CM | POA: Diagnosis not present

## 2023-09-28 MED ORDER — SERTRALINE HCL 50 MG PO TABS
50.0000 mg | ORAL_TABLET | Freq: Every day | ORAL | 0 refills | Status: DC
  Filled 2023-09-28: qty 30, 30d supply, fill #0

## 2023-09-28 MED ORDER — HYDROXYZINE PAMOATE 50 MG PO CAPS
50.0000 mg | ORAL_CAPSULE | Freq: Three times a day (TID) | ORAL | 0 refills | Status: DC | PRN
  Filled 2023-09-28: qty 90, 30d supply, fill #0

## 2023-09-28 NOTE — Progress Notes (Signed)
 Virtual Visit Consent   Michele [name], you are scheduled for a virtual visit with a [facility] provider today. Just as with appointments in the office, your consent must be obtained to participate. Your consent will be active for this visit and any virtual visit you may have with one of our providers in the next 365 days. If you have a MyChart account, a copy of this consent can be sent to you electronically.  As this is a virtual visit, video technology does not allow for your provider to perform a traditional examination. This may limit your provider's ability to fully assess your condition. If your provider identifies any concerns that need to be evaluated in person or the need to arrange testing (such as labs, EKG, etc.), we will make arrangements to do so. Although advances in technology are sophisticated, we cannot ensure that it will always work on either your end or our end. If the connection with a video visit is poor, the visit may have to be switched to a telephone visit. With either a video or telephone visit, we are not always able to ensure that we have a secure connection.  By engaging in this virtual visit, you consent to the provision of healthcare and authorize for your insurance to be billed (if applicable) for the services provided during this visit. Depending on your insurance coverage, you may receive a charge related to this service.  I need to obtain your verbal consent now. Are you willing to proceed with your visit today? Michele [name] has provided verbal consent on 09/28/2023 for a virtual visit (video or telephone). [name], PA-C  Date: 09/28/2023 6:01 PM   Virtual Visit via Video Note   I, [name], connected with  Michele [name]  ([id number], [birth year]) on 09/28/23 at  5:45 PM EDT by a video-enabled telemedicine application and verified that I am speaking with the correct person using two identifiers.  Location: Patient: Virtual Visit  Location Patient: Home Provider: Virtual Visit Location Provider: Home Office   I discussed the limitations of evaluation and management by telemedicine and the availability of in person appointments. The patient expressed understanding and agreed to proceed.    History of Present Illness: Michele [name] is a 53 y.o. who identifies as a female who was assigned female at birth, and is being seen today for medication refills. Patient does have an appt with her PCP on 10/22/23 and is also scheduled for a virtual visit to establish care with behavioral health on 10/04/23. However, she is currently out of her Sertraline  and Hydroxyzine  and would like a refill. She did fall yesterday morning and broke her right ankle so she has been unable to drive.   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   Diabetes mellitus without complication (HCC) 03/09/2021   COVID-19 03/04/2021   Acute respiratory failure due to COVID-19 (HCC) 03/03/2021   Tobacco use    Fibroid 04/07/2015    Allergies: No Known Allergies Medications:  Current Outpatient Medications:    albuterol  (VENTOLIN  HFA) 108 (90 Base) MCG/ACT inhaler, Inhale 2 puffs into the lungs every 4 (four) hours as needed for wheezing or  shortness of breath., Disp: 18 g, Rfl: 1   cyclobenzaprine  (FLEXERIL ) 10 MG tablet, Take 1 tablet (10 mg total) by mouth 2 (two) times daily as needed for muscle spasms., Disp: 20 tablet, Rfl: 0   HYDROcodone -acetaminophen  (NORCO/VICODIN) 5-325 MG tablet, Take 1 tablet by mouth every 4 (four) hours as needed., Disp: 15 tablet, Rfl: 0   hydrOXYzine  (VISTARIL ) 50 MG capsule, Take 1 capsule (50 mg  total) by mouth 3 (three) times daily as needed., Disp: 90 capsule, Rfl: 0   ipratropium-albuterol  (DUONEB) 0.5-2.5 (3) MG/3ML SOLN, Take 3 mLs by nebulization every 4 (four) hours as needed., Disp: 360 mL, Rfl: 1   mometasone -formoterol  (DULERA ) 200-5 MCG/ACT AERO, Inhale 2 puffs into the lungs 2 (two) times daily., Disp: 13 g, Rfl: 3   nicotine  (NICODERM CQ  - DOSED IN MG/24 HR) 7 mg/24hr patch, Place 1 patch (7 mg total) onto the skin daily., Disp: 28 patch, Rfl: 1   sertraline  (ZOLOFT ) 50 MG tablet, Take 1 tablet (50 mg total) by mouth daily., Disp: 30 tablet, Rfl: 0   valACYclovir  (VALTREX ) 1000 MG tablet, Take 1 tablet (1,000 mg total) by mouth 3 (three) times daily. (Patient not taking: Reported on 07/17/2023), Disp: 21 tablet, Rfl: 0  Observations/Objective: Patient is well-developed, well-nourished in no acute distress.  Resting comfortably at home.  Head is normocephalic, atraumatic.  No labored breathing.  Speech is clear and coherent with logical content.  Patient is alert and oriented at baseline.    Assessment and Plan: 1. Anxious mood - sertraline  (ZOLOFT ) 50 MG tablet; Take 1 tablet (50 mg total) by mouth daily.  Dispense: 30 tablet; Refill: 0 - hydrOXYzine  (VISTARIL ) 50 MG capsule; Take 1 capsule (50 mg total) by mouth 3 (three) times daily as needed.  Dispense: 90 capsule; Refill: 0  - Medications refilled at this time - Keep scheduled appts with Behavioral Health and PCP  Follow Up Instructions: I discussed the assessment and treatment plan with the patient. The patient was provided an opportunity to ask questions and all were answered. The patient agreed with the plan and demonstrated an understanding of the instructions.  A copy of instructions were sent to the patient via MyChart unless otherwise noted below.    The patient was advised to call back or seek an in-person evaluation if the symptoms worsen or if the condition fails to improve as anticipated.    [name], PA-C

## 2023-09-28 NOTE — Patient Instructions (Signed)
 Michele [name], thank you for joining Michele CHRISTELLA Dickinson, PA-C for today's virtual visit.  While this provider is not your primary care provider (PCP), if your PCP is located in our provider database this encounter information will be shared with them immediately following your visit.   A [facility] MyChart account gives you access to today's visit and all your visits, tests, and labs performed at [facility]  click here if you don't have a [facility] MyChart account or go to [email or web address]  Consent: (Patient) Michele [name] provided verbal consent for this virtual visit at the beginning of the encounter.  Current Medications:  Current Outpatient Medications:    albuterol  (VENTOLIN  HFA) 108 (90 Base) MCG/ACT inhaler, Inhale 2 puffs into the lungs every 4 (four) hours as needed for wheezing or shortness of breath., Disp: 18 g, Rfl: 1   cyclobenzaprine  (FLEXERIL ) 10 MG tablet, Take 1 tablet (10 mg total) by mouth 2 (two) times daily as needed for muscle spasms., Disp: 20 tablet, Rfl: 0   HYDROcodone -acetaminophen  (NORCO/VICODIN) 5-325 MG tablet, Take 1 tablet by mouth every 4 (four) hours as needed., Disp: 15 tablet, Rfl: 0   hydrOXYzine  (VISTARIL ) 50 MG capsule, Take 1 capsule (50 mg total) by mouth 3 (three) times daily as needed., Disp: 90 capsule, Rfl: 0   ipratropium-albuterol  (DUONEB) 0.5-2.5 (3) MG/3ML SOLN, Take 3 mLs by nebulization every 4 (four) hours as needed., Disp: 360 mL, Rfl: 1   mometasone -formoterol  (DULERA ) 200-5 MCG/ACT AERO, Inhale 2 puffs into the lungs 2 (two) times daily., Disp: 13 g, Rfl: 3   nicotine  (NICODERM CQ  - DOSED IN MG/24 HR) 7 mg/24hr patch, Place 1 patch (7 mg total) onto the skin daily., Disp: 28 patch, Rfl: 1   sertraline  (ZOLOFT ) 50 MG tablet, Take 1 tablet (50 mg total) by mouth daily., Disp: 30 tablet, Rfl: 0   valACYclovir  (VALTREX ) 1000 MG tablet, Take 1 tablet (1,000 mg total) by mouth 3 (three) times daily. (Patient not  taking: Reported on 07/17/2023), Disp: 21 tablet, Rfl: 0   Medications ordered in this encounter:  Meds ordered this encounter  Medications   sertraline  (ZOLOFT ) 50 MG tablet    Sig: Take 1 tablet (50 mg total) by mouth daily.    Dispense:  30 tablet    Refill:  0    Patient needs delivered since she has a broken ankle and can not drive    Supervising Provider:   LAMPTEY, PHILIP O [[id number]]   hydrOXYzine  (VISTARIL ) 50 MG capsule    Sig: Take 1 capsule (50 mg total) by mouth 3 (three) times daily as needed.    Dispense:  90 capsule    Refill:  0    Patient needs delivered since she has a broken ankle and can not drive    Supervising Provider:   BLAISE ALEENE KIDD [[id number]]     *If you need refills on other medications prior to your next appointment, please contact your pharmacy*  Follow-Up: Call back or seek an in-person evaluation if the symptoms worsen or if the condition fails to improve as anticipated.  [facility] [phone]  Other Instructions Managing Anxiety, Adult After being diagnosed with anxiety, you may be relieved to know why you have felt or behaved a certain way. You may also feel overwhelmed about the treatment ahead and what it will mean for your life. With care and support, you can manage your anxiety. How to manage lifestyle changes Understanding the difference between  stress and anxiety Although stress can play a role in anxiety, it is not the same as anxiety. Stress is your body's reaction to life changes and events, both good and bad. Stress is often caused by something external, such as a deadline, test, or competition. It normally goes away after the event has ended and will last just a few hours. But, stress can be ongoing and can lead to more than just stress. Anxiety is caused by something internal, such as imagining a terrible outcome or worrying that something will go wrong that will greatly upset you. Anxiety often does not go away even  after the event is over, and it can become a long-term (chronic) worry. Lowering stress and anxiety Talk with your health care provider or a counselor to learn more about lowering anxiety and stress. They may suggest tension-reduction techniques, such as: Music. Spend time creating or listening to music that you enjoy and that inspires you. Mindfulness-based meditation. Practice being aware of your normal breaths while not trying to control your breathing. It can be done while sitting or walking. Centering prayer. Focus on a word, phrase, or sacred image that means something to you and brings you peace. Deep breathing. Expand your stomach and inhale slowly through your nose. Hold your breath for 3-5 seconds. Then breathe out slowly, letting your stomach muscles relax. Self-talk. Learn to notice and spot thought patterns that lead to anxiety reactions. Change those patterns to thoughts that feel peaceful. Muscle relaxation. Take time to tense muscles and then relax them. Choose a tension-reduction technique that fits your lifestyle and personality. These techniques take time and practice. Set aside 5-15 minutes a day to do them. Specialized therapists can offer counseling and training in these techniques. The training to help with anxiety may be covered by some insurance plans. Other things you can do to manage stress and anxiety include: Keeping a stress diary. This can help you learn what triggers your reaction and then learn ways to manage your response. Thinking about how you react to certain situations. You may not be able to control everything, but you can control your response. Making time for activities that help you relax and not feeling guilty about spending your time in this way. Doing visual imagery. This involves imagining or creating mental pictures to help you relax. Practicing yoga. Through yoga poses, you can lower tension and relax.  Medicines Medicines for anxiety  include: Antidepressant medicines. These are usually prescribed for long-term daily control. Anti-anxiety medicines. These may be added in severe cases, especially when panic attacks occur. When used together, medicines, psychotherapy, and tension-reduction techniques may be the most effective treatment. Relationships Relationships can play a big part in helping you recover. Spend more time connecting with trusted friends and family members. Think about going to couples counseling if you have a partner, taking family education classes, or going to family therapy. Therapy can help you and others better understand your anxiety. How to recognize changes in your anxiety Everyone responds differently to treatment for anxiety. Recovery from anxiety happens when symptoms lessen and stop interfering with your daily life at home or work. This may mean that you will start to: Have better concentration and focus. Worry will interfere less in your daily thinking. Sleep better. Be less irritable. Have more energy. Have improved memory. Try to recognize when your condition is getting worse. Contact your provider if your symptoms interfere with home or work and you feel like your condition is not improving. Follow  these instructions at home: Activity Exercise. Adults should: Exercise for at least 150 minutes each week. The exercise should increase your heart rate and make you sweat (moderate-intensity exercise). Do strengthening exercises at least twice a week. Get the right amount and quality of sleep. Most adults need 7-9 hours of sleep each night. Lifestyle  Eat a healthy diet that includes plenty of vegetables, fruits, whole grains, low-fat dairy products, and lean protein. Do not eat a lot of foods that are high in fats, added sugars, or salt (sodium). Make choices that simplify your life. Do not use any products that contain nicotine  or tobacco. These products include cigarettes, chewing tobacco, and  vaping devices, such as e-cigarettes. If you need help quitting, ask your provider. Avoid caffeine, alcohol, and certain over-the-counter cold medicines. These may make you feel worse. Ask your pharmacist which medicines to avoid. General instructions Take over-the-counter and prescription medicines only as told by your provider. Keep all follow-up visits. This is to make sure you are managing your anxiety well or if you need more support. Where to find support You can get help and support from: Self-help groups. Online and [employer]. A trusted spiritual leader. Couples counseling. Family education classes. Family therapy. Where to find more information You may find that joining a support group helps you deal with your anxiety. The following sources can help you find counselors or support groups near you: Mental Health America: mentalhealthamerica.net Anxiety and Depression Association of [place] (ADAA): adaa.org The [employer] on Mental Illness (NAMI): nami.org Contact a health care provider if: You have a hard time staying focused or finishing tasks. You spend many hours a day feeling worried about everyday life. You are very tired because you cannot stop worrying. You start to have headaches or often feel tense. You have chronic nausea or diarrhea. Get help right away if: Your heart feels like it is racing. You have shortness of breath. You have thoughts of hurting yourself or others. Get help right away if you feel like you may hurt yourself or others, or have thoughts about taking your own life. Go to your nearest emergency room or: Call 911. Call the National Suicide Prevention Lifeline at 931 797 1381 or 988. This is open 24 hours a day. Text the Crisis Text Line at (867)090-6259. This information is not intended to replace advice given to you by your health care provider. Make sure you discuss any questions you have with your health care provider. Document  Revised: 12/13/2021 Document Reviewed: 06/27/2020 Elsevier Patient Education  2024 Elsevier Inc.   If you have been instructed to have an in-person evaluation today at a local Urgent Care facility, please use the link below. It will take you to a list of all of our available [facility], including address, phone number and hours of operation. Please do not delay care.  [facility]  If you or a family member do not have a primary care provider, use the link below to schedule a visit and establish care. When you choose a [facility] primary care physician or advanced practice provider, you gain a long-term partner in health. Find a Primary Care Provider  Learn more about [facility]'s in-office and virtual care options: [facility] - Get Care Now

## 2023-09-29 ENCOUNTER — Other Ambulatory Visit (HOSPITAL_COMMUNITY): Payer: Self-pay

## 2023-10-01 ENCOUNTER — Other Ambulatory Visit: Payer: Self-pay

## 2023-10-01 ENCOUNTER — Encounter (HOSPITAL_COMMUNITY): Payer: Self-pay

## 2023-10-01 ENCOUNTER — Telehealth: Payer: Self-pay

## 2023-10-01 ENCOUNTER — Encounter (HOSPITAL_COMMUNITY)
Admission: RE | Admit: 2023-10-01 | Discharge: 2023-10-01 | Disposition: A | Discharge status: Home / Self Care | Source: Ambulatory Visit | Attending: Orthopaedic Surgery | Admitting: Orthopaedic Surgery

## 2023-10-01 VITALS — Ht 62.5 in | Wt 225.0 lb

## 2023-10-01 DIAGNOSIS — G4733 Obstructive sleep apnea (adult) (pediatric): Secondary | ICD-10-CM

## 2023-10-01 DIAGNOSIS — E119 Type 2 diabetes mellitus without complications: Secondary | ICD-10-CM

## 2023-10-01 HISTORY — DX: Sleep apnea, unspecified: G47.30

## 2023-10-01 HISTORY — DX: Unspecified asthma, uncomplicated: J45.909

## 2023-10-01 HISTORY — DX: Depression, unspecified: F32.A

## 2023-10-01 HISTORY — DX: Dyspnea, unspecified: R06.00

## 2023-10-01 HISTORY — DX: Pneumonia, unspecified organism: J18.9

## 2023-10-01 NOTE — Progress Notes (Signed)
Pt. Needs orders for surgery. 

## 2023-10-01 NOTE — Discharge Instructions (Signed)
 Netta Cedars, MD [facility]  Please read the following information regarding your care after surgery.  Medications  You only need a prescription for the narcotic pain medicine (ex. oxycodone, Percocet, Norco).  All of the other medicines listed below are available over the counter. ? Aleve 2 pills twice a day for the first 3 days after surgery. ? acetominophen (Tylenol) 650 mg every 4-6 hours as you need for minor to moderate pain ? oxycodone as prescribed for severe pain  ? To help prevent blood clots, take aspirin (81 mg) twice daily for at least 42 days after surgery.  You should also get up every hour while you are awake to move around.  Weight Bearing ? Do NOT bear any weight on the operated leg or foot. This means do NOT touch your surgical leg to the ground!  Cast / Splint / Dressing ? If you have a splint, do NOT remove this. Keep your splint, cast or dressing clean and dry.  Don't put anything (coat hanger, pencil, etc) down inside of it.  If it gets wet, call the office immediately to schedule an appointment for a cast change.  Swelling IMPORTANT: It is normal for you to have swelling where you had surgery. To reduce swelling and pain, keep at least 3 pillows under your leg so that your toes are above your nose and your heel is above the level of your hip.  It may be necessary to keep your foot or leg elevated for several weeks.  This is critical to helping your incisions heal and your pain to feel better.  Follow Up Call my office at [phone] when you are discharged from the hospital or surgery center to schedule an appointment to be seen 7-10 days after surgery.  Call my office at [phone] if you develop a fever >101.5 F, nausea, vomiting, bleeding from the surgical site or severe pain.     Post Anesthesia Home Care Instructions  Activity: Get plenty of rest for the remainder of the day. A responsible individual must stay with you for 24 hours following the  procedure.  For the next 24 hours, DO NOT: -Drive a car -Advertising copywriter -Drink alcoholic beverages -Take any medication unless instructed by your physician -Make any legal decisions or sign important papers.  Meals: Start with liquid foods such as gelatin or soup. Progress to regular foods as tolerated. Avoid greasy, spicy, heavy foods. If nausea and/or vomiting occur, drink only clear liquids until the nausea and/or vomiting subsides. Call your physician if vomiting continues.  Special Instructions/Symptoms: Your throat may feel dry or sore from the anesthesia or the breathing tube placed in your throat during surgery. If this causes discomfort, gargle with warm salt water. The discomfort should disappear within 24 hours.  If you had a scopolamine patch placed behind your ear for the management of post- operative nausea and/or vomiting:  1. The medication in the patch is effective for 72 hours, after which it should be removed.  Wrap patch in a tissue and discard in the trash. Wash hands thoroughly with soap and water. 2. You may remove the patch earlier than 72 hours if you experience unpleasant side effects which may include dry mouth, dizziness or visual disturbances. 3. Avoid touching the patch. Wash your hands with soap and water after contact with the patch.  Regional Anesthesia Blocks  1. You may not be able to move or feel the "blocked" extremity after a regional anesthetic block. This may last may last  from 3-48 hours after placement, but it will go away. The length of time depends on the medication injected and your individual response to the medication. As the nerves start to wake up, you may experience tingling as the movement and feeling returns to your extremity. If the numbness and inability to move your extremity has not gone away after 48 hours, please call your surgeon.   2. The extremity that is blocked will need to be protected until the numbness is gone and the  strength has returned. Because you cannot feel it, you will need to take extra care to avoid injury. Because it may be weak, you may have difficulty moving it or using it. You may not know what position it is in without looking at it while the block is in effect.  3. For blocks in the legs and feet, returning to weight bearing and walking needs to be done carefully. You will need to wait until the numbness is entirely gone and the strength has returned. You should be able to move your leg and foot normally before you try and bear weight or walk. You will need someone to be with you when you first try to ensure you do not fall and possibly risk injury.  4. Bruising and tenderness at the needle site are common side effects and will resolve in a few days.  5. Persistent numbness or new problems with movement should be communicated to the surgeon or the [facility] [phone] [facility] [phone]).
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
## 2023-10-01 NOTE — H&P (Signed)
 ORTHOPAEDIC SURGERY H&P  Subjective:  The patient presents for right ankle fx.   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   Asthma    COPD (chronic obstructive pulmonary disease) (HCC)    Depression    Dyspnea    Fibroid 04/07/2015   Medical history non-contributory    Pneumonia    Sleep apnea    Tobacco use     Past Surgical History:  Procedure Laterality Date   BILATERAL SALPINGECTOMY  04/07/2015   Procedure: BILATERAL SALPINGECTOMY with left oophorectomy, left ovarian cystectomy and removal of pelvic mass;  Surgeon: Aida DELENA Na, MD;  Location: [facility];  Service: Gynecology;;   CESAREAN SECTION     WISDOM TOOTH EXTRACTION       (Not in an outpatient encounter)    No Known Allergies  Social History   Socioeconomic History   Marital status: Divorced    Spouse name: Not on file   Number of children: 2   Years of education: Not on file   Highest education level: High school graduate  Occupational History   Not on file  Tobacco Use   Smoking status: Former    Current packs/day: 0.50    Types: Cigarettes   Smokeless tobacco: Never  Vaping Use   Vaping status: Never Used  Substance and Sexual Activity   Alcohol use: Yes    Comment: occassional   Drug use: Yes    Comment: gumies TLC   Sexual activity: Yes    Birth control/protection: Post-menopausal  Other Topics Concern   Not on file  Social History Narrative   Lives with her boyfriend    Social Drivers of Corporate investment banker Strain: Not on file  Food Insecurity: Patient Unable To Answer (04/06/2023)   Hunger Vital Sign    Worried About Running Out of Food in the Last Year: Patient unable to answer    Ran Out of Food in the Last Year: Patient unable to answer  Transportation Needs: Patient Unable To Answer (04/06/2023)   PRAPARE - Transportation    Lack of Transportation (Medical): Patient  unable to answer    Lack of Transportation (Non-Medical): Patient unable to answer  Physical Activity: Not on file  Stress: Not on file  Social Connections: Not on file  Intimate Partner Violence: Patient Unable To Answer (04/06/2023)   Humiliation, Afraid, Rape, and Kick questionnaire    Fear of Current or Ex-Partner: Patient unable to answer    Emotionally Abused: Patient unable to answer    Physically Abused: Patient unable to answer    Sexually Abused: Patient unable to answer     History reviewed. No pertinent family history.   Review of Systems Pertinent items are noted in HPI.  Objective: Vital signs in last 24 hours:    10/01/2023    1:04 PM 09/27/2023    3:00 PM 09/27/2023    1:00 PM  Vitals with BMI  Height 5' 2.5    Weight 225 lbs    BMI 40.47    Systolic  120 120  Diastolic  67 80  Pulse  61 56      EXAM: General: Well nourished, well developed. Awake, alert and oriented to time, place, person. Normal mood and affect. No apparent distress. Breathing room air.  Operative Lower Extremity: Alignment - Neutral Deformity - None Skin intact Tenderness to palpation - right ankle 5/5 TA, PT, GS,  Per, EHL, FHL Sensation intact to light touch throughout Palpable DP and PT pulses Special testing: None  The contralateral foot/ankle was examined for comparison and noted to be neurovascularly intact with no localized deformity, swelling, or tenderness.  Imaging Review All images taken were independently reviewed by me.  Assessment/Plan: The clinical and radiographic findings were reviewed and discussed at length with the patient.  The patient presents for right ankle fx.  We spoke at length about the natural course of these findings. We discussed nonoperative and operative treatment options in detail.  The risks and benefits were presented and reviewed. The risks due to suture/hardware failure/irritation (or if removing hardware inability to remove part/all of  hardware, recurrent instability), new/persistent/recurrent infection, stiffness, nerve/vessel/tendon injury, nonunion/malunion of any fracture, wound healing issues, allograft usage, development of arthritis, failure of this surgery, possibility of external fixation in certain situations, possibility of delayed definitive surgery, need for further surgery, prolonged wound care including further soft tissue coverage procedures, thromboembolic events, anesthesia/medical complications/events perioperatively and beyond, amputation, death among others were discussed. The patient acknowledged the explanation and agreed to proceed with the plan.  Lillia Mountain  Orthopaedic Surgery [facility]

## 2023-10-01 NOTE — Progress Notes (Addendum)
 For Anesthesia: PCP - Paseda, Folashade R, FNP  Cardiologist - N/A Neda Jennet LABOR, MD : Pulmonologist. Bowel Prep reminder:  Chest x-ray - 04/16/23 EKG - 06/08/23 Stress Test -  ECHO -  Cardiac Cath -  Pacemaker/ICD device last checked: Pacemaker orders received: Device Rep notified:  Spinal Cord Stimulator:N/A  Sleep Study - Yes CPAP - Yes  Fasting Blood Sugar - N/A Checks Blood Sugar _____ times a day Date and result of last Hgb A1c-  Last dose of GLP1 agonist- N/A GLP1 instructions:   Last dose of SGLT-2 inhibitors- N/A SGLT-2 instructions:   Blood Thinner Instructions:N/A Aspirin  Instructions: Last Dose:  Activity level: Unable to go up a flight of stairs without shortness of breath.    Anesthesia review: Hx: Acute respiratory failure (03/2023),COPD,OSA(CPAP), 4 L Oxigen as needed.  Patient denies shortness of breath, fever, cough and chest pain at PAT appointment   Patient verbalized understanding of instructions that were reviewed over the telephone.

## 2023-10-02 ENCOUNTER — Telehealth: Payer: Self-pay

## 2023-10-02 ENCOUNTER — Encounter (HOSPITAL_COMMUNITY): Payer: Self-pay

## 2023-10-02 NOTE — Progress Notes (Signed)
 Case: [id number] Date/Time: 10/03/23 1445   Procedures:      OPEN REDUCTION INTERNAL FIXATION (ORIF) ANKLE FRACTURE (Right: Ankle)     REPAIR, SYNDESMOSIS, ANKLE (Right)     REPAIR, LIGAMENT (Right)   Anesthesia type: General   Diagnosis: Closed bimalleolar fracture of right ankle, initial encounter [S82.841A]   Pre-op diagnosis: Closed bimalleolar fracture of right ankle, initial encounter   Location: [facility] ROOM 10 / [facility]   Surgeons: Barton Drape, MD       DISCUSSION: Michele [name] is a 53 yo female who is a SDW prior to surgery above. PMH of former smoking (quit March 2025), COPD, severe OSA (with CPAP use), chronic respiratory failure, anxiety, depression, obesity (BMI 40).  Pt had a fall and broke her ankle on 7/10. Seen in ED and fracture reduced and splinted.   Admitted from 04/06/23-04/19/23 due to acute respiratory failure from the flu, CAP, and COPD exacerbation. She was discharged on home O2 and advised to f/u with pulmonology.  Follows with Pulm for chronic respiratory failure. Last seen on 06/13/33. She is on home O2. Uses 4L at night and on RA during the day. Also recently diagnosed with severe OSA and CPAP has been ordered.   Diagnosed with Bell's Palsy on 3/21. Treated with steroids and valtrex .    VS: Ht 5' 2.5 (1.588 m)   Wt 102.1 kg   LMP 03/20/2009 (Within Years)   BMI 40.50 kg/m   PROVIDERS: Paseda, Folashade R, FNP Olalere, Adewale A, MD : Pulmonologist.  LABS: Labs for DOS   IMAGES:   EKG 06/08/23:  Sinus rhythm, rate 86 Consider anterior infarct Prolonged QT interval  CV: Echo 03/04/2021:  IMPRESSIONS    1. Left ventricular ejection fraction, by estimation, is 50 to 55%. The left ventricle has low normal function. The left ventricle has no regional wall motion abnormalities. There is mild left ventricular hypertrophy.  2. Right ventricular systolic function is normal. The right ventricular size is normal. Tricuspid regurgitation  signal is inadequate for assessing PA pressure.  3. The mitral valve is grossly normal. No evidence of mitral valve regurgitation.  4. The aortic valve is grossly normal. Aortic valve regurgitation is not visualized.  5. The inferior vena cava is normal in size with greater than 50% respiratory variability, suggesting right atrial pressure of 3 mmHg. 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   Asthma    COPD (chronic obstructive pulmonary disease) (HCC)    Depression    Dyspnea    Fibroid 04/07/2015   Medical history non-contributory    Pneumonia    Sleep apnea    Tobacco use     Past Surgical History:  Procedure Laterality Date   BILATERAL SALPINGECTOMY  04/07/2015   Procedure: BILATERAL SALPINGECTOMY with left oophorectomy, left ovarian cystectomy and removal of pelvic mass;  Surgeon: Aida DELENA Na, MD;  Location: [facility];  Service: Gynecology;;   CESAREAN SECTION     WISDOM TOOTH EXTRACTION      MEDICATIONS:  albuterol  (VENTOLIN  HFA) 108 (90 Base) MCG/ACT inhaler   cyclobenzaprine  (FLEXERIL ) 10 MG tablet   HYDROcodone -acetaminophen  (NORCO/VICODIN) 5-325 MG tablet   hydrOXYzine  (VISTARIL ) 50 MG capsule   ipratropium-albuterol  (DUONEB) 0.5-2.5 (3) MG/3ML SOLN   mometasone -formoterol  (DULERA ) 200-5 MCG/ACT AERO   nicotine  (NICODERM CQ  - DOSED IN MG/24 HR) 7 mg/24hr patch   sertraline  (ZOLOFT ) 50 MG tablet   valACYclovir  (VALTREX ) 1000  MG tablet   No current facility-administered medications for this encounter.   Burnard CHRISTELLA Odis DEVONNA [facility] Short Stay/Anesthesiology [facility] Phone [phone] 10/02/2023 9:45 AM

## 2023-10-02 NOTE — Anesthesia Preprocedure Evaluation (Signed)
 Anesthesia Evaluation  Patient identified by MRN, date of birth, ID band Patient awake    Reviewed: Allergy & Precautions, H&P , NPO status , Patient's Chart, lab work & pertinent test results  Airway Mallampati: II  TM Distance: >3 FB Neck ROM: full    Dental  (+) Edentulous Upper, Edentulous Lower   Pulmonary asthma , sleep apnea and Oxygen sleep apnea , COPD, Patient abstained from smoking., former smoker   Pulmonary exam normal        Cardiovascular negative cardio ROS Normal cardiovascular exam     Neuro/Psych   Anxiety Depression     Neuromuscular disease  negative psych ROS   GI/Hepatic negative GI ROS, Neg liver ROS,,,  Endo/Other  diabetes, Type 2    Renal/GU negative Renal ROS     Musculoskeletal   Abdominal  (+) + obese  Peds  Hematology negative hematology ROS (+)   Anesthesia Other Findings   Reproductive/Obstetrics negative OB ROS                              Anesthesia Physical Anesthesia Plan  ASA: 3  Anesthesia Plan: General   Post-op Pain Management: Regional block* and Minimal or no pain anticipated   Induction: Intravenous  PONV Risk Score and Plan: 3 and Ondansetron , Dexamethasone , Midazolam  and Treatment may vary due to age or medical condition  Airway Management Planned: LMA  Additional Equipment:   Intra-op Plan:   Post-operative Plan: Extubation in OR  Informed Consent: I have reviewed the patients History and Physical, chart, labs and discussed the procedure including the risks, benefits and alternatives for the proposed anesthesia with the patient or authorized representative who has indicated his/her understanding and acceptance.     Dental Advisory Given  Plan Discussed with: CRNA and Surgeon  Anesthesia Plan Comments: (See PAT note from 7/14)         Anesthesia Quick Evaluation

## 2023-10-02 NOTE — Transitions of Care (Post Inpatient/ED Visit) (Signed)
   10/02/2023  Name: BARBI KUMAGAI MRN: [id number] DOB: December 15, 1970  Today's TOC FU Call Status: Today's TOC FU Call Status:: Unsuccessful Call (1st Attempt) Unsuccessful Call (1st Attempt) Date: 10/02/23  Attempted to reach the patient regarding the most recent Inpatient/ED visit.  Follow Up Plan: Additional outreach attempts will be made to reach the patient to complete the Transitions of Care (Post Inpatient/ED visit) call.   Signature  [employer], [place]

## 2023-10-02 NOTE — Telephone Encounter (Signed)
 Kindly schedule for follow-up in 4 weeks

## 2023-10-03 ENCOUNTER — Encounter (HOSPITAL_COMMUNITY)
Admission: RE | Disposition: A | Discharge status: Home / Self Care | Payer: Self-pay | Source: Ambulatory Visit | Attending: Orthopaedic Surgery

## 2023-10-03 ENCOUNTER — Encounter (HOSPITAL_COMMUNITY): Payer: Self-pay | Admitting: Orthopaedic Surgery

## 2023-10-03 ENCOUNTER — Encounter (HOSPITAL_COMMUNITY): Payer: Self-pay | Admitting: Physician Assistant

## 2023-10-03 ENCOUNTER — Ambulatory Visit (HOSPITAL_COMMUNITY)

## 2023-10-03 ENCOUNTER — Other Ambulatory Visit: Payer: Self-pay

## 2023-10-03 ENCOUNTER — Ambulatory Visit (HOSPITAL_COMMUNITY)
Admission: RE | Admit: 2023-10-03 | Discharge: 2023-10-03 | Disposition: A | Discharge status: Home / Self Care | Source: Ambulatory Visit | Attending: Orthopaedic Surgery | Admitting: Orthopaedic Surgery

## 2023-10-03 ENCOUNTER — Ambulatory Visit (HOSPITAL_COMMUNITY): Payer: Self-pay | Admitting: Certified Registered Nurse Anesthetist

## 2023-10-03 DIAGNOSIS — G4730 Sleep apnea, unspecified: Secondary | ICD-10-CM | POA: Diagnosis not present

## 2023-10-03 DIAGNOSIS — S82851A Displaced trimalleolar fracture of right lower leg, initial encounter for closed fracture: Secondary | ICD-10-CM

## 2023-10-03 DIAGNOSIS — E119 Type 2 diabetes mellitus without complications: Secondary | ICD-10-CM | POA: Insufficient documentation

## 2023-10-03 DIAGNOSIS — J4489 Other specified chronic obstructive pulmonary disease: Secondary | ICD-10-CM | POA: Diagnosis not present

## 2023-10-03 DIAGNOSIS — F32A Depression, unspecified: Secondary | ICD-10-CM | POA: Insufficient documentation

## 2023-10-03 DIAGNOSIS — S82841A Displaced bimalleolar fracture of right lower leg, initial encounter for closed fracture: Secondary | ICD-10-CM | POA: Diagnosis present

## 2023-10-03 DIAGNOSIS — X58XXXA Exposure to other specified factors, initial encounter: Secondary | ICD-10-CM | POA: Insufficient documentation

## 2023-10-03 DIAGNOSIS — F419 Anxiety disorder, unspecified: Secondary | ICD-10-CM | POA: Insufficient documentation

## 2023-10-03 DIAGNOSIS — Z87891 Personal history of nicotine dependence: Secondary | ICD-10-CM | POA: Insufficient documentation

## 2023-10-03 HISTORY — PX: ORIF ANKLE FRACTURE: SHX5408

## 2023-10-03 HISTORY — PX: SYNDESMOSIS REPAIR: SHX5182

## 2023-10-03 LAB — CBC
HCT: 44.8 % (ref 36.0–46.0)
Hemoglobin: 14.4 g/dL (ref 12.0–15.0)
MCH: 31.6 pg (ref 26.0–34.0)
MCHC: 32.1 g/dL (ref 30.0–36.0)
MCV: 98.5 fL (ref 80.0–100.0)
Platelets: 241 K/uL (ref 150–400)
RBC: 4.55 MIL/uL (ref 3.87–5.11)
RDW: 13.7 % (ref 11.5–15.5)
WBC: 10.9 K/uL — ABNORMAL HIGH (ref 4.0–10.5)
nRBC: 0 % (ref 0.0–0.2)

## 2023-10-03 LAB — BASIC METABOLIC PANEL WITH GFR
Anion gap: 11 (ref 5–15)
BUN: 6 mg/dL (ref 6–20)
CO2: 28 mmol/L (ref 22–32)
Calcium: 9.2 mg/dL (ref 8.9–10.3)
Chloride: 100 mmol/L (ref 98–111)
Creatinine, Ser: 0.61 mg/dL (ref 0.44–1.00)
GFR, Estimated: >60 mL/min (ref >60)
Glucose, Bld: 85 mg/dL (ref 70–99)
Potassium: 3.7 mmol/L (ref 3.5–5.1)
Sodium: 139 mmol/L (ref 135–145)

## 2023-10-03 SURGERY — OPEN REDUCTION INTERNAL FIXATION (ORIF) ANKLE FRACTURE
Anesthesia: General | Site: Ankle | Laterality: Right

## 2023-10-03 MED ORDER — PROPOFOL 10 MG/ML IV BOLUS
INTRAVENOUS | Status: DC | PRN
  Administered 2023-10-03: 200 mg via INTRAVENOUS
  Administered 2023-10-03: 100 mg via INTRAVENOUS

## 2023-10-03 MED ORDER — DOCUSATE SODIUM 100 MG PO CAPS: 100.0000 mg | ORAL_CAPSULE | Freq: Two times a day (BID) | ORAL | 0 refills | Status: AC | PRN

## 2023-10-03 MED ORDER — VANCOMYCIN HCL 1000 MG IV SOLR
INTRAVENOUS | Status: AC
  Filled 2023-10-03: qty 20

## 2023-10-03 MED ORDER — MIDAZOLAM HCL 2 MG/2ML IJ SOLN
1.0000 mg | Freq: Once | INTRAMUSCULAR | Status: AC
  Administered 2023-10-03: 2 mg via INTRAVENOUS
  Filled 2023-10-03: qty 2

## 2023-10-03 MED ORDER — ROPIVACAINE HCL 5 MG/ML IJ SOLN
INTRAMUSCULAR | Status: DC | PRN
  Administered 2023-10-03: 50 mL via PERINEURAL

## 2023-10-03 MED ORDER — OXYCODONE HCL 5 MG PO TABS: 5.0000 mg | ORAL_TABLET | Freq: Once | ORAL | Status: DC | PRN

## 2023-10-03 MED ORDER — 0.9 % SODIUM CHLORIDE (POUR BTL) OPTIME
TOPICAL | Status: DC | PRN
  Administered 2023-10-03: 1000 mL

## 2023-10-03 MED ORDER — CEFAZOLIN SODIUM-DEXTROSE 2-4 GM/100ML-% IV SOLN
2.0000 g | INTRAVENOUS | Status: AC
  Administered 2023-10-03: 2 g via INTRAVENOUS
  Filled 2023-10-03: qty 100

## 2023-10-03 MED ORDER — VANCOMYCIN HCL 1000 MG IV SOLR
INTRAVENOUS | Status: DC | PRN
Start: 2023-10-03 — End: 2023-10-03
  Administered 2023-10-03: 500 mg via TOPICAL

## 2023-10-03 MED ORDER — SODIUM CHLORIDE 0.9 % IV SOLN: 12.5000 mg | INTRAVENOUS | Status: DC | PRN

## 2023-10-03 MED ORDER — ASPIRIN 81 MG PO TBEC: 81.0000 mg | DELAYED_RELEASE_TABLET | Freq: Two times a day (BID) | ORAL | 0 refills | Status: AC

## 2023-10-03 MED ORDER — PROPOFOL 500 MG/50ML IV EMUL
INTRAVENOUS | Status: AC
  Filled 2023-10-03: qty 50

## 2023-10-03 MED ORDER — CHLORHEXIDINE GLUCONATE 0.12 % MT SOLN
15.0000 mL | Freq: Once | OROMUCOSAL | Status: AC
  Administered 2023-10-03: 15 mL via OROMUCOSAL

## 2023-10-03 MED ORDER — ORAL CARE MOUTH RINSE: 15.0000 mL | Freq: Once | OROMUCOSAL | Status: AC

## 2023-10-03 MED ORDER — DEXAMETHASONE SODIUM PHOSPHATE 10 MG/ML IJ SOLN
INTRAMUSCULAR | Status: AC
  Filled 2023-10-03: qty 1

## 2023-10-03 MED ORDER — OXYCODONE HCL 5 MG PO TABS: 5.0000 mg | ORAL_TABLET | ORAL | 0 refills | Status: AC | PRN

## 2023-10-03 MED ORDER — PHENYLEPHRINE 80 MCG/ML (10ML) SYRINGE FOR IV PUSH (FOR BLOOD PRESSURE SUPPORT)
PREFILLED_SYRINGE | INTRAVENOUS | Status: DC | PRN
Start: 2023-10-03 — End: 2023-10-03
  Administered 2023-10-03 (×2): 80 ug via INTRAVENOUS

## 2023-10-03 MED ORDER — PHENYLEPHRINE 80 MCG/ML (10ML) SYRINGE FOR IV PUSH (FOR BLOOD PRESSURE SUPPORT)
PREFILLED_SYRINGE | INTRAVENOUS | Status: AC
  Filled 2023-10-03: qty 10

## 2023-10-03 MED ORDER — OXYCODONE HCL 5 MG/5ML PO SOLN: 5.0000 mg | Freq: Once | ORAL | Status: DC | PRN

## 2023-10-03 MED ORDER — MEPERIDINE HCL 25 MG/ML IJ SOLN: 6.2500 mg | INTRAMUSCULAR | Status: DC | PRN

## 2023-10-03 MED ORDER — FENTANYL CITRATE PF 50 MCG/ML IJ SOSY
50.0000 ug | PREFILLED_SYRINGE | Freq: Once | INTRAMUSCULAR | Status: AC
  Administered 2023-10-03: 100 ug via INTRAVENOUS
  Filled 2023-10-03: qty 2

## 2023-10-03 MED ORDER — HYDROMORPHONE HCL 1 MG/ML IJ SOLN: 0.2500 mg | INTRAMUSCULAR | Status: DC | PRN

## 2023-10-03 MED ORDER — DEXAMETHASONE SODIUM PHOSPHATE 4 MG/ML IJ SOLN
INTRAMUSCULAR | Status: DC | PRN
  Administered 2023-10-03: 8 mg via INTRAVENOUS

## 2023-10-03 MED ORDER — ONDANSETRON HCL 4 MG/2ML IJ SOLN
INTRAMUSCULAR | Status: AC
  Filled 2023-10-03: qty 2

## 2023-10-03 MED ORDER — CHLORHEXIDINE GLUCONATE 4 % EX SOLN: 60.0000 mL | Freq: Once | CUTANEOUS | Status: DC

## 2023-10-03 MED ORDER — ONDANSETRON HCL 4 MG/2ML IJ SOLN
INTRAMUSCULAR | Status: DC | PRN
Start: 2023-10-03 — End: 2023-10-03
  Administered 2023-10-03: 4 mg via INTRAVENOUS

## 2023-10-03 MED ORDER — AMISULPRIDE (ANTIEMETIC) 5 MG/2ML IV SOLN: 10.0000 mg | Freq: Once | INTRAVENOUS | Status: DC | PRN

## 2023-10-03 MED ORDER — LACTATED RINGERS IV SOLN: INTRAVENOUS | Status: DC

## 2023-10-03 MED ORDER — ONDANSETRON HCL 4 MG PO TABS: 4.0000 mg | ORAL_TABLET | Freq: Three times a day (TID) | ORAL | 0 refills | Status: AC | PRN

## 2023-10-03 MED ORDER — LIDOCAINE HCL (CARDIAC) PF 100 MG/5ML IV SOSY
PREFILLED_SYRINGE | INTRAVENOUS | Status: DC | PRN
  Administered 2023-10-03: 40 mg via INTRATRACHEAL

## 2023-10-03 SURGICAL SUPPLY — 51 items
BAG COUNTER SPONGE SURGICOUNT (BAG) IMPLANT
BAG ZIPLOCK 12X15 (MISCELLANEOUS) ×2 IMPLANT
BANDAGE ESMARK 6X9 LF (GAUZE/BANDAGES/DRESSINGS) ×2 IMPLANT
BIT DRILL 2.5X2.75 QC CALB (BIT) ×2 IMPLANT
BIT DRILL 2.9 CANN QC NONSTRL (BIT) ×1 IMPLANT
BIT DRILL 2.9X70 QC CALB (BIT) ×1 IMPLANT
BLADE SURG 15 STRL LF DISP TIS (BLADE) ×4 IMPLANT
BNDG COHESIVE 6X5 TAN ST LF (GAUZE/BANDAGES/DRESSINGS) ×2 IMPLANT
BNDG ELASTIC 4INX 5YD STR LF (GAUZE/BANDAGES/DRESSINGS) ×2 IMPLANT
BNDG ELASTIC 6INX 5YD STR LF (GAUZE/BANDAGES/DRESSINGS) ×2 IMPLANT
BNDG ELASTIC 6X10 VLCR STRL LF (GAUZE/BANDAGES/DRESSINGS) ×2 IMPLANT
BNDG GAUZE DERMACEA FLUFF 4 (GAUZE/BANDAGES/DRESSINGS) ×2 IMPLANT
CHLORAPREP W/TINT 26 (MISCELLANEOUS) ×4 IMPLANT
COVER SURGICAL LIGHT HANDLE (MISCELLANEOUS) ×2 IMPLANT
CUFF TRNQT CYL 34X4.125X (TOURNIQUET CUFF) ×2 IMPLANT
DRAPE C-ARM 42X120 X-RAY (DRAPES) ×1 IMPLANT
DRAPE C-ARMOR (DRAPES) ×2 IMPLANT
DRAPE EXTREMITY T 121X128X90 (DISPOSABLE) ×2 IMPLANT
DRAPE U-SHAPE 47X51 STRL (DRAPES) ×2 IMPLANT
DRIVER RETENTION T15 LONG (ORTHOPEDIC DISPOSABLE SUPPLIES) ×2 IMPLANT
DRSG MEPITEL 8X12 (GAUZE/BANDAGES/DRESSINGS) ×1 IMPLANT
ELECT REM PT RETURN 15FT ADLT (MISCELLANEOUS) ×2 IMPLANT
GAUZE 4X4 16PLY ~~LOC~~+RFID DBL (SPONGE) ×2 IMPLANT
GAUZE PAD ABD 8X10 STRL (GAUZE/BANDAGES/DRESSINGS) ×10 IMPLANT
GAUZE SPONGE 4X4 12PLY STRL (GAUZE/BANDAGES/DRESSINGS) ×4 IMPLANT
GAUZE XEROFORM 1X8 LF (GAUZE/BANDAGES/DRESSINGS) ×2 IMPLANT
GLOVE BIOGEL PI IND STRL 7.5 (GLOVE) ×4 IMPLANT
GLOVE INDICATOR 7.5 STRL GRN (GLOVE) ×2 IMPLANT
GOWN STRL REUS W/ TWL LRG LVL3 (GOWN DISPOSABLE) ×2 IMPLANT
KIT BASIN OR (CUSTOM PROCEDURE TRAY) ×2 IMPLANT
KIT TURNOVER KIT A (KITS) ×2 IMPLANT
KWIRE ALPS MXV 1.6X6 ZI (WIRE) ×2 IMPLANT
NS IRRIG 1000ML POUR BTL (IV SOLUTION) ×2 IMPLANT
PACK TOTAL JOINT (CUSTOM PROCEDURE TRAY) ×2 IMPLANT
PADDING CAST COTTON 6X4 STRL (CAST SUPPLIES) ×4 IMPLANT
PLATE 1/3 TUBULAR 6H (Plate) ×1 IMPLANT
PROTECTOR NERVE ULNAR (MISCELLANEOUS) ×2 IMPLANT
SCREW ACE CAN 4.0 38M (Screw) ×1 IMPLANT
SCREW LOCK MDS 3.5X12 (Screw) ×3 IMPLANT
SCREW NLOCK CANC HEX 4X50 (Screw) ×1 IMPLANT
SCREW NLOCK CANC HEX 4X55 (Screw) ×1 IMPLANT
SCREW NON-LOCK 3.5X12 (Screw) ×1 IMPLANT
SPLINT FIBERGLASS 4X30 (CAST SUPPLIES) ×2 IMPLANT
STOCKINETTE 4X48 STRL (DRAPES) ×2 IMPLANT
STRAP ANKLE DISTRACTOR (MISCELLANEOUS) ×2 IMPLANT
SUCTION TUBE FRAZIER 10FR DISP (SUCTIONS) ×2 IMPLANT
SUT ETHILON 3 0 PS 1 (SUTURE) ×4 IMPLANT
SUT VIC AB 0 CT1 36 (SUTURE) ×2 IMPLANT
SUT VIC AB 2-0 SH 27XBRD (SUTURE) ×2 IMPLANT
SUT VIC AB 3-0 SH 27X BRD (SUTURE) ×4 IMPLANT
WATER STERILE IRR 1000ML POUR (IV SOLUTION) ×2 IMPLANT

## 2023-10-03 NOTE — Anesthesia Procedure Notes (Signed)
 Anesthesia Regional Block: Popliteal block   Pre-Anesthetic Checklist: , timeout performed,  Correct Patient, Correct Site, Correct Laterality,  Correct Procedure, Correct Position, site marked,  Risks and benefits discussed,  Surgical consent,  Pre-op evaluation,  At surgeon's request and post-op pain management  Laterality: Right  Prep: chloraprep       Needles:  Injection technique: Single-shot  Needle Type: Stimiplex     Needle Length: 9cm  Needle Gauge: 21     Additional Needles:   Procedures:,,,, ultrasound used (permanent image in chart),,    Narrative:  Start time: 10/03/2023 2:33 PM End time: 10/03/2023 2:38 PM Injection made incrementally with aspirations every 5 mL.  Performed by: Personally  Anesthesiologist: Cleotilde Butler Dade, MD

## 2023-10-03 NOTE — Anesthesia Procedure Notes (Signed)
 Anesthesia Regional Block: Adductor canal block   Pre-Anesthetic Checklist: , timeout performed,  Correct Patient, Correct Site, Correct Laterality,  Correct Procedure, Correct Position, site marked,  Risks and benefits discussed,  Surgical consent,  Pre-op evaluation,  At surgeon's request and post-op pain management  Laterality: Right  Prep: chloraprep       Needles:  Injection technique: Single-shot  Needle Type: Stimiplex     Needle Length: 9cm  Needle Gauge: 21     Additional Needles:   Procedures:,,,, ultrasound used (permanent image in chart),,    Narrative:  Start time: 10/03/2023 2:32 PM End time: 10/03/2023 2:37 PM Injection made incrementally with aspirations every 5 mL.  Performed by: Personally  Anesthesiologist: Cleotilde Butler Dade, MD

## 2023-10-03 NOTE — Op Note (Signed)
 10/03/2023  6:13 PM   PATIENT: Michele [name]  53 y.o. female  MRN: [id number]   PRE-OPERATIVE DIAGNOSIS:   Closed bimalleolar fracture of right ankle, initial encounter   POST-OPERATIVE DIAGNOSIS:   Closed bimalleolar fracture of right ankle, initial encounter   PROCEDURE: 1] Right bimalleolar (lateral and posterior) ankle open reduction internal fixation 2] Right ankle syndesmosis open reduction internal fixation   SURGEON:  Lillia Mountain, MD   ASSISTANT: None   ANESTHESIA: General, regional   EBL: Minimal   TOURNIQUET:    Total Tourniquet Time Documented: Thigh (Right) - 167 minutes Total: Thigh (Right) - 167 minutes    COMPLICATIONS: None apparent   DISPOSITION: Extubated, awake and stable to recovery.   INDICATION FOR PROCEDURE: The patient presented with above diagnosis.  We discussed the diagnosis, alternative treatment options, risks and benefits of the above surgical intervention, as well as alternative non-operative treatments. All questions/concerns were addressed and the patient/family demonstrated appropriate understanding of the diagnosis, the procedure, the postoperative course, and overall prognosis. The patient wished to proceed with surgical intervention and signed an informed surgical consent as such, in each others presence prior to surgery.   PROCEDURE IN DETAIL: After preoperative consent was obtained and the correct operative site was identified, the patient was brought to the operating room supine on stretcher and transferred onto operating table. General anesthesia was induced. Preoperative antibiotics were administered. Surgical timeout was taken. The patient was then positioned supine with an ipsilateral hip bump. The operative lower extremity was prepped and draped in standard sterile fashion with a tourniquet around the thigh. The extremity was exsanguinated and the tourniquet was inflated to 275 mmHg.  A standard lateral  incision was made over the distal fibula. Dissection was carried down to the level of the fibula and the fracture site identified. The superficial peroneal nerve was identified and protected throughout the procedure. The fibula was noted to be shortened with interposed periosteum. The fibula was brought out to length. The fibula fracture was debrided and the edges defined to achieve cortical read. Reduction maneuver was performed using pointed reduction forceps and lobster forceps. In this manner, the fibula length was restored and fracture reduced. A lag screw was not placed given the orientation of fracture lines and comminution. Due to poor bone quality and extensive comminution at the fracture site, it was decided to use a locking distal fibula plate. We then selected a Zimmer locking plate to match the anatomy of the distal fibula and placed it laterally. This was implanted under intraoperative fluoroscopy with a combination of distal locking screws and proximal cortical & locking screws.  We then proceeded with reduction and fixation of the posterior distal tibia fragment. We confirmed appropriate reduction using intraoperative fluoroscopy. The posterior distal tibia fragment was reduced directly using window thru the fibula fracture. An anterolateral approach was made over anterior ankle. Dissection carried down to level of distal tibia taking care throughout to protect the nearby tendons and neurovascular structures. A Kirschner wire was used to provisionally fix the fracture. This was overdrilled with cannulated drill and a 4.0 partially threaded screw was implanted. This was noted to achieve compression across the fracture with excellent congruency of the tibial articular surface on fluoroscopy. Position of this screw was verified carefully using intraoperative fluoroscopy throughout.    A manual external rotation stress radiograph was obtained and demonstrated widening of the ankle mortise. Given this  intraoperative finding as well as preoperative subluxation, it was decided to reduce and  fix the syndesmosis. Therefore a nonlocking quadricortical hex head 4.0 mm screw was implanted through the fibula plate in appropriate fashion to fix the syndesmosis. Screw position was verified along anteromedial tibial cortex by fluoroscopy. A repeat stress radiograph showed complete stability of the ankle mortise to testing. Another screw was placed in similar fashion to further reinforce fixation.  The surgical sites were thoroughly irrigated. The tourniquet was deflated and hemostasis achieved. Betadine and vancomycin  powder were applied. The deep layers were closed using 2-0 vicryl. The skin was closed without tension.    The leg was cleaned with saline and sterile dressings with gauze were applied. A well padded bulky short leg splint was applied. The patient was awakened from anesthesia and transported to the recovery room in stable condition.    FOLLOW UP PLAN: -transfer to PACU, then home -strict NWB operative extremity, maximum elevation -maintain short leg splint until follow up -DVT ppx: Aspirin  81 mg twice daily while NWB -follow up as outpatient within 7-10 days for wound check with exchange of short leg splint to short leg cast -sutures out in 2-3 weeks in outpatient office   RADIOGRAPHS: AP, lateral, oblique and stress radiographs of the right ankle were obtained intraoperatively. These showed interval reduction and fixation of the fractures. Manual stress radiographs were taken and the joints were noted to be stable following fixation. All hardware is appropriately positioned and of the appropriate lengths. No other acute injuries are noted.   Lillia Mountain Orthopaedic Surgery [facility]

## 2023-10-03 NOTE — Progress Notes (Signed)
 Reviewed sleep study again  1 L of oxygen was added to her therapy during diagnostic study and therapeutic study  Will request for order to be placed for CPAP with 1 L of oxygen bled into system

## 2023-10-03 NOTE — Transfer of Care (Signed)
 Immediate Anesthesia Transfer of Care Note  Patient: Michele [name]  Procedure(s) Performed: OPEN REDUCTION INTERNAL FIXATION (ORIF) ANKLE FRACTURE (Right: Ankle) REPAIR, SYNDESMOSIS, ANKLE (Right: Ankle)  Patient Location: PACU  Anesthesia Type:General  Level of Consciousness: drowsy  Airway & Oxygen Therapy: Patient Spontanous Breathing and Patient connected to face mask oxygen  Post-op Assessment: Report given to RN and Post -op Vital signs reviewed and stable  Post vital signs: Reviewed and stable  Last Vitals:  Vitals Value Taken Time  BP 132/80 10/03/23 18:10  Temp    Pulse 64 10/03/23 18:12  Resp 19 10/03/23 18:12  SpO2 89 % 10/03/23 18:12  Vitals shown include unfiled device data.  Last Pain:  Vitals:   10/03/23 1430  TempSrc:   PainSc: 0-No pain         Complications: No notable events documented.

## 2023-10-03 NOTE — Telephone Encounter (Signed)
 Pt states they did put on oxygen while she was on CPAP during sleep study please advise

## 2023-10-03 NOTE — Anesthesia Postprocedure Evaluation (Signed)
 Anesthesia Post Note  Patient: GLENNDA WEATHERHOLTZ  Procedure(s) Performed: OPEN REDUCTION INTERNAL FIXATION (ORIF) ANKLE FRACTURE (Right: Ankle) REPAIR, SYNDESMOSIS, ANKLE (Right: Ankle)     Patient location during evaluation: PACU Anesthesia Type: General Level of consciousness: awake and alert Pain management: pain level controlled Vital Signs Assessment: post-procedure vital signs reviewed and stable Respiratory status: spontaneous breathing, nonlabored ventilation and respiratory function stable Cardiovascular status: blood pressure returned to baseline and stable Postop Assessment: no apparent nausea or vomiting Anesthetic complications: no   No notable events documented.  Last Vitals:  Vitals:   10/03/23 1845 10/03/23 1900  BP: 119/79 135/81  Pulse: 72 66  Resp: 15 20  Temp:    SpO2: 100% 93%    Last Pain:  Vitals:   10/03/23 1900  TempSrc:   PainSc: 0-No pain                 Butler Levander [place]

## 2023-10-03 NOTE — Telephone Encounter (Signed)
 Consuelo,  Place a DME referral and note the settings and add 1L bled in to the settings section of the order.

## 2023-10-03 NOTE — H&P (Signed)

## 2023-10-03 NOTE — Telephone Encounter (Signed)
 I do not know I am sorry

## 2023-10-03 NOTE — Anesthesia Procedure Notes (Addendum)
 Procedure Name: LMA Insertion Date/Time: 10/03/2023 4:49 PM  Performed by: Landy Chip HERO, CRNAPre-anesthesia Checklist: Patient identified, Emergency Drugs available, Suction available and Patient being monitored Patient Re-evaluated:Patient Re-evaluated prior to induction Oxygen Delivery Method: Circle System Utilized Preoxygenation: Pre-oxygenation with 100% oxygen Induction Type: IV induction Ventilation: Mask ventilation without difficulty LMA: LMA with gastric port inserted LMA Size: 4.0 Number of attempts: 1 Airway Equipment and Method: Bite block Placement Confirmation: positive ETCO2 Tube secured with: Tape Dental Injury: Teeth and Oropharynx as per pre-operative assessment

## 2023-10-04 ENCOUNTER — Ambulatory Visit (HOSPITAL_COMMUNITY): Admitting: Registered Nurse

## 2023-10-04 ENCOUNTER — Encounter (HOSPITAL_COMMUNITY): Payer: Self-pay | Admitting: Registered Nurse

## 2023-10-04 DIAGNOSIS — F332 Major depressive disorder, recurrent severe without psychotic features: Secondary | ICD-10-CM | POA: Diagnosis not present

## 2023-10-04 DIAGNOSIS — G4700 Insomnia, unspecified: Secondary | ICD-10-CM | POA: Diagnosis not present

## 2023-10-04 DIAGNOSIS — F411 Generalized anxiety disorder: Secondary | ICD-10-CM | POA: Diagnosis not present

## 2023-10-04 LAB — HEMOGLOBIN A1C
Hgb A1c MFr Bld: 5.9 % — ABNORMAL HIGH (ref 4.8–5.6)
Mean Plasma Glucose: 123 mg/dL

## 2023-10-04 MED ORDER — HYDROXYZINE PAMOATE 25 MG PO CAPS: 50.0000 mg | ORAL_CAPSULE | Freq: Three times a day (TID) | ORAL | 1 refills | Status: DC | PRN

## 2023-10-04 MED ORDER — MIRTAZAPINE 7.5 MG PO TABS: 7.5000 mg | ORAL_TABLET | Freq: Every day | ORAL | 1 refills | Status: DC

## 2023-10-04 MED ORDER — BUSPIRONE HCL 7.5 MG PO TABS: 7.5000 mg | ORAL_TABLET | Freq: Two times a day (BID) | ORAL | 1 refills | Status: DC

## 2023-10-04 MED ORDER — SERTRALINE HCL 25 MG PO TABS: 25.0000 mg | ORAL_TABLET | Freq: Every day | ORAL | 1 refills | Status: DC

## 2023-10-04 MED ORDER — SERTRALINE HCL 50 MG PO TABS: 50.0000 mg | ORAL_TABLET | Freq: Every day | ORAL | 1 refills | Status: DC
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
## 2023-10-04 NOTE — Telephone Encounter (Signed)
 I have placed the order

## 2023-10-04 NOTE — Patient Instructions (Addendum)
 Mental Health Medication List  Zoloft :  Take 25 mg tablet with 50 mg tablet daily to total 75 mg a day.  Buspar  7.5 mg 2 times a day  Remeron  7.5 mg take daily at bed time  Vistaril  25 mg take three times daily as needed for anxiety   Call 911, [phone], mobile crisis, or present to the nearest emergency room should you experience any suicidal/homicidal ideation, auditory/visual/hallucinations, or detrimental worsening of your mental health.  Mobile Crisis Response Teams Listed by counties in vicinity of [facility] providers [facility] [phone] [facility] [phone] [facility] [phone] [facility] [phone] [place]                * [facility] [phone]                * [facility] [phone]  [facility] [phone] [facility] [employer]  [phone] * [employer] [phone]

## 2023-10-05 ENCOUNTER — Telehealth: Payer: Self-pay

## 2023-10-05 NOTE — Transitions of Care (Post Inpatient/ED Visit) (Signed)
   10/05/2023  Name: Michele [name] MRN: [id number] DOB: 1971-03-05  Today's TOC FU Call Status: Unsuccessful Call (2nd Attempt) Date: 10/05/23  Attempted to reach the patient regarding the most recent Inpatient/ED visit.  Follow Up Plan: Additional outreach attempts will be made to reach the patient to complete the Transitions of Care (Post Inpatient/ED visit) call.   Signature  [employer], [place]

## 2023-10-09 ENCOUNTER — Encounter (HOSPITAL_COMMUNITY): Payer: Self-pay | Admitting: Orthopaedic Surgery

## 2023-10-09 ENCOUNTER — Telehealth: Payer: Self-pay

## 2023-10-09 NOTE — Transitions of Care (Post Inpatient/ED Visit) (Signed)
   10/09/2023  Name: Michele [name] MRN: [id number] DOB: 1970-06-04  Today's TOC FU Call Status: Unsuccessful Call (3rd Attempt) Date: 10/09/23  Attempted to reach the patient regarding the most recent Inpatient/ED visit.  Follow Up Plan: No further outreach attempts will be made at this time. We have been unable to contact the patient.  Signature  [employer], [place]

## 2023-10-22 ENCOUNTER — Ambulatory Visit: Payer: Self-pay | Admitting: Nurse Practitioner

## 2023-10-24 ENCOUNTER — Other Ambulatory Visit (HOSPITAL_COMMUNITY): Payer: Self-pay

## 2023-10-24 ENCOUNTER — Other Ambulatory Visit: Payer: Self-pay | Admitting: Nurse Practitioner

## 2023-10-24 DIAGNOSIS — J9601 Acute respiratory failure with hypoxia: Secondary | ICD-10-CM

## 2023-10-24 DIAGNOSIS — J441 Chronic obstructive pulmonary disease with (acute) exacerbation: Secondary | ICD-10-CM

## 2023-10-24 MED ORDER — DULERA 200-5 MCG/ACT IN AERO
2.0000 | INHALATION_SPRAY | Freq: Two times a day (BID) | RESPIRATORY_TRACT | 3 refills | Status: AC
  Filled 2023-10-24: qty 13, 30d supply, fill #0
  Filled 2024-04-02: qty 13, 30d supply, fill #1

## 2023-10-24 NOTE — Progress Notes (Signed)
 Error

## 2023-11-22 ENCOUNTER — Telehealth (HOSPITAL_COMMUNITY): Payer: Self-pay | Admitting: *Deleted

## 2023-11-22 NOTE — Telephone Encounter (Addendum)
 Per pt, the medications that provider put her on is not working. Per pt she is seeing things/people that she knows that are real and is very anxious and just want to be by herself. Per pt she just want to see if provider could put her on light Valium or Klonopin or something for her anxiety because it's really bad. Per pt and want to see if provider could put her on another medication to help with her not seeing things that's making it seem like its real.   Patient first and only appt with office was July 2025 and did not make a f/u appt. Per pt she stopped taking the Lorazepam, Sertraline  and Hydroxyzine  because it's not doing no good. Per pt, she stopped taking all three of those medications about 3 days ago. Per pt since she stopped she's been not seeing people that are not real. Per patient she do not want to be seeing people that's not there. Per pt when she stopped taking those medications, she stopped seeing people and felt better about not seeing people    Per pt, now she just need one script that can take the edge off. Per pt she's not the person that takes medications and don't need medications she don't need. Per pt she don't understand why they would even start her with all of this mess and all of these medications.  Per pt she took Xanax PRN before and it helped her. Staff Informed patient that provider most of the time do not prescribe Xanax. Per pt what about Klonopin then? Staff informed patient would have to ask provider. Per pt if provider can't provide this to her then she have to find her another provider because no one wants to help her. Per pt her PCP referred her to this office because they informed her that this provider could continue filling her medications for her Anxiety.   Per pt, she is on oxygen and need to keep her stress level down and just need something to take the edge off. Per pt she is not a medication person. She feels like she do not need to be on all of this  medications. Per pt that's how people get addicted to all these medications. Per pt she feel like the pharmaceutical companies are screwing her over and noone is trying to help her.  Informed patient that message will be sent to provider and we will call her back.      Patient agreed and verbalized understanding.   Please advise.   Patient next visit is September 18th.

## 2023-11-23 NOTE — Telephone Encounter (Signed)
 LMOM to inform patient with provider's response. Staff was unable to reach patient and staff lmom and office number was provided on voicemail

## 2023-11-28 ENCOUNTER — Ambulatory Visit: Payer: Self-pay | Admitting: Nurse Practitioner

## 2023-12-06 ENCOUNTER — Telehealth (INDEPENDENT_AMBULATORY_CARE_PROVIDER_SITE_OTHER): Admitting: Registered Nurse

## 2023-12-06 ENCOUNTER — Encounter (HOSPITAL_COMMUNITY): Payer: Self-pay | Admitting: Registered Nurse

## 2023-12-06 DIAGNOSIS — F411 Generalized anxiety disorder: Secondary | ICD-10-CM | POA: Diagnosis not present

## 2023-12-06 DIAGNOSIS — G4700 Insomnia, unspecified: Secondary | ICD-10-CM | POA: Diagnosis not present

## 2023-12-06 DIAGNOSIS — F332 Major depressive disorder, recurrent severe without psychotic features: Secondary | ICD-10-CM | POA: Diagnosis not present

## 2023-12-06 MED ORDER — BUSPIRONE HCL 10 MG PO TABS: 10.0000 mg | ORAL_TABLET | Freq: Two times a day (BID) | ORAL | 1 refills | Status: DC

## 2023-12-06 MED ORDER — HYDROXYZINE PAMOATE 25 MG PO CAPS: 50.0000 mg | ORAL_CAPSULE | Freq: Three times a day (TID) | ORAL | 1 refills | Status: AC | PRN

## 2023-12-06 MED ORDER — FLUOXETINE HCL 20 MG PO CAPS: 20.0000 mg | ORAL_CAPSULE | Freq: Every day | ORAL | 1 refills | Status: DC

## 2023-12-06 MED ORDER — MIRTAZAPINE 7.5 MG PO TABS: 7.5000 mg | ORAL_TABLET | Freq: Every day | ORAL | 1 refills | Status: DC
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
## 2023-12-06 NOTE — Patient Instructions (Signed)

## 2024-02-29 ENCOUNTER — Telehealth: Payer: Self-pay

## 2024-02-29 NOTE — Telephone Encounter (Signed)
 LVM to schedule CPE/Physcial. First available in April.

## 2024-04-02 ENCOUNTER — Ambulatory Visit (INDEPENDENT_AMBULATORY_CARE_PROVIDER_SITE_OTHER): Payer: Self-pay | Admitting: Nurse Practitioner

## 2024-04-02 ENCOUNTER — Other Ambulatory Visit (HOSPITAL_COMMUNITY): Payer: Self-pay

## 2024-04-02 ENCOUNTER — Encounter: Payer: Self-pay | Admitting: Nurse Practitioner

## 2024-04-02 VITALS — BP 103/70 | HR 76 | Wt 256.0 lb

## 2024-04-02 DIAGNOSIS — F332 Major depressive disorder, recurrent severe without psychotic features: Secondary | ICD-10-CM | POA: Diagnosis not present

## 2024-04-02 DIAGNOSIS — E119 Type 2 diabetes mellitus without complications: Secondary | ICD-10-CM

## 2024-04-02 DIAGNOSIS — J449 Chronic obstructive pulmonary disease, unspecified: Secondary | ICD-10-CM | POA: Diagnosis not present

## 2024-04-02 DIAGNOSIS — F419 Anxiety disorder, unspecified: Secondary | ICD-10-CM | POA: Diagnosis not present

## 2024-04-02 DIAGNOSIS — Z124 Encounter for screening for malignant neoplasm of cervix: Secondary | ICD-10-CM

## 2024-04-02 DIAGNOSIS — Z5941 Food insecurity: Secondary | ICD-10-CM | POA: Insufficient documentation

## 2024-04-02 DIAGNOSIS — S82841K Displaced bimalleolar fracture of right lower leg, subsequent encounter for closed fracture with nonunion: Secondary | ICD-10-CM

## 2024-04-02 DIAGNOSIS — Z5912 Inadequate housing utilities: Secondary | ICD-10-CM | POA: Insufficient documentation

## 2024-04-02 DIAGNOSIS — Z6841 Body Mass Index (BMI) 40.0 and over, adult: Secondary | ICD-10-CM

## 2024-04-02 DIAGNOSIS — Z0000 Encounter for general adult medical examination without abnormal findings: Secondary | ICD-10-CM | POA: Diagnosis not present

## 2024-04-02 DIAGNOSIS — Z13220 Encounter for screening for lipoid disorders: Secondary | ICD-10-CM | POA: Diagnosis not present

## 2024-04-02 DIAGNOSIS — G4733 Obstructive sleep apnea (adult) (pediatric): Secondary | ICD-10-CM

## 2024-04-02 DIAGNOSIS — S82841A Displaced bimalleolar fracture of right lower leg, initial encounter for closed fracture: Secondary | ICD-10-CM | POA: Insufficient documentation

## 2024-04-02 LAB — POCT GLYCOSYLATED HEMOGLOBIN (HGB A1C): Hemoglobin A1C: 7.1 % — AB (ref 4.0–5.6)

## 2024-04-02 MED ORDER — ZEPBOUND 2.5 MG/0.5ML ~~LOC~~ SOAJ: 2.5000 mg | SUBCUTANEOUS | 0 refills | Status: AC

## 2024-04-02 MED ORDER — LANCETS MISC: 1.0000 | 0 refills | Status: AC

## 2024-04-02 MED ORDER — BLOOD GLUCOSE MONITORING SUPPL DEVI: 1.0000 | Freq: Three times a day (TID) | 0 refills | Status: AC

## 2024-04-02 MED ORDER — LANCET DEVICE MISC: 1.0000 | Freq: Three times a day (TID) | 0 refills | Status: AC

## 2024-04-02 MED ORDER — TRULICITY 0.75 MG/0.5ML ~~LOC~~ SOAJ: 0.7500 mg | SUBCUTANEOUS | 0 refills | Status: DC

## 2024-04-02 MED ORDER — BLOOD GLUCOSE TEST VI STRP: 1.0000 | ORAL_STRIP | Freq: Three times a day (TID) | 0 refills | Status: AC

## 2024-04-02 NOTE — Assessment & Plan Note (Addendum)
 Lab Results  Component Value Date   HGBA1C 7.1 (A) 04/02/2024   A1c 7.1, indicating uncontrolled diabetes. Dietary challenges due to food insecurity and high starch intake.  Interested in an injectable medication - Ordered Zepbound  2.5 mg once weekly for obesity and severe sleep apnea , this will also help with blood sugar control - Ordered glucometer for home blood sugar monitoring.  CBG goals discussed - Advised dietary modifications: reduce sugar, sweet soda, bread, pasta, and rice intake. - Encouraged moderate to vigorous exercise 30 minutes, 5 days a week. Up-to-date with diabetic eye exam records requested  Patient denies personal or family history of MTC or MEN 2.  They denied personal history of pancreatitis.  Patient encouraged to avoid fatty fried foods eat smaller portions of meal to help decrease nausea.  Encouraged to report abdominal pain, nausea, vomiting.  No recorded history of suicidal ideation

## 2024-04-02 NOTE — Assessment & Plan Note (Signed)
" °    04/02/2024   11:19 AM 10/04/2023    9:00 AM 04/27/2023    9:44 AM  GAD 7 : Generalized Anxiety Score  Nervous, Anxious, on Edge 3 3 3   Control/stop worrying 3 3 1   Worry too much - different things 3 3 3   Trouble relaxing 3 3 3   Restless 3 3 1   Easily annoyed or irritable 3 3 3   Afraid - awful might happen 3 3 3   Total GAD 7 Score 21 21 17   Anxiety Difficulty Extremely difficult Extremely difficult Somewhat difficult   Major depressive disorder and generalized anxiety disorder Significant anxiety and panic attacks, exacerbated by social interactions. Visual hallucinations on certain medications. Dissatisfaction with current psychiatric care and medication regimen. - Discussed medication regimen and concerns with psychiatrist. - Consider face-to-face psychiatric consultation for better management.   "

## 2024-04-02 NOTE — Patient Instructions (Addendum)
" ° °  Goal for fasting blood sugar ranges from 80 to 120 and 2 hours after any meal or at bedtime should be between 130 to 170.   1. Inadequate housing utilities (Primary) - Microalbumin / creatinine urine ratio - AMB Referral [facility] Care Management  2. Food insecurity - Microalbumin / creatinine urine ratio - AMB Referral [facility] Care Management  3. Anxious mood  4. Chronic obstructive pulmonary disease, unspecified COPD type (HCC)  5. Severe episode of recurrent major depressive disorder, without psychotic features (HCC)  6. 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 Diabetes mellitus without complication (HCC) - POCT glycosylated hemoglobin (Hb A1C) - Basic Metabolic Panel - Lipid panel - Blood Glucose Monitoring Suppl DEVI; 1 each by Does not apply route in the morning, at noon, and at bedtime. May substitute to any manufacturer covered by patient's insurance.  Dispense: 1 each; Refill: 0 - Glucose Blood (BLOOD GLUCOSE TEST STRIPS) STRP; 1 each by In Vitro route in the morning, at noon, and at bedtime. May substitute to any manufacturer covered by patient's insurance.  Dispense: 100 strip; Refill: 0 - Lancet Device MISC; 1 each by Does not apply route in the morning, at noon, and at bedtime. May substitute to any manufacturer covered by patient's insurance.  Dispense: 1 each; Refill: 0 - Lancets MISC; 1 each by Does not apply route as directed. Dispense based on patient and insurance preference. Use up to four times daily as directed. (FOR ICD-10 E10.9, E11.9).  Dispense: 100 each; Refill: 0 - Dulaglutide  (TRULICITY ) 0.75 MG/0.5ML SOAJ; Inject 0.75 mg into the skin once a week.  Dispense: 2 mL; Refill: 0  8. Morbid obesity with BMI of 45.0-49.9, adult (HCC)     It is important that you exercise regularly at least 30 minutes 5 times a week as tolerated  Think about what you will eat, plan ahead. Choose  clean, green, fresh or frozen over canned,  processed or packaged foods which are more sugary, salty and fatty. 70 to 75% of food eaten should be vegetables and fruit. Three meals at set times with snacks allowed between meals, but they must be fruit or vegetables. Aim to eat over a 12 hour period , example 7 am to 7 pm, and STOP after  your last meal of the day. Drink water ,generally about 64 ounces per day, no other drink is as healthy. Fruit juice is best enjoyed in a healthy way, by EATING the fruit.  Thanks for choosing [facility] we consider it a privelige to serve you.  "

## 2024-04-02 NOTE — Assessment & Plan Note (Signed)
" °  Difficulty with mobility and pain in the right ankle. - Advised on fall prevention and home safety measures.  "

## 2024-04-02 NOTE — Assessment & Plan Note (Signed)
 General health maintenance Due for cervical cancer screening. Flu shot received. - Referred to gynecologist for Pap smear. - Ensured annual eye exam is up to date

## 2024-04-02 NOTE — Assessment & Plan Note (Addendum)
" °    04/02/2024   11:20 AM 10/04/2023    8:54 AM 04/27/2023    9:44 AM  Depression screen PHQ 2/9  Decreased Interest 3 1 0  Down, Depressed, Hopeless 3 1 0  PHQ - 2 Score 6 2 0  Altered sleeping 2 3   Tired, decreased energy 3 1   Change in appetite 2 0   Feeling bad or failure about yourself  2 1   Trouble concentrating 2 2   Moving slowly or fidgety/restless 3 3   Suicidal thoughts 1 0   PHQ-9 Score 21 12    Difficult doing work/chores Extremely dIfficult Very difficult      Data saved with a previous flowsheet row definition   Major depressive disorder and generalized anxiety disorder Significant anxiety and panic attacks, exacerbated by social interactions. Visual hallucinations on certain medications. Dissatisfaction with current psychiatric care and medication regimen. - Discussed medication regimen and concerns with psychiatrist. - Consider face-to-face psychiatric consultation for better management.  "

## 2024-04-02 NOTE — Assessment & Plan Note (Signed)
 Chronic obstructive pulmonary disease Suboptimal use of Dulera  inhaler. Occasional smoking exacerbates symptoms. Smoking cessation crucial. - Instructed to use Dulera  inhaler two puffs twice daily as directed.  Continue DuoNeb 3 mL every 4 hours as needed, albuterol  inhaler 2 puffs every 6 hours as needed - Advised smoking cessation to prevent exacerbation of COPD.

## 2024-04-02 NOTE — Assessment & Plan Note (Addendum)
 On bipap Zepbound  2.5 mg once weekly prescribed, this will also help with diabetes management

## 2024-04-02 NOTE — Assessment & Plan Note (Addendum)
 Wt Readings from Last 3 Encounters:  04/02/24 256 lb (116.1 kg)  10/03/23 225 lb (102.1 kg)  10/01/23 225 lb (102.1 kg)   Body mass index is 46.08 kg/m.   Weight gain due to sedentary lifestyle and high starch diet. Trulicity  may aid in weight loss. - Initiated Zepbound  for weight loss. - Encouraged dietary changes and regular exercise.

## 2024-04-02 NOTE — Assessment & Plan Note (Signed)
 Food insecurity and inadequate housing Financial stress and difficulty affording basic needs. SSI and food assistance insufficient. - Referred to social worker for assistance with food insecurity and housing resources. - Provided information on http://[facility].info/ for community resources. Food from the clinic pantry provided

## 2024-04-02 NOTE — Assessment & Plan Note (Signed)
 Food insecurity and inadequate housing Financial stress and difficulty affording basic needs. SSI and food assistance insufficient. - Referred to social worker for assistance with food insecurity and housing resources. - Provided information on http://[facility].info/ for community resources.  General health maintenance Due for cervical cancer screening. Flu shot received. - Referred to gynecologist for Pap smear. - Ensured annual eye exam is up to date

## 2024-04-02 NOTE — Progress Notes (Signed)
 "  Established Patient Office Visit  Subjective:  Patient ID: Michele [name], female    DOB: 02/12/1971  Age: 54 y.o. MRN: [id number]  CC:  Chief Complaint  Patient presents with   Diabetes    HPI   Discussed the use of AI scribe software for clinical note transcription with the patient, who gave verbal consent to proceed.  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 who presents for a checkup.  She experiences worsening anxiety and depression, leading to social withdrawal and increased irritability. Panic attacks occur, characterized by difficulty breathing and crying. She is dissatisfied with her psychiatric medication management due to side effects like hallucinations, leading her to stop taking most of her medications and stopped seeing her psychiatrist. She is currently taking Remeron  7.5 mg but finds it ineffective.  She has a history of COPD and uses a Dulera  inhaler, taking one puff every morning and as needed for wheezing, although it is prescribed as two puffs twice daily. She also uses albuterol  and a DuoNeb nebulizer as needed. She occasionally smokes, especially when stressed, despite her COPD diagnosis.    She recently underwent surgery for a broken ankle, which required a plate and screws. She experiences ongoing difficulty with mobility and occasional leg pain. She is unsure about the need for continued aspirin  use post-surgery.  She has a history of diabetes with a recent A1c of 7.1. She attributes this to a diet high in starches and limited financial resources, receiving $100 in food stamps monthly.  She experiences intermittent left-sided abdominal pain that stops her in  her tracks and has issues with constipation, particularly when on pain medication. She uses stool softeners to manage this.  She lives alone and reports significant financial stress, receiving $900 monthly from SSI, which is insufficient to cover her expenses. She has difficulty multitasking and increased anxiety when overwhelmed.   Assessment & Plan .        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   Asthma    COPD (chronic obstructive pulmonary disease) (HCC)    Depression    Dyspnea    Fibroid 04/07/2015   Pneumonia    Sleep apnea    Tobacco use     Past Surgical History:  Procedure Laterality Date   BILATERAL SALPINGECTOMY  04/07/2015   Procedure: BILATERAL SALPINGECTOMY with left oophorectomy, left ovarian cystectomy and removal of pelvic mass;  Surgeon: Aida DELENA Na, MD;  Location: [facility];  Service: Gynecology;;   CESAREAN SECTION     ORIF ANKLE FRACTURE Right 10/03/2023   Procedure: OPEN REDUCTION INTERNAL FIXATION (ORIF) ANKLE FRACTURE;  Surgeon: Barton Drape, MD;  Location: [facility];  Service: Orthopedics;  Laterality: Right;   SYNDESMOSIS REPAIR Right 10/03/2023   Procedure: REPAIR, SYNDESMOSIS, ANKLE;  Surgeon: Barton Drape, MD;  Location: [facility];  Service: Orthopedics;  Laterality: Right;   WISDOM TOOTH EXTRACTION      Family History  Problem Relation Age of Onset   Cancer Mother    Cervical cancer Mother    Breast cancer Maternal Aunt 39 - 6   Cancer Maternal Aunt    Breast cancer Maternal Aunt 69 -  59   Cancer Maternal Uncle    BRCA 1/2 Neg Hx     Social History   Socioeconomic History   Marital status: Divorced    Spouse name: Not on file   Number of children: 2   Years of education: Not on file   Highest education level: High school graduate  Occupational History   Not on file  Tobacco Use   Smoking status: Former    Current  packs/day: 0.50    Types: Cigarettes   Smokeless tobacco: Never  Vaping Use   Vaping status: Never Used  Substance and Sexual Activity   Alcohol use: Yes    Comment: occassional   Drug use: Yes    Comment: gumies TLC   Sexual activity: Yes    Birth control/protection: Post-menopausal  Other Topics Concern   Not on file  Social History Narrative   Lives with her boyfriend    Social Drivers of Health   Tobacco Use: Medium Risk (04/02/2024)   Patient History    Smoking Tobacco Use: Former    Smokeless Tobacco Use: Never    Passive Exposure: Not on Actuary Strain: Not on file  Food Insecurity: Food Insecurity Present (04/02/2024)   Epic    Worried About Programme Researcher, Broadcasting/film/video in the Last Year: Often true    Ran Out of Food in the Last Year: Often true  Transportation Needs: No Transportation Needs (04/02/2024)   Epic    Lack of Transportation (Medical): No    Lack of Transportation (Non-Medical): No  Physical Activity: Not on file  Stress: Not on file  Social Connections: Not on file  Intimate Partner Violence: Not At Risk (04/02/2024)   Epic    Fear of Current or Ex-Partner: No    Emotionally Abused: No    Physically Abused: No    Sexually Abused: No  Depression (PHQ2-9): High Risk (04/02/2024)   Depression (PHQ2-9)    PHQ-2 Score: 21  Alcohol Screen: Low Risk (10/04/2023)   Alcohol Screen    Last Alcohol Screening Score (AUDIT): 1  Housing: Low Risk (04/02/2024)   Epic    Unable to Pay for Housing in the Last Year: No    Number of Times Moved in the Last Year: 0    Homeless in the Last Year: No  Utilities: At Risk (04/02/2024)   Epic    Threatened with loss of utilities: Yes  Health Literacy: Not on file    Outpatient Medications Prior to Visit  Medication Sig Dispense Refill   albuterol  (VENTOLIN  HFA) 108 (90 Base) MCG/ACT inhaler Inhale 2 puffs into the lungs every 4 (four) hours as needed for wheezing or shortness of breath. 18 g 1    cyclobenzaprine  (FLEXERIL ) 10 MG tablet Take 1 tablet (10 mg total) by mouth 2 (two) times daily as needed for muscle spasms. 20 tablet 0   ipratropium-albuterol  (DUONEB) 0.5-2.5 (3) MG/3ML SOLN Take 3 mLs by nebulization every 4 (four) hours as needed. 360 mL 1   mirtazapine  (REMERON ) 7.5 MG tablet Take 1 tablet (7.5 mg total) by mouth at bedtime. 30 tablet 1   mometasone -formoterol  (DULERA ) 200-5 MCG/ACT AERO Inhale 2 puffs into the lungs 2 (two) times daily. 13 g 3   nicotine  (NICODERM CQ  - DOSED IN MG/24 HR) 7 mg/24hr patch Place 1 patch (7 mg total) onto the skin daily. 28 patch 1   busPIRone  (BUSPAR ) 10 MG tablet Take 1 tablet (10 mg total) by mouth 2 (two) times  daily. (Patient not taking: Reported on 04/02/2024) 60 tablet 1   FLUoxetine  (PROZAC ) 20 MG capsule Take 1 capsule (20 mg total) by mouth daily. (Patient not taking: Reported on 04/02/2024) 30 capsule 1   HYDROcodone -acetaminophen  (NORCO/VICODIN) 5-325 MG tablet Take 1 tablet by mouth every 4 (four) hours as needed. (Patient not taking: Reported on 04/02/2024) 15 tablet 0   hydrOXYzine  (VISTARIL ) 25 MG capsule Take 2 capsules (50 mg total) by mouth 3 (three) times daily as needed. (Patient not taking: Reported on 04/02/2024) 60 capsule 1   valACYclovir  (VALTREX ) 1000 MG tablet Take 1 tablet (1,000 mg total) by mouth 3 (three) times daily. (Patient not taking: Reported on 04/02/2024) 21 tablet 0   No facility-administered medications prior to visit.    Allergies[1]  ROS Review of Systems  Constitutional:  Negative for appetite change, chills, fatigue and fever.  HENT:  Negative for congestion, postnasal drip, rhinorrhea and sneezing.   Respiratory:  Negative for cough, shortness of breath and wheezing.   Cardiovascular:  Negative for chest pain, palpitations and leg swelling.  Gastrointestinal:  Negative for abdominal pain, constipation, nausea and vomiting.  Genitourinary:  Negative for difficulty urinating, dysuria, flank pain and  frequency.  Musculoskeletal:  Negative for back pain, joint swelling and myalgias.  Skin:  Negative for color change, pallor, rash and wound.  Neurological:  Negative for dizziness, facial asymmetry, weakness, numbness and headaches.  Psychiatric/Behavioral:  Negative for behavioral problems, confusion, self-injury and suicidal ideas.       Objective:    Physical Exam Vitals and nursing note reviewed.  Constitutional:      General: She is not in acute distress.    Appearance: Normal appearance. She is obese. She is not ill-appearing, toxic-appearing or diaphoretic.  Eyes:     General: No scleral icterus.       Right eye: No discharge.        Left eye: No discharge.     Extraocular Movements: Extraocular movements intact.     Conjunctiva/sclera: Conjunctivae normal.  Cardiovascular:     Rate and Rhythm: Normal rate and regular rhythm.     Pulses: Normal pulses.     Heart sounds: Normal heart sounds. No murmur heard.    No friction rub. No gallop.  Pulmonary:     Effort: Pulmonary effort is normal. No respiratory distress.     Breath sounds: Normal breath sounds. No stridor. No wheezing, rhonchi or rales.  Chest:     Chest wall: No tenderness.  Abdominal:     General: There is no distension.     Palpations: Abdomen is soft.     Tenderness: There is no abdominal tenderness. There is no right CVA tenderness, left CVA tenderness or guarding.  Musculoskeletal:        General: No swelling, tenderness, deformity or signs of injury.     Right lower leg: No edema.     Left lower leg: No edema.  Skin:    General: Skin is warm and dry.     Capillary Refill: Capillary refill takes less than 2 seconds.     Coloration: Skin is not jaundiced or pale.     Findings: No bruising, erythema or lesion.  Neurological:     Mental Status: She is alert and oriented to person, place, and time.     Motor: No weakness.     Gait: Gait abnormal.  Psychiatric:        Mood and Affect: Mood normal.  Behavior: Behavior normal.        Thought Content: Thought content normal.        Judgment: Judgment normal.     BP 103/70   Pulse 76   Wt 256 lb (116.1 kg)   LMP 03/20/2009   SpO2 92%   BMI 46.08 kg/m  Wt Readings from Last 3 Encounters:  04/02/24 256 lb (116.1 kg)  10/03/23 225 lb (102.1 kg)  10/01/23 225 lb (102.1 kg)    No results found for: TSH Lab Results  Component Value Date   WBC 10.9 (H) 10/03/2023   HGB 14.4 10/03/2023   HCT 44.8 10/03/2023   MCV 98.5 10/03/2023   PLT 241 10/03/2023   Lab Results  Component Value Date   NA 139 10/03/2023   K 3.7 10/03/2023   CO2 28 10/03/2023   GLUCOSE 85 10/03/2023   BUN 6 10/03/2023   CREATININE 0.61 10/03/2023   BILITOT 0.7 06/08/2023   ALKPHOS 65 06/08/2023   AST 17 06/08/2023   ALT 14 06/08/2023   PROT 8.3 (H) 06/08/2023   ALBUMIN 4.4 06/08/2023   CALCIUM  9.2 10/03/2023   ANIONGAP 11 10/03/2023   EGFR 100 03/11/2021   No results found for: CHOL No results found for: HDL No results found for: [facility] Lab Results  Component Value Date   TRIG 317 (H) 04/11/2023   No results found for: CHOLHDL Lab Results  Component Value Date   HGBA1C 7.1 (A) 04/02/2024      Assessment & Plan:   Problem List Items Addressed This Visit       Respiratory   Severe obstructive sleep apnea   On bipap Zepbound  2.5 mg once weekly prescribed, this will also help with diabetes management      Relevant Medications   tirzepatide  (ZEPBOUND ) 2.5 MG/0.5ML Pen   COPD (chronic obstructive pulmonary disease) (HCC)   Chronic obstructive pulmonary disease Suboptimal use of Dulera  inhaler. Occasional smoking exacerbates symptoms. Smoking cessation crucial. - Instructed to use Dulera  inhaler two puffs twice daily as directed.  Continue DuoNeb 3 mL every 4 hours as needed, albuterol  inhaler 2 puffs every 6 hours as needed - Advised smoking cessation to prevent exacerbation of COPD.         Endocrine   Diabetes  mellitus without complication (HCC)   Lab Results  Component Value Date   HGBA1C 7.1 (A) 04/02/2024   A1c 7.1, indicating uncontrolled diabetes. Dietary challenges due to food insecurity and high starch intake.  Interested in an injectable medication - Ordered Zepbound  2.5 mg once weekly for obesity and severe sleep apnea , this will also help with blood sugar control - Ordered glucometer for home blood sugar monitoring.  CBG goals discussed - Advised dietary modifications: reduce sugar, sweet soda, bread, pasta, and rice intake. - Encouraged moderate to vigorous exercise 30 minutes, 5 days a week. Up-to-date with diabetic eye exam records requested  Patient denies personal or family history of MTC or MEN 2.  They denied personal history of pancreatitis.  Patient encouraged to avoid fatty fried foods eat smaller portions of meal to help decrease nausea.  Encouraged to report abdominal pain, nausea, vomiting.  No recorded history of suicidal ideation       Relevant Medications   Blood Glucose Monitoring Suppl DEVI   Glucose Blood (BLOOD GLUCOSE TEST STRIPS) STRP   Lancet Device MISC   Lancets MISC   Other Relevant Orders   POCT glycosylated hemoglobin (Hb A1C) (Completed)   Basic Metabolic Panel  Lipid panel     Musculoskeletal and Integument   Closed bimalleolar fracture of right ankle    Difficulty with mobility and pain in the right ankle. - Advised on fall prevention and home safety measures.         Other   Anxious mood      04/02/2024   11:19 AM 10/04/2023    9:00 AM 04/27/2023    9:44 AM  GAD 7 : Generalized Anxiety Score  Nervous, Anxious, on Edge 3 3 3   Control/stop worrying 3 3 1   Worry too much - different things 3 3 3   Trouble relaxing 3 3 3   Restless 3 3 1   Easily annoyed or irritable 3 3 3   Afraid - awful might happen 3 3 3   Total GAD 7 Score 21 21 17   Anxiety Difficulty Extremely difficult Extremely difficult Somewhat difficult   Major depressive  disorder and generalized anxiety disorder Significant anxiety and panic attacks, exacerbated by social interactions. Visual hallucinations on certain medications. Dissatisfaction with current psychiatric care and medication regimen. - Discussed medication regimen and concerns with psychiatrist. - Consider face-to-face psychiatric consultation for better management.        Severe episode of recurrent major depressive disorder, without psychotic features (HCC)      04/02/2024   11:20 AM 10/04/2023    8:54 AM 04/27/2023    9:44 AM  Depression screen PHQ 2/9  Decreased Interest 3 1 0  Down, Depressed, Hopeless 3 1 0  PHQ - 2 Score 6 2 0  Altered sleeping 2 3   Tired, decreased energy 3 1   Change in appetite 2 0   Feeling bad or failure about yourself  2 1   Trouble concentrating 2 2   Moving slowly or fidgety/restless 3 3   Suicidal thoughts 1 0   PHQ-9 Score 21 12    Difficult doing work/chores Extremely dIfficult Very difficult      Data saved with a previous flowsheet row definition   Major depressive disorder and generalized anxiety disorder Significant anxiety and panic attacks, exacerbated by social interactions. Visual hallucinations on certain medications. Dissatisfaction with current psychiatric care and medication regimen. - Discussed medication regimen and concerns with psychiatrist. - Consider face-to-face psychiatric consultation for better management.       Food insecurity   Food insecurity and inadequate housing Financial stress and difficulty affording basic needs. SSI and food assistance insufficient. - Referred to social worker for assistance with food insecurity and housing resources. - Provided information on http://[facility].[email or web address] for community resources. Food from the clinic pantry provided       Relevant Orders   Microalbumin / creatinine urine ratio   AMB Referral VBCI Care Management   Inadequate housing utilities - Primary   Food insecurity and inadequate  housing Financial stress and difficulty affording basic needs. SSI and food assistance insufficient. - Referred to social worker for assistance with food insecurity and housing resources. - Provided information on http://[facility].[email or web address] for community resources.  General health maintenance Due for cervical cancer screening. Flu shot received. - Referred to gynecologist for Pap smear. - Ensured annual eye exam is up to date      Relevant Orders   Microalbumin / creatinine urine ratio   AMB Referral VBCI Care Management   Morbid obesity with BMI of 45.0-49.9, adult (HCC)   Wt Readings from Last 3 Encounters:  04/02/24 256 lb (116.1 kg)  10/03/23 225 lb (102.1 kg)  10/01/23 225 lb (102.1 kg)  Body mass index is 46.08 kg/m.   Weight gain due to sedentary lifestyle and high starch diet. Trulicity  may aid in weight loss. - Initiated Zepbound  for weight loss. - Encouraged dietary changes and regular exercise.       Relevant Medications   tirzepatide  (ZEPBOUND ) 2.5 MG/0.5ML Pen   Health care maintenance   General health maintenance Due for cervical cancer screening. Flu shot received. - Referred to gynecologist for Pap smear. - Ensured annual eye exam is up to date      Other Visit Diagnoses       Screening for cervical cancer       Relevant Orders   Ambulatory referral to Gynecology     Screening for lipid disorders       Relevant Orders   Lipid panel       Meds ordered this encounter  Medications   Blood Glucose Monitoring Suppl DEVI    Sig: 1 each by Does not apply route in the morning, at noon, and at bedtime. May substitute to any manufacturer covered by patient's insurance.    Dispense:  1 each    Refill:  0   Glucose Blood (BLOOD GLUCOSE TEST STRIPS) STRP    Sig: 1 each by In Vitro route in the morning, at noon, and at bedtime. May substitute to any manufacturer covered by patient's insurance.    Dispense:  100 strip    Refill:  0   Lancet Device MISC    Sig: 1 each  by Does not apply route in the morning, at noon, and at bedtime. May substitute to any manufacturer covered by patient's insurance.    Dispense:  1 each    Refill:  0   Lancets MISC    Sig: 1 each by Does not apply route as directed. Dispense based on patient and insurance preference. Use up to four times daily as directed. (FOR ICD-10 E10.9, E11.9).    Dispense:  100 each    Refill:  0   DISCONTD: Dulaglutide  (TRULICITY ) 0.75 MG/0.5ML SOAJ    Sig: Inject 0.75 mg into the skin once a week.    Dispense:  2 mL    Refill:  0   tirzepatide  (ZEPBOUND ) 2.5 MG/0.5ML Pen    Sig: Inject 2.5 mg into the skin once a week.    Dispense:  2 mL    Refill:  0    Please do not fill RX for trulicity     Follow-up: Return in about 6 weeks (around 05/14/2024).    Jana Swartzlander R Analis Distler, FNP     [1] No Known Allergies  "

## 2024-04-03 LAB — MICROALBUMIN / CREATININE URINE RATIO
Creatinine, Urine: 79.3 mg/dL
Microalb/Creat Ratio: 8 mg/g{creat} (ref 0–29)
Microalbumin, Urine: 6.5 ug/mL

## 2024-04-03 LAB — BASIC METABOLIC PANEL WITH GFR
BUN/Creatinine Ratio: 11 (ref 9–23)
BUN: 8 mg/dL (ref 6–24)
CO2: 24 mmol/L (ref 20–29)
Calcium: 9.4 mg/dL (ref 8.7–10.2)
Chloride: 97 mmol/L (ref 96–106)
Creatinine, Ser: 0.73 mg/dL (ref 0.57–1.00)
Glucose: 98 mg/dL (ref 70–99)
Potassium: 4.6 mmol/L (ref 3.5–5.2)
Sodium: 142 mmol/L (ref 134–144)
eGFR: 98 mL/min/1.73

## 2024-04-03 LAB — LIPID PANEL
Chol/HDL Ratio: 5.7 ratio — ABNORMAL HIGH (ref 0.0–4.4)
Cholesterol, Total: 297 mg/dL — ABNORMAL HIGH (ref 100–199)
HDL: 52 mg/dL
LDL Chol Calc (NIH): 199 mg/dL — ABNORMAL HIGH (ref 0–99)
Triglycerides: 238 mg/dL — ABNORMAL HIGH (ref 0–149)
VLDL Cholesterol Cal: 46 mg/dL — ABNORMAL HIGH (ref 5–40)

## 2024-04-04 ENCOUNTER — Ambulatory Visit: Payer: Self-pay | Admitting: Nurse Practitioner

## 2024-04-04 DIAGNOSIS — E785 Hyperlipidemia, unspecified: Secondary | ICD-10-CM

## 2024-04-04 MED ORDER — ATORVASTATIN CALCIUM 20 MG PO TABS: 20.0000 mg | ORAL_TABLET | Freq: Every day | ORAL | 2 refills | Status: AC

## 2024-04-07 ENCOUNTER — Other Ambulatory Visit: Payer: Self-pay

## 2024-04-07 ENCOUNTER — Telehealth: Payer: Self-pay

## 2024-04-07 ENCOUNTER — Telehealth (HOSPITAL_COMMUNITY): Admitting: Registered Nurse

## 2024-04-07 ENCOUNTER — Encounter (HOSPITAL_COMMUNITY): Payer: Self-pay | Admitting: Registered Nurse

## 2024-04-07 DIAGNOSIS — G4700 Insomnia, unspecified: Secondary | ICD-10-CM

## 2024-04-07 DIAGNOSIS — F332 Major depressive disorder, recurrent severe without psychotic features: Secondary | ICD-10-CM

## 2024-04-07 DIAGNOSIS — F411 Generalized anxiety disorder: Secondary | ICD-10-CM

## 2024-04-07 MED ORDER — BUSPIRONE HCL 10 MG PO TABS: 10.0000 mg | ORAL_TABLET | Freq: Two times a day (BID) | ORAL | 1 refills | Status: AC

## 2024-04-07 MED ORDER — FLUOXETINE HCL 20 MG PO CAPS: 20.0000 mg | ORAL_CAPSULE | Freq: Every day | ORAL | 1 refills | Status: AC

## 2024-04-07 MED ORDER — MIRTAZAPINE 7.5 MG PO TABS: 7.5000 mg | ORAL_TABLET | Freq: Every day | ORAL | 1 refills | Status: AC

## 2024-04-07 NOTE — Telephone Encounter (Signed)
 Pharmacy Patient Advocate Encounter   Received notification from CoverMyMeds that prior authorization for ZEPBOUND  is required/requested.   Insurance verification completed.   The patient is insured through [employer].   Per test claim: PA required; PA submitted to above mentioned insurance via CoverMyMeds Key/confirmation #/[id number] [facility] Status is pending
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
## 2024-04-07 NOTE — Telephone Encounter (Signed)
 Pharmacy Patient Advocate Encounter  Received notification from [employer] that Prior Authorization for zepbound  has been APPROVED from 04/07/2024 to 10/04/2024   PA #/Case ID/Reference #: [id number]

## 2024-04-07 NOTE — Patient Instructions (Signed)

## 2024-04-10 ENCOUNTER — Telehealth: Payer: Self-pay | Admitting: *Deleted

## 2024-04-10 NOTE — Progress Notes (Signed)
 Complex Care Management Note Care Guide Note  04/10/2024 Name: Michele [name] MRN: [id number] DOB: 10-19-1970   Complex Care Management Outreach Attempts: An unsuccessful telephone outreach was attempted today to offer the patient information about available complex care management services.  Follow Up Plan:  Additional outreach attempts will be made to offer the patient complex care management information and services.   Encounter Outcome:  No Answer  Harlene Satterfield  [facility]  [facility], [facility] Guide  Direct Dial: [phone]  Fax [phone]

## 2024-04-11 NOTE — Progress Notes (Signed)
 Complex Care Management Note Care Guide Note  04/11/2024 Name: Michele [name] MRN: [id number] DOB: 1970-10-25   Complex Care Management Outreach Attempts: A second unsuccessful outreach was attempted today to offer the patient with information about available complex care management services.  Follow Up Plan:  Additional outreach attempts will be made to offer the patient complex care management information and services.   Encounter Outcome:  No Answer  Michele [name]  [facility]  [facility], [facility] Guide  Direct Dial: [phone]  Fax [phone]

## 2024-04-14 NOTE — Progress Notes (Signed)
 Complex Care Management Note  Care Guide Note 04/14/2024 Name: Michele [name] MRN: [id number] DOB: 1970-08-29  Michele [name] is a 54 y.o. year old female who sees Paseda, Folashade R, FNP for primary care. I reached out to [name] by phone today to offer complex care management services.  [name] was given information about Complex Care Management services today including:   The Complex Care Management services include support from the care team which includes your Nurse Care Manager, Clinical Social Worker, or Pharmacist.  The Complex Care Management team is here to help remove barriers to the health concerns and goals most important to you. Complex Care Management services are voluntary, and the patient may decline or stop services at any time by request to their care team member.   Complex Care Management Consent Status: Patient agreed to services and verbal consent obtained.   Follow up plan:  Telephone appointment with complex care management team member scheduled for:  04/18/24 BSW and 04/23/24 LCSW   Encounter Outcome:  Patient Scheduled  Harlene Satterfield  [facility]  [facility], [facility] Guide  Direct Dial: [phone]  Fax [phone]

## 2024-04-18 ENCOUNTER — Other Ambulatory Visit: Payer: Self-pay

## 2024-04-18 NOTE — Patient Instructions (Signed)
 Visit Information  Thank you for taking time to visit with me today. Please don't hesitate to contact me if I can be of assistance to you before our next scheduled appointment.  Our next appointment is no further scheduled appointments.  Please call the care guide team at [phone] if you need to cancel or reschedule your appointment.   Following is a copy of your care plan:   Goals Addressed             This Visit's Progress    COMPLETED: BSW Goals       Current SDOH Barriers:  Limited access to food Medicare enrollment  Interventions: Patient interviewed and appropriate screenings performed Referred patient to community resources           Please call the Suicide and Crisis Lifeline: 988 call the [place]  National Suicide Prevention Lifeline: 5876121978 or TTY: 720-666-7233 TTY 601-462-9054) to talk to a trained counselor call 1-800-273-TALK (toll free, 24 hour hotline) go to [facility] [address] [place] [phone]) call 911 if you are experiencing a Mental Health or Behavioral Health Crisis or need someone to talk to.  Patient verbalized understanding of Care plan and visit instructions communicated this visit  Orlean Fey, BSW [facility]  [facility] Social Worker, [employer] Health [phone]

## 2024-04-18 NOTE — Patient Outreach (Signed)
 [facility] D[facility] of Health  Community Resource and Care Coordination Visit Note   04/18/2024  Name: SHIKARA MCAULIFFE MRN: [id number] DOB:[birth year]  Situation: Referral received for [facility] needs assessment and assistance related to Food Insecurity  Medicare enrollment . I obtained verbal consent from Patient.  Visit completed with Patient on the phone.   Background:   SDOH Interventions Today    Flowsheet Row Most Recent Value  SDOH Interventions   Food Insecurity Interventions Community Resources Provided  Utilities Interventions Community Resources Provided  Financial Strain Interventions Community Resources Provided     Assessment:   BSW outreached patient today to assess for SDOH barriers and to address Medicare-related questions. The initial purpose of the call was to assist patient with insurance concerns. Patient currently has [facility] and expressed interest in enrolling in Medicare Parts A and B. BSW reviewed eligibility guidelines and informed patient that Medicare is typically available to individuals age 70 and older, or under 66 if they have a qualifying disability or medical condition. Patient is 54 years old and does not report a qualifying condition at this time; therefore, she would not be eligible for Medicare Parts A and B currently. BSW encouraged patient to contact [employer] to review her current benefits and any additional services available through her plan.  During the call, BSW assessed for SDOH barriers. Patient reported the following concerns:  Food insecurity: Patient receives SNAP benefits and supplements food by visiting local food pantries.  Utilities: Patient stated she has previously received assistance from Social Services twice to help pay her electric bill.  Financial strain: Patient reported ongoing financial challenges.  Patient also stated that she has been receiving phone calls regarding Medicaid and Medicare enrollment  but ignores them due to concern that they may be scams. BSW provided education on common Medicare/Medicaid phone scams and encouraged patient not to provide personal information over the phone unless she initiates the call to a verified number. BSW will send patient verified contact information for Medicare (1-800-MEDICARE and Https://www.morris-vasquez.[email or web address]/) and [employer] for accurate information. BSW will send patient a resource list for food, utility, and financial assistance, along with verified insurance contact information. Patient voiced understanding and appreciation. BSW advised patient to reach out if she had further questions. BSW will close case at this time.   Goals Addressed             This Visit's Progress    COMPLETED: BSW Goals       Current SDOH Barriers:  Limited access to food Medicare enrollment  Interventions: Patient interviewed and appropriate screenings performed Referred patient to community resources           Recommendation:   attend all scheduled provider appointments call for transportation assistance at least one week before appointments ask for help if you don't understand your health insurance benefits  Follow Up Plan:   Patient has achieved all patient stated goals. [employer] will be closed. Patient has been provided contact information should new needs arise.   Orlean Fey, BSW [place]  [facility] Social Worker, [facility] [phone]

## 2024-04-23 ENCOUNTER — Other Ambulatory Visit: Payer: Self-pay

## 2024-04-24 ENCOUNTER — Other Ambulatory Visit: Payer: Self-pay

## 2024-04-24 NOTE — Patient Instructions (Addendum)
" ° ° ° °   Visit Information  Thank you for taking time to visit with me today. Please don't hesitate to contact me if I can be of assistance to you before our next scheduled appointment.  Our next appointment is by telephone on 05/08/2024 at 10:30 am. Please call the care guide team at [phone] if you need to cancel or reschedule your appointment.   Following is a copy of your care plan:   Goals Addressed             This Visit's Progress    VBCI Social Work Care Plan LCSW   On track    Problems:   Mental Health Concerns : anxiety  CSW Clinical Goal(s):  Over the next 90 days the Patient will work with Child Psychotherapist to address concerns related to anxiety by following up with the behavioral Health Agency for counseling as evidenced by Patient report at follow up phone call. Over the next 30 days LCSW will make a referral for counseling through a behavioral health agency as evidenced by chart documentation.   Interventions:  Mental Health:  Evaluation of current treatment plan related to Anxiety Active listening / Reflection utilized Discussed referral options to connect for ongoing therapy Emotional Support Provided Participation in counseling encouraged PHQ2/PHQ9 completed Provided general psycho-education for mental health needs Reviewed mental health medications and discussed importance of compliance: Please do not stop taking your medications until you have consulted with your Psychiatrist or Provider  Patient Goals/Self-Care Activities:  Collaborate with LCSW for assistance seeking counseling services.  Plan:   Telephone follow up appointment with care management team member scheduled for: 05/08/2024 at 10:30 am.        Please call 1-800-273-TALK (toll free, 24 hour hotline) go to [facility] [address] [place] [phone]) call 911 if you are experiencing a Mental Health or Behavioral Health Crisis or need someone  to talk to.  Patient verbalized understanding of Care plan and visit instructions communicated this visit  Murray Shawl, LCSW [facility], [facility] Licensed Clinical Social Worker Direct Dial : [phone]     "

## 2024-04-24 NOTE — Patient Outreach (Signed)
 Complex Care Management   Visit Note  04/23/2024  Name:  Michele [name] MRN: [id number] DOB: 10-07-1970  Situation: Referral received for Complex Care Management related to Mental/Behavioral Health diagnosis  of anxiety. I obtained verbal consent from Patient.  Visit completed with Patient  on the phone.  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 Phone call to Patient assess needs and assist as needed. Patient expressed concerns around psychiatric medications and stated that she prefers not to take them. Encouraged her to discuss with her Psychiatrist/Provider before she discontinues taking them due to potential negative side effects of abrupt discontinuation. She is open to counseling services. LCSW will assist with connecting her to a behavioral health agency for counseling.  Patient Reported Symptoms:  Cognitive Cognitive Status: Able to follow simple commands, Difficulties with attention and concentration, Alert and oriented to person, place, and time, Normal speech and language skills, Struggling with memory recall (in the last year, no longer have the concentration to multi task) Cognitive/Intellectual Conditions Management [RPT]: None reported or documented in medical history or problem list   Health Maintenance Behaviors: Annual physical exam Healing Pattern: Average Health Facilitated by: Rest  Neurological Neurological Review of Symptoms: Headaches, Dizziness (sometimes will have these synptoms - not sure why- maybe stress) Neurological Management Strategies: Routine screening Neurological Self-Management Outcome: 4 (good)  HEENT HEENT Symptoms Reported: No symptoms  reported HEENT Management Strategies: Medication therapy, Routine screening HEENT Self-Management Outcome: 4 (good)    Cardiovascular Cardiovascular Symptoms Reported: No symptoms reported Does patient have uncontrolled Hypertension?: No Cardiovascular Management Strategies: Routine screening Cardiovascular Self-Management Outcome: 4 (good)  Respiratory Respiratory Symptoms Reported: No symptoms reported Additional Respiratory Details: not since I was hospitalized last January Respiratory Management Strategies: Routine screening Respiratory Self-Management Outcome: 4 (good)  Endocrine Endocrine Symptoms Reported: Unintentional weight gain Is patient diabetic?: Yes Is patient checking blood sugars at home?: Yes List most recent blood sugar readings, include date and time of day: 2-3 times a day - but not today Endocrine Self-Management Outcome: 4 (good) Endocrine Comment: Patient feels that her blood sugar became an issue only when she started taking certain medications (for mood and anxiety)  Gastrointestinal Gastrointestinal Symptoms Reported: Abdominal pain or discomfort Additional Gastrointestinal Details: sometimes will have a sharp pain depending on how she is positioned Gastrointestinal Management Strategies: Adequate rest    Genitourinary Genitourinary Symptoms Reported: No symptoms reported Genitourinary Management Strategies: Adequate rest Genitourinary Self-Management Outcome: 4 (good)  Integumentary Integumentary Symptoms Reported: No symptoms reported Skin Management Strategies: Routine screening Skin Self-Management Outcome: 4 (good)  Musculoskeletal Musculoskelatal Symptoms Reviewed: Back pain Additional Musculoskeletal Details: lower back - pain off and on Musculoskeletal Management Strategies: Adequate rest, Routine screening, Medication therapy Musculoskeletal Self-Management Outcome: 4 (good)      Psychosocial Psychosocial Symptoms Reported: Anxiety - if selected  complete GAD, Depression - if selected complete PHQ 2-9     Quality of Family Relationships: helpful, supportive Do you feel physically threatened by others?: No    04/24/2024    PHQ2-9 Depression Screening   Little interest or pleasure in doing things Nearly every day  Feeling down, depressed,  or hopeless Nearly every day  PHQ-2 - Total Score 6  Trouble falling or staying asleep, or sleeping too much Nearly every day (all of them)  Feeling tired or having little energy Nearly every day  Poor appetite or overeating  Not at all (i eat when i am hungry- average appetite)  Feeling bad about yourself - or that you are a failure or have let yourself or your family down    Trouble concentrating on things, such as reading the newspaper or watching television More than half the days  Moving or speaking so slowly that other people could have noticed.  Or the opposite - being so fidgety or restless that you have been moving around a lot more than usual Several days (somtimes feels restless)  Thoughts that you would be better off dead, or hurting yourself in some way Not at all (i dont want to be dead- i just want to feel normal)  PHQ2-9 Total Score 15  If you checked off any problems, how difficult have these problems made it for you to do your work, take care of things at home, or get along with other people Extremely dIfficult  Depression Interventions/Treatment      There were no vitals filed for this visit. Pain Scale: 0-10 Pain Score: 0-No pain Pain Type: Chronic pain (ankle pain off and on) Pain Onset: On-going  Medications Reviewed Today     Reviewed by Angelena Finis HERO, LCSW (Social Worker) on 04/24/24 at 1253  Med List Status: <None>   Medication Order Taking? Sig Documenting Provider Last Dose Status Informant  albuterol  (VENTOLIN  HFA) 108 (90 Base) MCG/ACT inhaler [id number] Yes Inhale 2 puffs into the lungs every 4 (four) hours as needed for wheezing or shortness of breath.  Paseda, Folashade R, FNP  Active   atorvastatin  (LIPITOR) 20 MG tablet [id number] Yes Take 1 tablet (20 mg total) by mouth daily. Paseda, Folashade R, FNP  Active   Blood Glucose Monitoring Suppl DEVI [id number] Yes 1 each by Does not apply route in the morning, at noon, and at bedtime. May substitute to any manufacturer covered by patient's insurance. Paseda, Folashade R, FNP  Active   busPIRone  (BUSPAR ) 10 MG tablet [id number] Yes Take 1 tablet (10 mg total) by mouth 2 (two) times daily. Rankin, Shuvon B, NP  Active   cyclobenzaprine  (FLEXERIL ) 10 MG tablet [id number] Yes Take 1 tablet (10 mg total) by mouth 2 (two) times daily as needed for muscle spasms. Elnor Jayson LABOR, DO  Active   FLUoxetine  (PROZAC ) 20 MG capsule [id number] Yes Take 1 capsule (20 mg total) by mouth daily. Rankin, Shuvon B, NP  Active   Glucose Blood (BLOOD GLUCOSE TEST STRIPS) STRP [id number] Yes 1 each by In Vitro route in the morning, at noon, and at bedtime. May substitute to any manufacturer covered by patient's insurance. Paseda, Folashade R, FNP  Active   HYDROcodone -acetaminophen  (NORCO/VICODIN) 5-325 MG tablet [id number]  Take 1 tablet by mouth every 4 (four) hours as needed.  Patient not taking: Reported on 04/23/2024   Elnor Jayson A, DO  Active   hydrOXYzine  (VISTARIL ) 25 MG capsule [id number]  Take 2 capsules (50 mg total) by mouth 3 (three) times daily as needed.  Patient not taking: Reported on 04/23/2024   Rankin, Shuvon B, NP  Active   ipratropium-albuterol  (DUONEB) 0.5-2.5 (3) MG/3ML SOLN [id number] Yes Take 3 mLs by nebulization every 4 (four) hours as needed. Paseda, Folashade R, FNP  Active   Lancet Device MISC  [id number] Yes 1 each by Does not apply route in the morning, at noon, and at bedtime. May substitute to any manufacturer covered by patient's insurance. Paseda, Folashade R, FNP  Active   Lancets MISC [id number] Yes 1 each by Does not apply route as directed. Dispense based on patient and insurance preference. Use  up to four times daily as directed. (FOR ICD-10 E10.9, E11.9). Paseda, Folashade R, FNP  Active   mirtazapine  (REMERON ) 7.5 MG tablet [id number] Yes Take 1 tablet (7.5 mg total) by mouth at bedtime. Rankin, Shuvon B, NP  Active   mometasone -formoterol  (DULERA ) 200-5 MCG/ACT AERO [id number] Yes Inhale 2 puffs into the lungs 2 (two) times daily. Paseda, Folashade R, FNP  Active   nicotine  (NICODERM CQ  - DOSED IN MG/24 HR) 7 mg/24hr patch [id number] Yes Place 1 patch (7 mg total) onto the skin daily. Paseda, Folashade R, FNP  Active   tirzepatide  (ZEPBOUND ) 2.5 MG/0.5ML Pen [id number] Yes Inject 2.5 mg into the skin once a week. Paseda, Folashade R, FNP  Active   valACYclovir  (VALTREX ) 1000 MG tablet [id number]  Take 1 tablet (1,000 mg total) by mouth 3 (three) times daily.  Patient not taking: Reported on 04/23/2024   Kingsley, Victoria K, DO  Active             Recommendation:   PCP Follow-up Specialty provider follow-up as needed Counseling   Follow Up Plan:   Telephone follow up appointment date/time:  05/08/2024 at 10:30 am.  Murray Shawl, LCSW [facility], [facility] Licensed Clinical Social Worker Direct Dial : [phone]

## 2024-05-08 ENCOUNTER — Telehealth

## 2024-05-14 ENCOUNTER — Ambulatory Visit: Payer: Self-pay | Admitting: Nurse Practitioner
